# Patient Record
Sex: Female | Born: 1955 | Race: White | Hispanic: No | Smoking: Former smoker
Health system: Southern US, Community
[De-identification: ages and names within clinical notes are randomized; demographics above are authoritative.]

## PROBLEM LIST (undated history)

## (undated) DIAGNOSIS — K635 Polyp of colon: Secondary | ICD-10-CM

## (undated) DIAGNOSIS — G4733 Obstructive sleep apnea (adult) (pediatric): Secondary | ICD-10-CM

## (undated) DIAGNOSIS — G4730 Sleep apnea, unspecified: Secondary | ICD-10-CM

## (undated) DIAGNOSIS — K219 Gastro-esophageal reflux disease without esophagitis: Secondary | ICD-10-CM

## (undated) DIAGNOSIS — G8929 Other chronic pain: Secondary | ICD-10-CM

## (undated) DIAGNOSIS — M1990 Unspecified osteoarthritis, unspecified site: Secondary | ICD-10-CM

## (undated) DIAGNOSIS — B711 Dipylidiasis: Secondary | ICD-10-CM

## (undated) DIAGNOSIS — F419 Anxiety disorder, unspecified: Secondary | ICD-10-CM

## (undated) DIAGNOSIS — E785 Hyperlipidemia, unspecified: Secondary | ICD-10-CM

## (undated) DIAGNOSIS — A071 Giardiasis [lambliasis]: Secondary | ICD-10-CM

## (undated) HISTORY — DX: Gastro-esophageal reflux disease without esophagitis: K21.9

## (undated) HISTORY — DX: Giardiasis (lambliasis): A07.1

## (undated) HISTORY — DX: Anxiety disorder, unspecified: F41.9

## (undated) HISTORY — DX: Dipylidiasis: B71.1

## (undated) HISTORY — PX: REDUCTION MAMMAPLASTY: SUR839

## (undated) HISTORY — PX: LITHOTRIPSY: SUR834

## (undated) HISTORY — DX: Hyperlipidemia, unspecified: E78.5

---

## 1997-11-18 HISTORY — PX: ABDOMINAL HYSTERECTOMY: SHX81

## 2002-06-18 ENCOUNTER — Encounter: Admission: RE | Admit: 2002-06-18 | Discharge: 2002-08-17 | Payer: Self-pay

## 2002-11-18 HISTORY — PX: CHOLECYSTECTOMY: SHX55

## 2004-08-23 ENCOUNTER — Ambulatory Visit: Payer: Self-pay | Admitting: Physician Assistant

## 2004-08-24 ENCOUNTER — Ambulatory Visit: Payer: Self-pay | Admitting: Pain Medicine

## 2004-09-04 ENCOUNTER — Ambulatory Visit: Payer: Self-pay | Admitting: Physician Assistant

## 2004-09-14 ENCOUNTER — Encounter: Payer: Self-pay | Admitting: Pain Medicine

## 2004-09-18 ENCOUNTER — Encounter: Payer: Self-pay | Admitting: Pain Medicine

## 2004-10-02 ENCOUNTER — Ambulatory Visit: Payer: Self-pay | Admitting: Pain Medicine

## 2004-10-09 ENCOUNTER — Ambulatory Visit: Payer: Self-pay | Admitting: Unknown Physician Specialty

## 2004-10-23 ENCOUNTER — Ambulatory Visit: Payer: Self-pay | Admitting: Pain Medicine

## 2004-11-07 ENCOUNTER — Ambulatory Visit: Payer: Self-pay | Admitting: Physician Assistant

## 2004-12-07 ENCOUNTER — Ambulatory Visit: Payer: Self-pay | Admitting: Unknown Physician Specialty

## 2004-12-10 ENCOUNTER — Ambulatory Visit: Payer: Self-pay | Admitting: Physician Assistant

## 2004-12-14 ENCOUNTER — Ambulatory Visit: Payer: Self-pay | Admitting: Unknown Physician Specialty

## 2005-01-10 ENCOUNTER — Ambulatory Visit: Payer: Self-pay | Admitting: Physician Assistant

## 2005-01-15 ENCOUNTER — Ambulatory Visit: Payer: Self-pay | Admitting: Urology

## 2005-02-08 ENCOUNTER — Ambulatory Visit: Payer: Self-pay | Admitting: Physician Assistant

## 2005-02-28 ENCOUNTER — Ambulatory Visit: Payer: Self-pay | Admitting: Physician Assistant

## 2005-03-14 ENCOUNTER — Ambulatory Visit: Payer: Self-pay | Admitting: Pain Medicine

## 2005-03-29 ENCOUNTER — Ambulatory Visit: Payer: Self-pay | Admitting: Physician Assistant

## 2005-04-09 ENCOUNTER — Ambulatory Visit: Payer: Self-pay | Admitting: Pain Medicine

## 2005-04-18 ENCOUNTER — Ambulatory Visit: Payer: Self-pay | Admitting: Pain Medicine

## 2005-04-23 ENCOUNTER — Ambulatory Visit: Payer: Self-pay | Admitting: Pain Medicine

## 2005-05-03 ENCOUNTER — Ambulatory Visit: Payer: Self-pay | Admitting: Physician Assistant

## 2005-05-14 ENCOUNTER — Ambulatory Visit: Payer: Self-pay | Admitting: Pain Medicine

## 2005-06-06 ENCOUNTER — Ambulatory Visit: Payer: Self-pay | Admitting: Physician Assistant

## 2005-07-09 ENCOUNTER — Ambulatory Visit: Payer: Self-pay | Admitting: Physician Assistant

## 2005-08-08 ENCOUNTER — Ambulatory Visit: Payer: Self-pay | Admitting: Physician Assistant

## 2005-09-10 ENCOUNTER — Ambulatory Visit: Payer: Self-pay | Admitting: Physician Assistant

## 2005-10-08 ENCOUNTER — Ambulatory Visit: Payer: Self-pay | Admitting: Physician Assistant

## 2005-10-24 ENCOUNTER — Ambulatory Visit: Payer: Self-pay | Admitting: Pain Medicine

## 2005-11-06 ENCOUNTER — Ambulatory Visit: Payer: Self-pay | Admitting: Physician Assistant

## 2005-11-21 ENCOUNTER — Ambulatory Visit: Payer: Self-pay | Admitting: Pain Medicine

## 2005-12-10 ENCOUNTER — Ambulatory Visit: Payer: Self-pay | Admitting: Pain Medicine

## 2006-01-07 ENCOUNTER — Ambulatory Visit: Payer: Self-pay | Admitting: Physician Assistant

## 2006-02-07 ENCOUNTER — Ambulatory Visit: Payer: Self-pay | Admitting: Physician Assistant

## 2006-03-10 ENCOUNTER — Ambulatory Visit: Payer: Self-pay | Admitting: Physician Assistant

## 2006-03-15 ENCOUNTER — Ambulatory Visit: Payer: Self-pay | Admitting: Physician Assistant

## 2006-04-04 LAB — HM DEXA SCAN

## 2006-04-07 ENCOUNTER — Ambulatory Visit: Payer: Self-pay | Admitting: Physician Assistant

## 2006-05-29 ENCOUNTER — Ambulatory Visit: Payer: Self-pay | Admitting: Physician Assistant

## 2006-06-10 ENCOUNTER — Ambulatory Visit: Payer: Self-pay | Admitting: Physician Assistant

## 2006-07-10 ENCOUNTER — Ambulatory Visit: Payer: Self-pay | Admitting: Physician Assistant

## 2006-08-11 ENCOUNTER — Ambulatory Visit: Payer: Self-pay | Admitting: Physician Assistant

## 2006-08-18 ENCOUNTER — Encounter: Admission: RE | Admit: 2006-08-18 | Discharge: 2006-08-18 | Payer: Self-pay | Admitting: Neurological Surgery

## 2006-09-08 ENCOUNTER — Ambulatory Visit: Payer: Self-pay | Admitting: Physician Assistant

## 2006-10-08 ENCOUNTER — Ambulatory Visit: Payer: Self-pay | Admitting: Physician Assistant

## 2006-11-04 ENCOUNTER — Ambulatory Visit: Payer: Self-pay | Admitting: Physician Assistant

## 2006-12-08 ENCOUNTER — Ambulatory Visit: Payer: Self-pay | Admitting: Physician Assistant

## 2006-12-23 ENCOUNTER — Ambulatory Visit: Payer: Self-pay | Admitting: Pain Medicine

## 2007-01-06 ENCOUNTER — Ambulatory Visit: Payer: Self-pay | Admitting: Physician Assistant

## 2007-01-13 ENCOUNTER — Ambulatory Visit: Payer: Self-pay | Admitting: Pain Medicine

## 2007-02-04 ENCOUNTER — Ambulatory Visit: Payer: Self-pay | Admitting: Physician Assistant

## 2007-02-17 ENCOUNTER — Ambulatory Visit: Payer: Self-pay | Admitting: Pain Medicine

## 2007-03-03 ENCOUNTER — Ambulatory Visit: Payer: Self-pay | Admitting: Pain Medicine

## 2007-04-01 ENCOUNTER — Ambulatory Visit: Payer: Self-pay | Admitting: Physician Assistant

## 2007-05-11 ENCOUNTER — Ambulatory Visit: Payer: Self-pay | Admitting: Physician Assistant

## 2007-06-09 ENCOUNTER — Ambulatory Visit: Payer: Self-pay | Admitting: Physician Assistant

## 2007-07-08 ENCOUNTER — Ambulatory Visit: Payer: Self-pay | Admitting: Physician Assistant

## 2007-08-10 ENCOUNTER — Ambulatory Visit: Payer: Self-pay | Admitting: Physician Assistant

## 2007-09-07 ENCOUNTER — Ambulatory Visit: Payer: Self-pay | Admitting: Unknown Physician Specialty

## 2007-09-10 ENCOUNTER — Ambulatory Visit: Payer: Self-pay | Admitting: Physician Assistant

## 2007-10-08 ENCOUNTER — Ambulatory Visit: Payer: Self-pay | Admitting: Physician Assistant

## 2007-11-05 ENCOUNTER — Ambulatory Visit: Payer: Self-pay | Admitting: Physician Assistant

## 2007-12-10 ENCOUNTER — Ambulatory Visit: Payer: Self-pay | Admitting: Physician Assistant

## 2008-02-04 ENCOUNTER — Ambulatory Visit: Payer: Self-pay | Admitting: Physician Assistant

## 2008-05-05 ENCOUNTER — Ambulatory Visit: Payer: Self-pay | Admitting: Physician Assistant

## 2008-07-27 ENCOUNTER — Ambulatory Visit: Payer: Self-pay | Admitting: Physician Assistant

## 2008-09-15 ENCOUNTER — Ambulatory Visit: Payer: Self-pay | Admitting: Physician Assistant

## 2008-10-12 ENCOUNTER — Ambulatory Visit: Payer: Self-pay | Admitting: Physician Assistant

## 2008-10-16 ENCOUNTER — Emergency Department: Payer: Self-pay | Admitting: Emergency Medicine

## 2008-10-25 ENCOUNTER — Ambulatory Visit: Payer: Self-pay | Admitting: Physician Assistant

## 2008-11-08 ENCOUNTER — Ambulatory Visit: Payer: Self-pay | Admitting: Pain Medicine

## 2008-12-01 ENCOUNTER — Ambulatory Visit: Payer: Self-pay | Admitting: Pain Medicine

## 2008-12-07 ENCOUNTER — Ambulatory Visit: Payer: Self-pay | Admitting: Physician Assistant

## 2009-03-01 ENCOUNTER — Ambulatory Visit: Payer: Self-pay | Admitting: Physician Assistant

## 2009-05-12 ENCOUNTER — Ambulatory Visit: Payer: Self-pay | Admitting: Pain Medicine

## 2009-09-05 ENCOUNTER — Ambulatory Visit: Payer: Self-pay | Admitting: Physician Assistant

## 2009-09-27 ENCOUNTER — Ambulatory Visit: Payer: Self-pay

## 2009-11-01 ENCOUNTER — Ambulatory Visit: Payer: Self-pay | Admitting: Physician Assistant

## 2009-12-07 ENCOUNTER — Ambulatory Visit: Payer: Self-pay | Admitting: Physician Assistant

## 2010-01-22 ENCOUNTER — Ambulatory Visit: Payer: Self-pay | Admitting: Pain Medicine

## 2010-02-01 ENCOUNTER — Ambulatory Visit: Payer: Self-pay | Admitting: Pain Medicine

## 2010-02-19 ENCOUNTER — Ambulatory Visit: Payer: Self-pay | Admitting: Pain Medicine

## 2010-06-25 ENCOUNTER — Ambulatory Visit: Payer: Self-pay | Admitting: Otolaryngology

## 2010-07-02 ENCOUNTER — Ambulatory Visit: Payer: Self-pay | Admitting: Otolaryngology

## 2010-11-29 ENCOUNTER — Ambulatory Visit: Payer: Self-pay | Admitting: Family Medicine

## 2011-12-10 ENCOUNTER — Ambulatory Visit: Payer: Self-pay | Admitting: Otolaryngology

## 2011-12-10 DIAGNOSIS — G4737 Central sleep apnea in conditions classified elsewhere: Secondary | ICD-10-CM | POA: Diagnosis not present

## 2011-12-10 DIAGNOSIS — G4700 Insomnia, unspecified: Secondary | ICD-10-CM | POA: Diagnosis not present

## 2011-12-10 DIAGNOSIS — G4733 Obstructive sleep apnea (adult) (pediatric): Secondary | ICD-10-CM | POA: Diagnosis not present

## 2011-12-24 ENCOUNTER — Ambulatory Visit: Payer: Self-pay | Admitting: Family Medicine

## 2011-12-24 DIAGNOSIS — Z1231 Encounter for screening mammogram for malignant neoplasm of breast: Secondary | ICD-10-CM | POA: Diagnosis not present

## 2012-01-23 DIAGNOSIS — J111 Influenza due to unidentified influenza virus with other respiratory manifestations: Secondary | ICD-10-CM | POA: Diagnosis not present

## 2012-02-05 ENCOUNTER — Ambulatory Visit: Payer: Self-pay | Admitting: Family Medicine

## 2012-02-05 DIAGNOSIS — R05 Cough: Secondary | ICD-10-CM | POA: Diagnosis not present

## 2012-02-05 DIAGNOSIS — Z0000 Encounter for general adult medical examination without abnormal findings: Secondary | ICD-10-CM | POA: Diagnosis not present

## 2012-02-05 DIAGNOSIS — R5381 Other malaise: Secondary | ICD-10-CM | POA: Diagnosis not present

## 2012-02-05 DIAGNOSIS — E785 Hyperlipidemia, unspecified: Secondary | ICD-10-CM | POA: Diagnosis not present

## 2012-02-05 DIAGNOSIS — J209 Acute bronchitis, unspecified: Secondary | ICD-10-CM | POA: Diagnosis not present

## 2012-02-05 DIAGNOSIS — R5383 Other fatigue: Secondary | ICD-10-CM | POA: Diagnosis not present

## 2012-02-05 DIAGNOSIS — R319 Hematuria, unspecified: Secondary | ICD-10-CM | POA: Diagnosis not present

## 2012-02-05 DIAGNOSIS — R059 Cough, unspecified: Secondary | ICD-10-CM | POA: Diagnosis not present

## 2012-02-05 DIAGNOSIS — M549 Dorsalgia, unspecified: Secondary | ICD-10-CM | POA: Diagnosis not present

## 2012-04-17 DIAGNOSIS — R7401 Elevation of levels of liver transaminase levels: Secondary | ICD-10-CM | POA: Diagnosis not present

## 2012-05-04 DIAGNOSIS — M545 Low back pain, unspecified: Secondary | ICD-10-CM | POA: Diagnosis not present

## 2012-05-04 DIAGNOSIS — Z0000 Encounter for general adult medical examination without abnormal findings: Secondary | ICD-10-CM | POA: Diagnosis not present

## 2012-05-04 DIAGNOSIS — G4730 Sleep apnea, unspecified: Secondary | ICD-10-CM | POA: Diagnosis not present

## 2012-05-04 DIAGNOSIS — M549 Dorsalgia, unspecified: Secondary | ICD-10-CM | POA: Diagnosis not present

## 2012-05-08 DIAGNOSIS — G4737 Central sleep apnea in conditions classified elsewhere: Secondary | ICD-10-CM | POA: Diagnosis not present

## 2012-05-08 DIAGNOSIS — G4733 Obstructive sleep apnea (adult) (pediatric): Secondary | ICD-10-CM | POA: Diagnosis not present

## 2012-05-08 DIAGNOSIS — D1039 Benign neoplasm of other parts of mouth: Secondary | ICD-10-CM | POA: Diagnosis not present

## 2012-05-08 DIAGNOSIS — K1370 Unspecified lesions of oral mucosa: Secondary | ICD-10-CM | POA: Diagnosis not present

## 2012-05-08 DIAGNOSIS — G4700 Insomnia, unspecified: Secondary | ICD-10-CM | POA: Diagnosis not present

## 2012-07-23 DIAGNOSIS — J342 Deviated nasal septum: Secondary | ICD-10-CM | POA: Diagnosis not present

## 2012-07-23 DIAGNOSIS — J343 Hypertrophy of nasal turbinates: Secondary | ICD-10-CM | POA: Diagnosis not present

## 2012-07-23 DIAGNOSIS — G4733 Obstructive sleep apnea (adult) (pediatric): Secondary | ICD-10-CM | POA: Diagnosis not present

## 2012-08-03 DIAGNOSIS — F329 Major depressive disorder, single episode, unspecified: Secondary | ICD-10-CM | POA: Diagnosis not present

## 2012-08-03 DIAGNOSIS — M545 Low back pain, unspecified: Secondary | ICD-10-CM | POA: Diagnosis not present

## 2012-08-03 DIAGNOSIS — F3289 Other specified depressive episodes: Secondary | ICD-10-CM | POA: Diagnosis not present

## 2012-08-03 DIAGNOSIS — F411 Generalized anxiety disorder: Secondary | ICD-10-CM | POA: Diagnosis not present

## 2012-08-03 DIAGNOSIS — Z23 Encounter for immunization: Secondary | ICD-10-CM | POA: Diagnosis not present

## 2012-08-03 DIAGNOSIS — Z0000 Encounter for general adult medical examination without abnormal findings: Secondary | ICD-10-CM | POA: Diagnosis not present

## 2012-11-27 DIAGNOSIS — Z23 Encounter for immunization: Secondary | ICD-10-CM | POA: Diagnosis not present

## 2012-11-27 DIAGNOSIS — R259 Unspecified abnormal involuntary movements: Secondary | ICD-10-CM | POA: Diagnosis not present

## 2012-11-27 DIAGNOSIS — F411 Generalized anxiety disorder: Secondary | ICD-10-CM | POA: Diagnosis not present

## 2012-11-27 DIAGNOSIS — M549 Dorsalgia, unspecified: Secondary | ICD-10-CM | POA: Diagnosis not present

## 2012-11-30 ENCOUNTER — Other Ambulatory Visit: Payer: Self-pay | Admitting: Family Medicine

## 2012-11-30 DIAGNOSIS — K219 Gastro-esophageal reflux disease without esophagitis: Secondary | ICD-10-CM | POA: Diagnosis not present

## 2012-11-30 DIAGNOSIS — F411 Generalized anxiety disorder: Secondary | ICD-10-CM | POA: Diagnosis not present

## 2012-11-30 DIAGNOSIS — E162 Hypoglycemia, unspecified: Secondary | ICD-10-CM | POA: Diagnosis not present

## 2012-11-30 LAB — COMPREHENSIVE METABOLIC PANEL
Albumin: 3.6 g/dL (ref 3.4–5.0)
Alkaline Phosphatase: 69 U/L (ref 50–136)
Anion Gap: 5 — ABNORMAL LOW (ref 7–16)
BUN: 11 mg/dL (ref 7–18)
Bilirubin,Total: 0.3 mg/dL (ref 0.2–1.0)
Calcium, Total: 8.8 mg/dL (ref 8.5–10.1)
Chloride: 110 mmol/L — ABNORMAL HIGH (ref 98–107)
Co2: 29 mmol/L (ref 21–32)
Creatinine: 0.75 mg/dL (ref 0.60–1.30)
EGFR (African American): >60
EGFR (Non-African Amer.): >60
Glucose: 87 mg/dL (ref 65–99)
Osmolality: 286 (ref 275–301)
Potassium: 4.5 mmol/L (ref 3.5–5.1)
SGOT(AST): 22 U/L (ref 15–37)
SGPT (ALT): 24 U/L (ref 12–78)
Sodium: 144 mmol/L (ref 136–145)
Total Protein: 7 g/dL (ref 6.4–8.2)

## 2012-11-30 LAB — CBC WITH DIFFERENTIAL/PLATELET
Basophil #: 0.1 10*3/uL (ref 0.0–0.1)
Basophil %: 0.9 %
Eosinophil #: 0.1 10*3/uL (ref 0.0–0.7)
Eosinophil %: 1.9 %
HCT: 40.6 % (ref 35.0–47.0)
HGB: 13.5 g/dL (ref 12.0–16.0)
Lymphocyte #: 2 10*3/uL (ref 1.0–3.6)
Lymphocyte %: 30.6 %
MCH: 29.8 pg (ref 26.0–34.0)
MCHC: 33.3 g/dL (ref 32.0–36.0)
MCV: 89 fL (ref 80–100)
Monocyte #: 0.4 x10 3/mm (ref 0.2–0.9)
Monocyte %: 6.4 %
Neutrophil #: 3.9 10*3/uL (ref 1.4–6.5)
Neutrophil %: 60.2 %
Platelet: 148 10*3/uL — ABNORMAL LOW (ref 150–440)
RBC: 4.54 10*6/uL (ref 3.80–5.20)
RDW: 12.8 % (ref 11.5–14.5)
WBC: 6.6 10*3/uL (ref 3.6–11.0)

## 2012-11-30 LAB — HEMOGLOBIN A1C: Hemoglobin A1C: 5.8 % (ref 4.2–6.3)

## 2012-11-30 LAB — TSH: Thyroid Stimulating Horm: 1.55 u[IU]/mL

## 2012-12-14 ENCOUNTER — Ambulatory Visit: Payer: Self-pay | Admitting: Unknown Physician Specialty

## 2012-12-14 DIAGNOSIS — F329 Major depressive disorder, single episode, unspecified: Secondary | ICD-10-CM | POA: Diagnosis not present

## 2012-12-14 DIAGNOSIS — Z860100 Personal history of colon polyps, unspecified: Secondary | ICD-10-CM | POA: Diagnosis not present

## 2012-12-14 DIAGNOSIS — G4730 Sleep apnea, unspecified: Secondary | ICD-10-CM | POA: Diagnosis not present

## 2012-12-14 DIAGNOSIS — G8929 Other chronic pain: Secondary | ICD-10-CM | POA: Diagnosis not present

## 2012-12-14 DIAGNOSIS — M549 Dorsalgia, unspecified: Secondary | ICD-10-CM | POA: Diagnosis not present

## 2012-12-14 DIAGNOSIS — K648 Other hemorrhoids: Secondary | ICD-10-CM | POA: Diagnosis not present

## 2012-12-14 DIAGNOSIS — F3289 Other specified depressive episodes: Secondary | ICD-10-CM | POA: Diagnosis not present

## 2012-12-14 DIAGNOSIS — Z885 Allergy status to narcotic agent status: Secondary | ICD-10-CM | POA: Diagnosis not present

## 2012-12-14 DIAGNOSIS — Z79899 Other long term (current) drug therapy: Secondary | ICD-10-CM | POA: Diagnosis not present

## 2012-12-14 DIAGNOSIS — Z888 Allergy status to other drugs, medicaments and biological substances status: Secondary | ICD-10-CM | POA: Diagnosis not present

## 2012-12-14 DIAGNOSIS — Z09 Encounter for follow-up examination after completed treatment for conditions other than malignant neoplasm: Secondary | ICD-10-CM | POA: Diagnosis not present

## 2012-12-14 LAB — HM COLONOSCOPY

## 2013-02-15 ENCOUNTER — Ambulatory Visit: Payer: Self-pay | Admitting: Urology

## 2013-02-15 DIAGNOSIS — Q638 Other specified congenital malformations of kidney: Secondary | ICD-10-CM | POA: Diagnosis not present

## 2013-02-15 DIAGNOSIS — N200 Calculus of kidney: Secondary | ICD-10-CM | POA: Diagnosis not present

## 2013-02-15 DIAGNOSIS — M545 Low back pain, unspecified: Secondary | ICD-10-CM | POA: Diagnosis not present

## 2013-02-15 DIAGNOSIS — N201 Calculus of ureter: Secondary | ICD-10-CM | POA: Diagnosis not present

## 2013-02-16 ENCOUNTER — Ambulatory Visit: Payer: Self-pay | Admitting: Urology

## 2013-02-16 DIAGNOSIS — Q638 Other specified congenital malformations of kidney: Secondary | ICD-10-CM | POA: Diagnosis not present

## 2013-02-16 DIAGNOSIS — R319 Hematuria, unspecified: Secondary | ICD-10-CM | POA: Diagnosis not present

## 2013-02-26 DIAGNOSIS — M545 Low back pain, unspecified: Secondary | ICD-10-CM | POA: Diagnosis not present

## 2013-02-26 DIAGNOSIS — E119 Type 2 diabetes mellitus without complications: Secondary | ICD-10-CM | POA: Diagnosis not present

## 2013-02-26 DIAGNOSIS — E7800 Pure hypercholesterolemia, unspecified: Secondary | ICD-10-CM | POA: Diagnosis not present

## 2013-02-26 DIAGNOSIS — Z23 Encounter for immunization: Secondary | ICD-10-CM | POA: Diagnosis not present

## 2013-04-09 DIAGNOSIS — E785 Hyperlipidemia, unspecified: Secondary | ICD-10-CM | POA: Diagnosis not present

## 2013-04-09 DIAGNOSIS — M545 Low back pain, unspecified: Secondary | ICD-10-CM | POA: Diagnosis not present

## 2013-04-09 DIAGNOSIS — E119 Type 2 diabetes mellitus without complications: Secondary | ICD-10-CM | POA: Diagnosis not present

## 2013-04-09 DIAGNOSIS — R21 Rash and other nonspecific skin eruption: Secondary | ICD-10-CM | POA: Diagnosis not present

## 2013-04-09 DIAGNOSIS — Z23 Encounter for immunization: Secondary | ICD-10-CM | POA: Diagnosis not present

## 2013-05-24 DIAGNOSIS — F3289 Other specified depressive episodes: Secondary | ICD-10-CM | POA: Diagnosis not present

## 2013-05-24 DIAGNOSIS — L989 Disorder of the skin and subcutaneous tissue, unspecified: Secondary | ICD-10-CM | POA: Diagnosis not present

## 2013-05-24 DIAGNOSIS — F411 Generalized anxiety disorder: Secondary | ICD-10-CM | POA: Diagnosis not present

## 2013-05-24 DIAGNOSIS — F329 Major depressive disorder, single episode, unspecified: Secondary | ICD-10-CM | POA: Diagnosis not present

## 2013-05-24 DIAGNOSIS — M549 Dorsalgia, unspecified: Secondary | ICD-10-CM | POA: Diagnosis not present

## 2013-08-02 DIAGNOSIS — L538 Other specified erythematous conditions: Secondary | ICD-10-CM | POA: Diagnosis not present

## 2013-08-02 DIAGNOSIS — L989 Disorder of the skin and subcutaneous tissue, unspecified: Secondary | ICD-10-CM | POA: Diagnosis not present

## 2013-08-02 DIAGNOSIS — D485 Neoplasm of uncertain behavior of skin: Secondary | ICD-10-CM | POA: Diagnosis not present

## 2013-08-23 DIAGNOSIS — L989 Disorder of the skin and subcutaneous tissue, unspecified: Secondary | ICD-10-CM | POA: Diagnosis not present

## 2013-08-23 DIAGNOSIS — F411 Generalized anxiety disorder: Secondary | ICD-10-CM | POA: Diagnosis not present

## 2013-08-23 DIAGNOSIS — F329 Major depressive disorder, single episode, unspecified: Secondary | ICD-10-CM | POA: Diagnosis not present

## 2013-08-23 DIAGNOSIS — Z23 Encounter for immunization: Secondary | ICD-10-CM | POA: Diagnosis not present

## 2013-08-23 DIAGNOSIS — F3289 Other specified depressive episodes: Secondary | ICD-10-CM | POA: Diagnosis not present

## 2013-08-23 DIAGNOSIS — M549 Dorsalgia, unspecified: Secondary | ICD-10-CM | POA: Diagnosis not present

## 2013-11-02 ENCOUNTER — Ambulatory Visit: Payer: Self-pay | Admitting: Family Medicine

## 2013-11-02 DIAGNOSIS — Z1231 Encounter for screening mammogram for malignant neoplasm of breast: Secondary | ICD-10-CM | POA: Diagnosis not present

## 2013-12-22 DIAGNOSIS — L538 Other specified erythematous conditions: Secondary | ICD-10-CM | POA: Diagnosis not present

## 2014-02-28 DIAGNOSIS — F329 Major depressive disorder, single episode, unspecified: Secondary | ICD-10-CM | POA: Diagnosis not present

## 2014-02-28 DIAGNOSIS — E785 Hyperlipidemia, unspecified: Secondary | ICD-10-CM | POA: Diagnosis not present

## 2014-02-28 DIAGNOSIS — M79609 Pain in unspecified limb: Secondary | ICD-10-CM | POA: Diagnosis not present

## 2014-02-28 DIAGNOSIS — Z23 Encounter for immunization: Secondary | ICD-10-CM | POA: Diagnosis not present

## 2014-02-28 DIAGNOSIS — F3289 Other specified depressive episodes: Secondary | ICD-10-CM | POA: Diagnosis not present

## 2014-02-28 DIAGNOSIS — M545 Low back pain, unspecified: Secondary | ICD-10-CM | POA: Diagnosis not present

## 2014-06-06 DIAGNOSIS — M545 Low back pain, unspecified: Secondary | ICD-10-CM | POA: Diagnosis not present

## 2014-06-06 DIAGNOSIS — R748 Abnormal levels of other serum enzymes: Secondary | ICD-10-CM | POA: Diagnosis not present

## 2014-06-06 DIAGNOSIS — F329 Major depressive disorder, single episode, unspecified: Secondary | ICD-10-CM | POA: Diagnosis not present

## 2014-06-06 DIAGNOSIS — Z23 Encounter for immunization: Secondary | ICD-10-CM | POA: Diagnosis not present

## 2014-06-06 DIAGNOSIS — E785 Hyperlipidemia, unspecified: Secondary | ICD-10-CM | POA: Diagnosis not present

## 2014-06-06 DIAGNOSIS — F3289 Other specified depressive episodes: Secondary | ICD-10-CM | POA: Diagnosis not present

## 2014-06-23 DIAGNOSIS — E785 Hyperlipidemia, unspecified: Secondary | ICD-10-CM | POA: Diagnosis not present

## 2014-06-23 DIAGNOSIS — F329 Major depressive disorder, single episode, unspecified: Secondary | ICD-10-CM | POA: Diagnosis not present

## 2014-06-23 DIAGNOSIS — F3289 Other specified depressive episodes: Secondary | ICD-10-CM | POA: Diagnosis not present

## 2014-06-23 LAB — LIPID PANEL
Cholesterol: 184 mg/dL (ref 0–200)
HDL: 36 mg/dL (ref 35–70)
LDL Cholesterol: 100 mg/dL
Triglycerides: 238 mg/dL — AB (ref 40–160)

## 2014-06-23 LAB — CBC AND DIFFERENTIAL
HCT: 43 % (ref 36–46)
Hemoglobin: 14.7 g/dL (ref 12.0–16.0)
Platelets: 166 10*3/uL (ref 150–399)
WBC: 6.2 10^3/mL

## 2014-06-23 LAB — BASIC METABOLIC PANEL
BUN: 12 mg/dL (ref 4–21)
Creatinine: 0.8 mg/dL (ref <=1.1)
Glucose: 91 mg/dL
Potassium: 4.4 mmol/L (ref 3.4–5.3)
Sodium: 143 mmol/L (ref 137–147)

## 2014-06-23 LAB — HEPATIC FUNCTION PANEL
ALT: 19 U/L (ref 7–35)
AST: 21 U/L (ref 13–35)

## 2014-06-23 LAB — TSH: TSH: 2.35 u[IU]/mL (ref <=5.90)

## 2014-08-03 DIAGNOSIS — L919 Hypertrophic disorder of the skin, unspecified: Secondary | ICD-10-CM | POA: Diagnosis not present

## 2014-08-03 DIAGNOSIS — L538 Other specified erythematous conditions: Secondary | ICD-10-CM | POA: Diagnosis not present

## 2014-08-03 DIAGNOSIS — L821 Other seborrheic keratosis: Secondary | ICD-10-CM | POA: Diagnosis not present

## 2014-08-03 DIAGNOSIS — L909 Atrophic disorder of skin, unspecified: Secondary | ICD-10-CM | POA: Diagnosis not present

## 2014-09-05 DIAGNOSIS — Z6837 Body mass index (BMI) 37.0-37.9, adult: Secondary | ICD-10-CM | POA: Diagnosis not present

## 2014-09-05 DIAGNOSIS — M519 Unspecified thoracic, thoracolumbar and lumbosacral intervertebral disc disorder: Secondary | ICD-10-CM | POA: Diagnosis not present

## 2014-09-05 DIAGNOSIS — Z23 Encounter for immunization: Secondary | ICD-10-CM | POA: Diagnosis not present

## 2014-09-05 DIAGNOSIS — Z1389 Encounter for screening for other disorder: Secondary | ICD-10-CM | POA: Diagnosis not present

## 2014-09-05 DIAGNOSIS — E785 Hyperlipidemia, unspecified: Secondary | ICD-10-CM | POA: Diagnosis not present

## 2014-11-14 ENCOUNTER — Ambulatory Visit: Payer: Self-pay | Admitting: Family Medicine

## 2014-11-14 DIAGNOSIS — Z1231 Encounter for screening mammogram for malignant neoplasm of breast: Secondary | ICD-10-CM | POA: Diagnosis not present

## 2014-11-14 LAB — HM MAMMOGRAPHY

## 2014-12-05 DIAGNOSIS — Z6837 Body mass index (BMI) 37.0-37.9, adult: Secondary | ICD-10-CM | POA: Diagnosis not present

## 2014-12-05 DIAGNOSIS — M25551 Pain in right hip: Secondary | ICD-10-CM | POA: Diagnosis not present

## 2014-12-05 DIAGNOSIS — M549 Dorsalgia, unspecified: Secondary | ICD-10-CM | POA: Diagnosis not present

## 2015-03-06 DIAGNOSIS — E669 Obesity, unspecified: Secondary | ICD-10-CM | POA: Diagnosis not present

## 2015-03-06 DIAGNOSIS — E785 Hyperlipidemia, unspecified: Secondary | ICD-10-CM | POA: Diagnosis not present

## 2015-03-06 DIAGNOSIS — M545 Low back pain: Secondary | ICD-10-CM | POA: Diagnosis not present

## 2015-03-06 DIAGNOSIS — Z720 Tobacco use: Secondary | ICD-10-CM | POA: Diagnosis not present

## 2015-03-28 DIAGNOSIS — Z720 Tobacco use: Secondary | ICD-10-CM | POA: Diagnosis not present

## 2015-03-28 DIAGNOSIS — B029 Zoster without complications: Secondary | ICD-10-CM | POA: Diagnosis not present

## 2015-03-28 DIAGNOSIS — E669 Obesity, unspecified: Secondary | ICD-10-CM | POA: Diagnosis not present

## 2015-03-28 DIAGNOSIS — E785 Hyperlipidemia, unspecified: Secondary | ICD-10-CM | POA: Diagnosis not present

## 2015-03-30 DIAGNOSIS — B029 Zoster without complications: Secondary | ICD-10-CM | POA: Diagnosis not present

## 2015-04-05 DIAGNOSIS — K219 Gastro-esophageal reflux disease without esophagitis: Secondary | ICD-10-CM | POA: Insufficient documentation

## 2015-04-05 DIAGNOSIS — E785 Hyperlipidemia, unspecified: Secondary | ICD-10-CM | POA: Insufficient documentation

## 2015-04-05 DIAGNOSIS — G4730 Sleep apnea, unspecified: Secondary | ICD-10-CM | POA: Insufficient documentation

## 2015-04-05 DIAGNOSIS — F17210 Nicotine dependence, cigarettes, uncomplicated: Secondary | ICD-10-CM | POA: Insufficient documentation

## 2015-04-05 DIAGNOSIS — L920 Granuloma annulare: Secondary | ICD-10-CM | POA: Insufficient documentation

## 2015-04-05 DIAGNOSIS — F0631 Mood disorder due to known physiological condition with depressive features: Secondary | ICD-10-CM | POA: Insufficient documentation

## 2015-04-05 DIAGNOSIS — R748 Abnormal levels of other serum enzymes: Secondary | ICD-10-CM | POA: Insufficient documentation

## 2015-04-05 DIAGNOSIS — M549 Dorsalgia, unspecified: Secondary | ICD-10-CM

## 2015-04-05 DIAGNOSIS — K635 Polyp of colon: Secondary | ICD-10-CM | POA: Insufficient documentation

## 2015-04-05 DIAGNOSIS — R251 Tremor, unspecified: Secondary | ICD-10-CM | POA: Insufficient documentation

## 2015-04-05 DIAGNOSIS — F419 Anxiety disorder, unspecified: Secondary | ICD-10-CM | POA: Insufficient documentation

## 2015-04-05 DIAGNOSIS — E162 Hypoglycemia, unspecified: Secondary | ICD-10-CM | POA: Insufficient documentation

## 2015-04-05 DIAGNOSIS — M25559 Pain in unspecified hip: Secondary | ICD-10-CM | POA: Insufficient documentation

## 2015-04-05 DIAGNOSIS — Z87442 Personal history of urinary calculi: Secondary | ICD-10-CM | POA: Insufficient documentation

## 2015-04-05 DIAGNOSIS — G4733 Obstructive sleep apnea (adult) (pediatric): Secondary | ICD-10-CM | POA: Insufficient documentation

## 2015-04-05 DIAGNOSIS — Z6834 Body mass index (BMI) 34.0-34.9, adult: Secondary | ICD-10-CM | POA: Insufficient documentation

## 2015-04-05 DIAGNOSIS — G8929 Other chronic pain: Secondary | ICD-10-CM | POA: Insufficient documentation

## 2015-04-05 DIAGNOSIS — Z87891 Personal history of nicotine dependence: Secondary | ICD-10-CM | POA: Insufficient documentation

## 2015-04-05 DIAGNOSIS — B029 Zoster without complications: Secondary | ICD-10-CM | POA: Insufficient documentation

## 2015-04-05 DIAGNOSIS — R609 Edema, unspecified: Secondary | ICD-10-CM | POA: Insufficient documentation

## 2015-04-05 DIAGNOSIS — K5909 Other constipation: Secondary | ICD-10-CM | POA: Insufficient documentation

## 2015-04-05 DIAGNOSIS — K648 Other hemorrhoids: Secondary | ICD-10-CM | POA: Insufficient documentation

## 2015-04-05 HISTORY — DX: Polyp of colon: K63.5

## 2015-05-02 ENCOUNTER — Other Ambulatory Visit: Payer: Self-pay | Admitting: Family Medicine

## 2015-05-02 NOTE — Telephone Encounter (Signed)
Printed, please fax. Thanks to pharmacy.  [facility]

## 2015-06-02 ENCOUNTER — Other Ambulatory Visit: Payer: Self-pay | Admitting: Family Medicine

## 2015-06-02 DIAGNOSIS — F419 Anxiety disorder, unspecified: Secondary | ICD-10-CM

## 2015-06-07 ENCOUNTER — Other Ambulatory Visit: Payer: Self-pay | Admitting: Family Medicine

## 2015-06-07 ENCOUNTER — Ambulatory Visit (INDEPENDENT_AMBULATORY_CARE_PROVIDER_SITE_OTHER): Payer: Medicare Other | Admitting: Family Medicine

## 2015-06-07 ENCOUNTER — Encounter: Payer: Self-pay | Admitting: Family Medicine

## 2015-06-07 VITALS — BP 118/80 | HR 84 | Temp 98.2°F | Resp 16 | Ht 64.0 in | Wt 217.0 lb

## 2015-06-07 DIAGNOSIS — G8929 Other chronic pain: Secondary | ICD-10-CM | POA: Diagnosis not present

## 2015-06-07 DIAGNOSIS — E162 Hypoglycemia, unspecified: Secondary | ICD-10-CM | POA: Diagnosis not present

## 2015-06-07 DIAGNOSIS — F419 Anxiety disorder, unspecified: Secondary | ICD-10-CM | POA: Diagnosis not present

## 2015-06-07 DIAGNOSIS — M549 Dorsalgia, unspecified: Secondary | ICD-10-CM

## 2015-06-07 DIAGNOSIS — R748 Abnormal levels of other serum enzymes: Secondary | ICD-10-CM

## 2015-06-07 DIAGNOSIS — E785 Hyperlipidemia, unspecified: Secondary | ICD-10-CM | POA: Diagnosis not present

## 2015-06-07 MED ORDER — HYDROCODONE-ACETAMINOPHEN 5-325 MG PO TABS: 1.0000 | ORAL_TABLET | Freq: Four times a day (QID) | ORAL | Status: DC | PRN

## 2015-06-07 MED ORDER — MORPHINE SULFATE ER 20 MG PO CP24: 20.0000 mg | ORAL_CAPSULE | Freq: Two times a day (BID) | ORAL | Status: DC

## 2015-06-07 MED ORDER — MORPHINE SULFATE ER 20 MG PO CP24
20.0000 mg | ORAL_CAPSULE | Freq: Two times a day (BID) | ORAL | Status: DC
Start: 2015-06-07 — End: 2015-09-07

## 2015-06-07 NOTE — Progress Notes (Signed)
Patient ID: Alejandra Little, female   DOB: [birth year], 59 y.o.   MRN: [id number]       Patient: Alejandra Little, Female    DOB: 02-06-1956, 59 y.o.   MRN: [id number] Visit Date: 06/08/2015  Today's Provider: Margarita Rana, MD   Chief Complaint  Patient presents with  . Annual Exam   Subjective:    Annual wellness visit Alejandra Little is a 59 y.o. female who presents today for her Subsequent Annual Wellness Visit. She feels well. She reports exercising ocassionally. She reports she is sleeping well.  -----------------------------------------------------------   Review of Systems  Constitutional: Negative.   HENT: Negative.   Eyes: Negative.   Respiratory: Negative.   Cardiovascular: Negative.   Gastrointestinal: Negative.   Endocrine: Negative.   Genitourinary: Negative.   Musculoskeletal: Positive for back pain.  Skin: Negative.   Allergic/Immunologic: Negative.   Neurological: Negative.   Hematological: Negative.   Psychiatric/Behavioral: Negative.     History   Social History  . Marital Status: Single    Spouse Name: N/A  . Number of Children: N/A  . Years of Education: N/A   Occupational History  . Not on file.   Social History Main Topics  . Smoking status: Current Every Day Smoker -- 1.00 packs/day  . Smokeless tobacco: Not on file  . Alcohol Use: No  . Drug Use: No  . Sexual Activity: Not on file   Other Topics Concern  . Not on file   Social History Narrative    Patient Active Problem List   Diagnosis Date Noted  . Anxiety 04/05/2015  . Adult BMI 30+ 04/05/2015  . Back pain, chronic 04/05/2015  . Chronic constipation 04/05/2015  . Colon polyp 04/05/2015  . Current tobacco use 04/05/2015  . Abnormal liver enzymes 04/05/2015  . Acid reflux 04/05/2015  . 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 Arthralgia of hip 04/05/2015  . H/O renal calculi 04/05/2015  . HLD (hyperlipidemia) 04/05/2015  . Hypoglycemia 04/05/2015  . Hemorrhoids, internal  04/05/2015  . 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 Edema, peripheral 04/05/2015  . Apnea, sleep 04/05/2015  . Has a tremor 04/05/2015  . Herpes zona 04/05/2015    Past Surgical History  Procedure Laterality Date  . Abdominal hysterectomy  1999    Fibroids  . Cholecystectomy  2004    Her family history includes COPD in her sister; Hypertension in her sister; Lung cancer in her father and mother.    Previous Medications   ALPRAZOLAM (XANAX) 0.5 MG TABLET    TAKE 1 TABLET BY MOUTH AT BEDTIME   ESOMEPRAZOLE (NEXIUM) 40 MG CAPSULE    Take by mouth.   FLUOXETINE (PROZAC) 20 MG CAPSULE    TAKE 3 CAPSULES BY MOUTH EVERY DAY   GABAPENTIN (NEURONTIN) 300 MG CAPSULE    Take by mouth.   PRAVASTATIN (PRAVACHOL) 40 MG TABLET    Take by mouth.    Patient Care Team: Margarita Rana, MD as PCP - General (Family Medicine)     Objective:   Vitals: BP 118/80 mmHg  Pulse 84  Temp(Src) 98.2 F (36.8 C)  Resp 16  Ht 5\' 4"  (1.626 m)  Wt 217 lb (98.431 kg)  BMI 37.23 kg/m2  Physical Exam  Constitutional: She is oriented to person, place, and time. She appears well-developed and well-nourished.  Neck: Normal range of motion. Neck supple.  Cardiovascular: Normal rate, regular rhythm, normal heart sounds and intact distal pulses.  Pulmonary/Chest: Effort normal and breath sounds normal.  Abdominal: Soft. Bowel sounds are normal.  Musculoskeletal: Normal range of motion.  Neurological: She is alert and oriented to person, place, and time. She has normal reflexes.  Nursing note and vitals reviewed.   Activities of Daily Living In your present state of health, do you have any difficulty performing the following activities: 06/08/2015 06/07/2015  Hearing? N N  Vision? N N  Difficulty concentrating or making decisions? N N  Walking or climbing stairs? N N  Dressing or bathing? N N  Doing errands, shopping? N N    Fall Risk Assessment Fall  Risk  06/08/2015 06/07/2015  Falls in the past year? No No     Depression Screen PHQ 2/9 Scores 06/08/2015 06/07/2015  PHQ - 2 Score 0 4         Assessment & Plan:     Annual Wellness Visit  Reviewed patient's Family Medical History Reviewed and updated list of patient's medical providers Assessed patient's functional ability Established a written schedule for health screening [place] Completed and Reviewed  Exercise Activities and Dietary recommendations Goals    None       There is no immunization history on file for this patient.  Health Maintenance  Topic Date Due  . HIV Screening  10/21/1969  . PAP SMEAR  10/21/1972  . TETANUS/TDAP  10/21/1973  . ZOSTAVAX  10/21/2014  . INFLUENZA VACCINE  06/19/2015  . MAMMOGRAM  11/14/2016  . COLONOSCOPY  12/14/2022      Discussed health benefits of physical activity, and encouraged her to engage in regular exercise appropriate for her age and condition.    ------------------------------------------------------------------------------------------------------------  1. Hypoglycemia Will check labs.   - Hemoglobin A1c  2. HLD (hyperlipidemia) Will check labs.  - Lipid panel  3. Abnormal liver enzymes Will check labs.  - Comprehensive metabolic panel  4. Anxiety Fairly stable. Continue medication.  - TSH  5. Back pain, chronic Will continue current medication. Not make any changes at this time.  - CBC with Differential/Platelet - morphine (KADIAN) 20 MG 24 hr capsule; Take 1 capsule (20 mg total) by mouth 2 (two) times daily. To be filled after 08/08/15  Dispense: 60 capsule; Refill: 0 - HYDROcodone-acetaminophen (NORCO/VICODIN) 5-325 MG per tablet; Take 1 tablet by mouth every 6 (six) hours as needed for moderate pain. To be filled after 08/08/15  Dispense: 60 tablet; Refill: 0 - Pain Management Screening Profile (10S)  Margarita Rana, MD

## 2015-06-07 NOTE — Progress Notes (Deleted)
   Subjective:    Patient ID: Alejandra Little, female    DOB: [birth year], 59 y.o.   MRN: [id number]  HPI    Review of Systems     Objective:   Physical Exam        Assessment & Plan:

## 2015-06-08 ENCOUNTER — Telehealth: Payer: Self-pay

## 2015-06-08 LAB — CBC WITH DIFFERENTIAL/PLATELET
Basophils Absolute: 0 10*3/uL (ref 0.0–0.2)
Basos: 0 %
EOS (ABSOLUTE): 0.1 10*3/uL (ref 0.0–0.4)
Eos: 2 %
Hematocrit: 43.7 % (ref 34.0–46.6)
Hemoglobin: 14.2 g/dL (ref 11.1–15.9)
Immature Grans (Abs): 0 10*3/uL (ref 0.0–0.1)
Immature Granulocytes: 0 %
Lymphocytes Absolute: 2.6 10*3/uL (ref 0.7–3.1)
Lymphs: 37 %
MCH: 29.6 pg (ref 26.6–33.0)
MCHC: 32.5 g/dL (ref 31.5–35.7)
MCV: 91 fL (ref 79–97)
Monocytes Absolute: 0.5 10*3/uL (ref 0.1–0.9)
Monocytes: 7 %
Neutrophils Absolute: 3.8 10*3/uL (ref 1.4–7.0)
Neutrophils: 54 %
Platelets: 173 10*3/uL (ref 150–379)
RBC: 4.8 x10E6/uL (ref 3.77–5.28)
RDW: 13.2 % (ref 12.3–15.4)
WBC: 7 10*3/uL (ref 3.4–10.8)

## 2015-06-08 LAB — COMPREHENSIVE METABOLIC PANEL
ALT: 16 IU/L (ref 0–32)
AST: 16 IU/L (ref 0–40)
Albumin/Globulin Ratio: 1.7 (ref 1.1–2.5)
Albumin: 4.1 g/dL (ref 3.6–4.8)
Alkaline Phosphatase: 70 IU/L (ref 39–117)
BUN/Creatinine Ratio: 16 (ref 11–26)
BUN: 12 mg/dL (ref 8–27)
Bilirubin Total: 0.3 mg/dL (ref 0.0–1.2)
CO2: 27 mmol/L (ref 18–29)
Calcium: 9.3 mg/dL (ref 8.7–10.3)
Chloride: 101 mmol/L (ref 97–108)
Creatinine, Ser: 0.77 mg/dL (ref 0.57–1.00)
GFR calc Af Amer: 97 mL/min/{1.73_m2} (ref >59)
GFR calc non Af Amer: 84 mL/min/{1.73_m2} (ref >59)
Globulin, Total: 2.4 g/dL (ref 1.5–4.5)
Glucose: 84 mg/dL (ref 65–99)
Potassium: 5 mmol/L (ref 3.5–5.2)
Sodium: 145 mmol/L — ABNORMAL HIGH (ref 134–144)
Total Protein: 6.5 g/dL (ref 6.0–8.5)

## 2015-06-08 LAB — LIPID PANEL
Chol/HDL Ratio: 6 ratio units — ABNORMAL HIGH (ref 0.0–4.4)
Cholesterol, Total: 251 mg/dL — ABNORMAL HIGH (ref 100–199)
HDL: 42 mg/dL (ref >39)
LDL Calculated: 175 mg/dL — ABNORMAL HIGH (ref 0–99)
Triglycerides: 170 mg/dL — ABNORMAL HIGH (ref 0–149)
VLDL Cholesterol Cal: 34 mg/dL (ref 5–40)

## 2015-06-08 LAB — TSH: TSH: 1.56 u[IU]/mL (ref 0.450–4.500)

## 2015-06-08 LAB — HEMOGLOBIN A1C
Est. average glucose Bld gHb Est-mCnc: 117 mg/dL
Hgb A1c MFr Bld: 5.7 % — ABNORMAL HIGH (ref 4.8–5.6)

## 2015-06-08 NOTE — Telephone Encounter (Signed)
-----   Message from Margarita Rana, MD sent at 06/08/2015  1:18 PM EDT ----- Labs stable. Blood sugar looks good. Cholesterol is still high on medication. Please see if patient would like to continue current medication.   May have more side effects to other medication.   Thanks.

## 2015-06-08 NOTE — Telephone Encounter (Signed)
Pt advised.  She reports that she has not been taking her pravastatin for awhile.  She tolerated it fine, she just took a "vacation" from it.  She says she will restart it today.   Thanks,   -Mickel Baas

## 2015-06-09 LAB — PMP SCREEN PROFILE (10S), URINE
Amphetamine Screen, Ur: NEGATIVE ng/mL
Barbiturate Screen, Ur: NEGATIVE ng/mL
Benzodiazepine Screen, Urine: NEGATIVE ng/mL
Cannabinoids Ur Ql Scn: NEGATIVE ng/mL
Cocaine(Metab.)Screen, Urine: NEGATIVE ng/mL
Creatinine(Crt), U: 198.8 mg/dL (ref 20.0–300.0)
Methadone Scn, Ur: NEGATIVE ng/mL
Opiate Scrn, Ur: POSITIVE ng/mL
Oxycodone+Oxymorphone Ur Ql Scn: NEGATIVE ng/mL
PCP Scrn, Ur: NEGATIVE ng/mL
Ph of Urine: 5.8 (ref 4.5–8.9)
Propoxyphene, Screen: NEGATIVE ng/mL

## 2015-06-09 NOTE — Progress Notes (Signed)
Pt informed of lab results. bb

## 2015-06-14 DIAGNOSIS — H04123 Dry eye syndrome of bilateral lacrimal glands: Secondary | ICD-10-CM | POA: Diagnosis not present

## 2015-06-29 ENCOUNTER — Other Ambulatory Visit: Payer: Self-pay | Admitting: Family Medicine

## 2015-06-29 DIAGNOSIS — G8929 Other chronic pain: Secondary | ICD-10-CM

## 2015-06-29 DIAGNOSIS — M549 Dorsalgia, unspecified: Principal | ICD-10-CM

## 2015-08-24 ENCOUNTER — Other Ambulatory Visit: Payer: Self-pay | Admitting: Family Medicine

## 2015-08-24 DIAGNOSIS — E785 Hyperlipidemia, unspecified: Secondary | ICD-10-CM

## 2015-08-24 DIAGNOSIS — K219 Gastro-esophageal reflux disease without esophagitis: Secondary | ICD-10-CM

## 2015-09-07 ENCOUNTER — Telehealth: Payer: Self-pay | Admitting: Family Medicine

## 2015-09-07 ENCOUNTER — Encounter: Payer: Self-pay | Admitting: Family Medicine

## 2015-09-07 ENCOUNTER — Ambulatory Visit (INDEPENDENT_AMBULATORY_CARE_PROVIDER_SITE_OTHER): Payer: Medicare Other | Admitting: Family Medicine

## 2015-09-07 VITALS — BP 110/72 | HR 84 | Temp 97.9°F | Resp 16

## 2015-09-07 DIAGNOSIS — G8929 Other chronic pain: Secondary | ICD-10-CM

## 2015-09-07 DIAGNOSIS — E162 Hypoglycemia, unspecified: Secondary | ICD-10-CM | POA: Diagnosis not present

## 2015-09-07 DIAGNOSIS — M549 Dorsalgia, unspecified: Secondary | ICD-10-CM | POA: Diagnosis not present

## 2015-09-07 DIAGNOSIS — L989 Disorder of the skin and subcutaneous tissue, unspecified: Secondary | ICD-10-CM | POA: Diagnosis not present

## 2015-09-07 DIAGNOSIS — E785 Hyperlipidemia, unspecified: Secondary | ICD-10-CM

## 2015-09-07 DIAGNOSIS — Z23 Encounter for immunization: Secondary | ICD-10-CM

## 2015-09-07 MED ORDER — HYDROCODONE-ACETAMINOPHEN 5-325 MG PO TABS: 1.0000 | ORAL_TABLET | Freq: Four times a day (QID) | ORAL | Status: DC | PRN

## 2015-09-07 MED ORDER — MORPHINE SULFATE ER 20 MG PO CP24: 20.0000 mg | ORAL_CAPSULE | Freq: Two times a day (BID) | ORAL | Status: DC

## 2015-09-07 NOTE — Telephone Encounter (Signed)
Pt states that she was in office today and was suppose to be referred to a dermatologist.Please add order to [facility]

## 2015-09-07 NOTE — Telephone Encounter (Signed)
Put in order. Chart was not complete.   Wants to see Dr. [facility] on [address]  Thanks.

## 2015-09-07 NOTE — Progress Notes (Signed)
Patient ID: Alejandra Little, female   DOB: 1956-02-24, 59 y.o.   MRN: [id number]        Patient: Alejandra Little Female    DOB: 1955/12/30   59 y.o.   MRN: [id number] Visit Date: 09/07/2015  Today's Provider: Margarita Rana, MD   Chief Complaint  Patient presents with  . Hyperglycemia  . Hyperlipidemia  . Back Pain   Subjective:    HPI   Follow up for hypoglycemia  The patient was last seen for this 3 months ago. Changes made at last visit include none.  She reports good compliance with treatment. She feels that condition is Unchanged. She is not having side effects.   ------------------------------------------------------------------------------------ Follow up for hyperlipidemia  The patient was last seen for this 3 months ago. Changes made at last visit include none.  She reports fair compliance with treatment. She feels that condition is Unchanged. She is not having side effects.     ------------------------------------------------------------------------------------  Follow up for chronic back pain  The patient was last seen for this 3 months ago. Changes made at last visit include none, pt to continue current medications.  She reports excellent compliance with treatment. She feels that condition is Unchanged. She is not having side effects.    Skin lesion on right abdomen.  Some erythema around it.     No Known Allergies Previous Medications   ALPRAZOLAM (XANAX) 0.5 MG TABLET    TAKE 1 TABLET BY MOUTH AT BEDTIME   FLUOXETINE (PROZAC) 20 MG CAPSULE    TAKE 3 CAPSULES BY MOUTH EVERY DAY   GABAPENTIN (NEURONTIN) 300 MG CAPSULE    TAKE ONE CAPSULE BY MOUTH 3 TIMES A DAY AND TAKE 2 CAPSULES BY MOUTH AT BEDTIME **INCREASE SLOWLY**   NEXIUM 40 MG CAPSULE    TAKE ONE CAPSULE BY MOUTH ONCE A DAY   PRAVASTATIN (PRAVACHOL) 40 MG TABLET    TAKE 1 TABLET BY MOUTH AT BEDTIME    Review of Systems  Constitutional: Negative for activity change and appetite change.    Respiratory: Negative for apnea and chest tightness.   Musculoskeletal: Positive for back pain and arthralgias.  Psychiatric/Behavioral: Negative for behavioral problems and agitation.    Social History  Substance Use Topics  . Smoking status: Current Every Day Smoker -- 1.00 packs/day  . Smokeless tobacco: Never Used  . Alcohol Use: No   Objective:   BP 110/72 mmHg  Pulse 84  Temp(Src) 97.9 F (36.6 C)  Resp 16  SpO2 97%  Physical Exam  Constitutional: She is oriented to person, place, and time. She appears well-developed and well-nourished.  Neurological: She is alert and oriented to person, place, and time.  Skin:  3 mm raised lesion on right lower abdomen.    Psychiatric: She has a normal mood and affect. Her behavior is normal. Judgment and thought content normal.      Assessment & Plan:    1. HLD (hyperlipidemia) Not addressed today.   2. Back pain, chronic Unchanged. Refilled medication for 3 months today.  - morphine (KADIAN) 20 MG 24 hr capsule; Take 1 capsule (20 mg total) by mouth 2 (two) times daily. To be filled after 10/08/15  Dispense: 60 capsule; Refill: 0 - morphine (KADIAN) 20 MG 24 hr capsule; Take 1 capsule (20 mg total) by mouth 2 (two) times daily. To be filled after 11/07/15  Dispense: 60 capsule; Refill: 0 - HYDROcodone-acetaminophen (NORCO/VICODIN) 5-325 MG tablet; Take 1 tablet by mouth every 6 (six) hours  as needed for moderate pain.  Dispense: 60 tablet; Refill: 0 - HYDROcodone-acetaminophen (NORCO/VICODIN) 5-325 MG tablet; Take 1 tablet by mouth every 6 (six) hours as needed for moderate pain. To be filled after 11/07/15  Dispense: 60 tablet; Refill: 0  3. Need for Tdap vaccination Given today.  - Tdap vaccine greater than or equal to 7yo IM  5. Need for influenza vaccination Given today.  - Flu Vaccine QUAD 36+ mos IM  6. Skin lesion With some erythema, will refer.  - Ambulatory referral to Dermatology     Margarita Rana, MD   [facility]

## 2015-09-13 DIAGNOSIS — L918 Other hypertrophic disorders of the skin: Secondary | ICD-10-CM | POA: Diagnosis not present

## 2015-09-13 DIAGNOSIS — L920 Granuloma annulare: Secondary | ICD-10-CM | POA: Diagnosis not present

## 2015-09-13 DIAGNOSIS — D229 Melanocytic nevi, unspecified: Secondary | ICD-10-CM | POA: Diagnosis not present

## 2015-09-13 DIAGNOSIS — D485 Neoplasm of uncertain behavior of skin: Secondary | ICD-10-CM | POA: Diagnosis not present

## 2015-09-13 DIAGNOSIS — L820 Inflamed seborrheic keratosis: Secondary | ICD-10-CM | POA: Diagnosis not present

## 2015-11-02 ENCOUNTER — Other Ambulatory Visit: Payer: Self-pay | Admitting: Family Medicine

## 2015-11-02 DIAGNOSIS — F419 Anxiety disorder, unspecified: Secondary | ICD-10-CM

## 2015-11-03 NOTE — Telephone Encounter (Signed)
Printed, please fax or call in to pharmacy. Thank you.   

## 2015-12-01 ENCOUNTER — Ambulatory Visit (INDEPENDENT_AMBULATORY_CARE_PROVIDER_SITE_OTHER): Payer: Medicare Other | Admitting: Family Medicine

## 2015-12-01 ENCOUNTER — Encounter: Payer: Self-pay | Admitting: Family Medicine

## 2015-12-01 VITALS — BP 100/66 | HR 76 | Temp 98.0°F | Resp 16 | Wt 221.0 lb

## 2015-12-01 DIAGNOSIS — G8929 Other chronic pain: Secondary | ICD-10-CM | POA: Diagnosis not present

## 2015-12-01 DIAGNOSIS — M549 Dorsalgia, unspecified: Secondary | ICD-10-CM | POA: Diagnosis not present

## 2015-12-01 DIAGNOSIS — M5441 Lumbago with sciatica, right side: Secondary | ICD-10-CM | POA: Diagnosis not present

## 2015-12-01 MED ORDER — MORPHINE SULFATE ER 20 MG PO CP24: 20.0000 mg | ORAL_CAPSULE | Freq: Two times a day (BID) | ORAL | Status: DC

## 2015-12-01 MED ORDER — HYDROCODONE-ACETAMINOPHEN 5-325 MG PO TABS: 1.0000 | ORAL_TABLET | Freq: Four times a day (QID) | ORAL | Status: DC | PRN

## 2015-12-01 NOTE — Progress Notes (Signed)
Subjective:    Patient ID: Alejandra Little, female    DOB: 03-23-1956, 60 y.o.   MRN: [id number]  HPI Comments: Pt needs refills on Norco and Morphine.  Back Pain This is a chronic problem. The current episode started more than 1 year ago (since 2001). The problem occurs constantly. The problem has been waxing and waning since onset. The pain is present in the lumbar spine. The quality of the pain is described as burning. The pain radiates to the right thigh. The pain is at a severity of 7/10. The pain is severe. The pain is the same all the time. The symptoms are aggravated by standing and sitting. Associated symptoms include leg pain and weakness. Pertinent negatives include no abdominal pain, bladder incontinence, bowel incontinence, chest pain, dysuria, fever, headaches, numbness, pelvic pain or tingling. She has tried analgesics for the symptoms. The treatment provided significant relief.      Review of Systems  Constitutional: Negative for fever.  Cardiovascular: Negative for chest pain.  Gastrointestinal: Negative for abdominal pain and bowel incontinence.  Genitourinary: Negative for bladder incontinence, dysuria and pelvic pain.  Musculoskeletal: Positive for back pain.  Neurological: Positive for weakness. Negative for tingling, numbness and headaches.   BP 100/66 mmHg  Pulse 76  Temp(Src) 98 F (36.7 C) (Oral)  Resp 16  Wt 221 lb (100.245 kg)   Patient Active Problem List   Diagnosis Date Noted  . Anxiety 04/05/2015  . Adult BMI 30+ 04/05/2015  . Back pain, chronic 04/05/2015  . Chronic constipation 04/05/2015  . Colon polyp 04/05/2015  . Current tobacco use 04/05/2015  . Abnormal liver enzymes 04/05/2015  . Acid reflux 04/05/2015  . 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 Arthralgia of hip 04/05/2015  . H/O renal calculi 04/05/2015  . HLD (hyperlipidemia) 04/05/2015  . Hypoglycemia 04/05/2015  . Hemorrhoids, internal 04/05/2015  . Mood disorder with depressive  features due to general medical condition 04/05/2015  . Edema, peripheral 04/05/2015  . Apnea, sleep 04/05/2015  . Has a tremor 04/05/2015  . Herpes zona 04/05/2015   Past Medical History  Diagnosis Date  . Anxiety   . GERD (gastroesophageal reflux disease)   . Hyperlipidemia    Current Outpatient Prescriptions on File Prior to Visit  Medication Sig  . ALPRAZolam (XANAX) 0.5 MG tablet TAKE 1 TABLET BY MOUTH AT BEDTIME  . FLUoxetine (PROZAC) 20 MG capsule TAKE 3 CAPSULES BY MOUTH EVERY DAY  . gabapentin (NEURONTIN) 300 MG capsule TAKE ONE CAPSULE BY MOUTH 3 TIMES A DAY AND TAKE 2 CAPSULES BY MOUTH AT BEDTIME **INCREASE SLOWLY**  . HYDROcodone-acetaminophen (NORCO/VICODIN) 5-325 MG tablet Take 1 tablet by mouth every 6 (six) hours as needed for moderate pain.  Marland Kitchen morphine (KADIAN) 20 MG 24 hr capsule Take 1 capsule (20 mg total) by mouth 2 (two) times daily. To be filled after 10/08/15  . NEXIUM 40 MG capsule TAKE ONE CAPSULE BY MOUTH ONCE A DAY  . pravastatin (PRAVACHOL) 40 MG tablet TAKE 1 TABLET BY MOUTH AT BEDTIME   No current facility-administered medications on file prior to visit.   No Known Allergies Past Surgical History  Procedure Laterality Date  . Abdominal hysterectomy  1999    Fibroids  . Cholecystectomy  2004   Social History   Social History  . Marital Status: Single    Spouse Name: N/A  . Number of Children: N/A  . Years of Education: N/A   Occupational History  . Not on file.  Social History Main Topics  . Smoking status: Former Smoker -- 1.00 packs/day    Quit date: 11/08/2015  . Smokeless tobacco: Never Used     Comment: Recently Quit Smoking  . Alcohol Use: No  . Drug Use: No  . Sexual Activity: Not on file   Other Topics Concern  . Not on file   Social History Narrative   Family History  Problem Relation Age of Onset  . Lung cancer Mother   . Lung cancer Father   . Hypertension Sister   . COPD Sister       Objective:   Physical Exam   Constitutional: She appears well-developed and well-nourished.  Cardiovascular: Normal rate and regular rhythm.   Pulmonary/Chest: Effort normal and breath sounds normal.  Musculoskeletal:       Lumbar back: She exhibits tenderness.  Psychiatric: Her mood appears anxious. She expresses inappropriate judgment (pt reports she "does not think she could make it" without her sister; goes on to state she would not want to be here without her sister).  BP 100/66 mmHg  Pulse 76  Temp(Src) 98 F (36.7 C) (Oral)  Resp 16  Wt 221 lb (100.245 kg)     Assessment & Plan:  1. Chronic low back pain with right-sided sciatica, unspecified back pain laterality Worsening. Pt reports her Morphine seems to be less effective. Pt currently very distressed about twin sister in hospital. Will consider increasing dose at [facility] in 3 months, after sister's health improves.  2. Back pain, chronic 3 month refill given today as below.  - morphine (KADIAN) 20 MG 24 hr capsule; Take 1 capsule (20 mg total) by mouth 2 (two) times daily. To be filled after 01/29/2016.  Dispense: 60 capsule; Refill: 0 - HYDROcodone-acetaminophen (NORCO/VICODIN) 5-325 MG tablet; Take 1 tablet by mouth every 6 (six) hours as needed for moderate pain. To be filled after 01/29/2016.  Dispense: 60 tablet; Refill: 0   Patient seen and examined by Jerrell Belfast, MD, and note scribed by Renaldo Fiddler, CMA.  I have reviewed the document for accuracy and completeness and I agree with above. Jerrell Belfast, MD

## 2015-12-19 ENCOUNTER — Other Ambulatory Visit: Payer: Self-pay | Admitting: Family Medicine

## 2015-12-19 DIAGNOSIS — M549 Dorsalgia, unspecified: Principal | ICD-10-CM

## 2015-12-19 DIAGNOSIS — G8929 Other chronic pain: Secondary | ICD-10-CM

## 2016-01-10 ENCOUNTER — Telehealth: Payer: Self-pay

## 2016-01-10 NOTE — Telephone Encounter (Signed)
Pt called due to "rash all over body". Pt believes this is due to tape worms. Pt states dog had tape worms and vet checked pt's stool, which was positive for tape worms. Advised pt to bring lab results in for OV. Renaldo Fiddler, CMA

## 2016-01-11 ENCOUNTER — Ambulatory Visit (INDEPENDENT_AMBULATORY_CARE_PROVIDER_SITE_OTHER): Payer: Medicare Other | Admitting: Family Medicine

## 2016-01-11 ENCOUNTER — Encounter: Payer: Self-pay | Admitting: Family Medicine

## 2016-01-11 ENCOUNTER — Ambulatory Visit: Payer: Self-pay | Admitting: Family Medicine

## 2016-01-11 VITALS — BP 124/72 | HR 84 | Temp 98.6°F | Resp 16 | Wt 223.0 lb

## 2016-01-11 DIAGNOSIS — A071 Giardiasis [lambliasis]: Secondary | ICD-10-CM

## 2016-01-11 DIAGNOSIS — B711 Dipylidiasis: Secondary | ICD-10-CM | POA: Diagnosis not present

## 2016-01-11 DIAGNOSIS — R21 Rash and other nonspecific skin eruption: Secondary | ICD-10-CM

## 2016-01-11 DIAGNOSIS — B674 Echinococcus granulosus infection, unspecified: Secondary | ICD-10-CM

## 2016-01-11 HISTORY — DX: Echinococcus granulosus infection, unspecified: B67.4

## 2016-01-11 HISTORY — DX: Dipylidiasis: B71.1

## 2016-01-11 HISTORY — DX: Giardiasis (lambliasis): A07.1

## 2016-01-11 MED ORDER — PRAZIQUANTEL 600 MG PO TABS: 1200.0000 mg | ORAL_TABLET | Freq: Once | ORAL | Status: DC

## 2016-01-11 MED ORDER — METRONIDAZOLE 250 MG PO TABS: 500.0000 mg | ORAL_TABLET | Freq: Three times a day (TID) | ORAL | Status: DC

## 2016-01-11 NOTE — Progress Notes (Signed)
Patient ID: Alejandra Little, female   DOB: 1956-05-04, 60 y.o.   MRN: [id number]        Patient: Alejandra Little Female    DOB: 12-13-1955   60 y.o.   MRN: [id number] Visit Date: 01/11/2016  Today's Provider: Margarita Rana, MD   Chief Complaint  Patient presents with  . Rash   Subjective:    Rash This is a chronic problem. The current episode started more than 1 year ago. The problem is unchanged. Location: Bilateral upper and lower extremeties. The rash is characterized by redness. Associated with: Her dog has a tape worm. Pertinent negatives include no diarrhea or vomiting. Past treatments include nothing.   Her dog's vet check her stool and she had Giardia and Dipylidium tape worm also.  Thinks this may be the cause of her rash.     No Known Allergies Previous Medications   ALPRAZOLAM (XANAX) 0.5 MG TABLET    TAKE 1 TABLET BY MOUTH AT BEDTIME   FLUOXETINE (PROZAC) 20 MG CAPSULE    TAKE 3 CAPSULES BY MOUTH EVERY DAY   GABAPENTIN (NEURONTIN) 300 MG CAPSULE    TAKE ONE CAPSULE BY MOUTH 3 TIMES A DAY AND TAKE 2 CAPSULES BY MOUTH AT BEDTIME **INCREASE SLOWLY**   HYDROCODONE-ACETAMINOPHEN (NORCO/VICODIN) 5-325 MG TABLET    Take 1 tablet by mouth every 6 (six) hours as needed for moderate pain. To be filled after 01/29/2016.   MORPHINE (KADIAN) 20 MG 24 HR CAPSULE    Take 1 capsule (20 mg total) by mouth 2 (two) times daily. To be filled after 10/08/15   NEXIUM 40 MG CAPSULE    TAKE ONE CAPSULE BY MOUTH ONCE A DAY   PRAVASTATIN (PRAVACHOL) 40 MG TABLET    TAKE 1 TABLET BY MOUTH AT BEDTIME    Review of Systems  Constitutional: Negative.   Gastrointestinal: Positive for constipation (Chronic issue). Negative for nausea, vomiting, abdominal pain, diarrhea, blood in stool, abdominal distention, anal bleeding and rectal pain.  Skin: Positive for rash. Negative for color change, pallor and wound.    Social History  Substance Use Topics  . Smoking status: Former Smoker -- 1.00 packs/day   Quit date: 11/08/2015  . Smokeless tobacco: Never Used     Comment: Recently Quit Smoking  . Alcohol Use: No   Objective:   BP 124/72 mmHg  Pulse 84  Temp(Src) 98.6 F (37 C) (Oral)  Resp 16  Wt 223 lb (101.152 kg)  Physical Exam  Constitutional: She is oriented to person, place, and time. She appears well-developed and well-nourished.  Neurological: She is alert and oriented to person, place, and time.  Skin: Rash noted.  Variable size, diffuse, well circumscribed, irregularly shaped lesions with central clearing.   Psychiatric: She has a normal mood and affect. Her behavior is normal. Judgment and thought content normal.      Assessment & Plan:     1. Rash Not responding to treatment by dermatology. May be related to infections noted below.  2. Giardia New problem. Will treat and see if rash resolves. No abdominal symptoms.   - metroNIDAZOLE (FLAGYL) 250 MG tablet; Take 2 tablets (500 mg total) by mouth 3 (three) times daily.  Dispense: 21 tablet; Refill: 0  3. Dog tapeworm infection New problem. No abdominal symptoms, anemia, or weight loss. Will treat and see if rash improves.  - praziquantel (BILTRICIDE) 600 MG tablet; Take 2 tablets (1,200 mg total) by mouth once.  Dispense: 2 tablet; Refill: 0  Margarita Rana, MD  [facility]

## 2016-01-12 ENCOUNTER — Ambulatory Visit: Payer: Self-pay | Admitting: Family Medicine

## 2016-02-15 ENCOUNTER — Other Ambulatory Visit: Payer: Self-pay | Admitting: Family Medicine

## 2016-02-15 DIAGNOSIS — K219 Gastro-esophageal reflux disease without esophagitis: Secondary | ICD-10-CM

## 2016-02-15 DIAGNOSIS — E785 Hyperlipidemia, unspecified: Secondary | ICD-10-CM

## 2016-02-15 NOTE — Telephone Encounter (Signed)
LOV for chronic problems 09/07/2015. Alejandra Little, CMA

## 2016-02-28 ENCOUNTER — Ambulatory Visit (INDEPENDENT_AMBULATORY_CARE_PROVIDER_SITE_OTHER): Payer: Medicare Other | Admitting: Family Medicine

## 2016-02-28 ENCOUNTER — Encounter: Payer: Self-pay | Admitting: Family Medicine

## 2016-02-28 VITALS — BP 106/70 | HR 68 | Temp 97.5°F | Resp 16 | Ht 64.5 in | Wt 222.0 lb

## 2016-02-28 DIAGNOSIS — M549 Dorsalgia, unspecified: Secondary | ICD-10-CM | POA: Diagnosis not present

## 2016-02-28 DIAGNOSIS — G8929 Other chronic pain: Secondary | ICD-10-CM

## 2016-02-28 DIAGNOSIS — E785 Hyperlipidemia, unspecified: Secondary | ICD-10-CM | POA: Diagnosis not present

## 2016-02-28 DIAGNOSIS — E162 Hypoglycemia, unspecified: Secondary | ICD-10-CM | POA: Diagnosis not present

## 2016-02-28 DIAGNOSIS — Z126 Encounter for screening for malignant neoplasm of bladder: Secondary | ICD-10-CM

## 2016-02-28 DIAGNOSIS — Z0000 Encounter for general adult medical examination without abnormal findings: Secondary | ICD-10-CM | POA: Diagnosis not present

## 2016-02-28 LAB — POCT URINALYSIS DIPSTICK
Bilirubin, UA: NEGATIVE
Blood, UA: NEGATIVE
Glucose, UA: NEGATIVE
Ketones, UA: NEGATIVE
Leukocytes, UA: NEGATIVE
Nitrite, UA: NEGATIVE
Spec Grav, UA: 1.02
Urobilinogen, UA: 0.2
pH, UA: 6.5

## 2016-02-28 MED ORDER — MORPHINE SULFATE ER 20 MG PO CP24: 20.0000 mg | ORAL_CAPSULE | Freq: Two times a day (BID) | ORAL | Status: DC

## 2016-02-28 MED ORDER — HYDROCODONE-ACETAMINOPHEN 5-325 MG PO TABS: 1.0000 | ORAL_TABLET | Freq: Four times a day (QID) | ORAL | Status: DC | PRN

## 2016-02-28 NOTE — Progress Notes (Signed)
Patient: Alejandra Little, Female    DOB: Aug 06, 1956, 60 y.o.   MRN: [id number] Visit Date: 02/28/2016  Today's Provider: Margarita Rana, MD   Chief Complaint  Patient presents with  . Medicare Wellness   Subjective:    Annual wellness visit Alejandra Little is a 60 y.o. female. She feels fairly well. She reports exercising every other day; walks for about 30 minutes. She reports she is sleeping well.  Last Mammo- 11/14/2014- BI-RADS 1 Last Colon- 12/14/2012- Dr. Vira Agar. Internal hemorrhoids. Recheck 5 years. S/P hysterectomy (1999) due to fibroids.  -----------------------------------------------------------   Review of Systems  Respiratory: Positive for apnea.   Gastrointestinal: Positive for constipation.  Endocrine: Positive for heat intolerance.  Musculoskeletal: Positive for back pain and neck pain.  Skin: Positive for rash.  Neurological: Positive for tremors.  Psychiatric/Behavioral: The patient is nervous/anxious.   All other systems reviewed and are negative.   Social History   Social History  . Marital Status: Single    Spouse Name: N/A  . Number of Children: 3  . Years of Education: 8   Occupational History  . Disability    Social History Main Topics  . Smoking status: Former Smoker -- 1.00 packs/day    Quit date: 11/08/2015  . Smokeless tobacco: Never Used     Comment: Recently Quit Smoking  . Alcohol Use: No  . Drug Use: No  . Sexual Activity: Yes   Other Topics Concern  . Not on file   Social History Narrative    Past Medical History  Diagnosis Date  . Anxiety   . GERD (gastroesophageal reflux disease)   . Hyperlipidemia      Patient Active Problem List   Diagnosis Date Noted  . Giardia 01/11/2016  . Dog tapeworm infection 01/11/2016  . Chronic low back pain with right-sided sciatica 12/01/2015  . Anxiety 04/05/2015  . Adult BMI 30+ 04/05/2015  . Back pain, chronic 04/05/2015  . Chronic constipation 04/05/2015  . Colon  polyp 04/05/2015  . Current tobacco use 04/05/2015  . Abnormal liver enzymes 04/05/2015  . Acid reflux 04/05/2015  . 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 Arthralgia of hip 04/05/2015  . H/O renal calculi 04/05/2015  . HLD (hyperlipidemia) 04/05/2015  . Hypoglycemia 04/05/2015  . Hemorrhoids, internal 04/05/2015  . 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 Edema, peripheral 04/05/2015  . Apnea, sleep 04/05/2015  . Has a tremor 04/05/2015  . Herpes zona 04/05/2015    Past Surgical History  Procedure Laterality Date  . Abdominal hysterectomy  1999    Fibroids  . Cholecystectomy  2004  . Lithotripsy      Her family history includes COPD in her sister and sister; Hypertension in her sister; Lung cancer in her father and mother.    Previous Medications   ALPRAZOLAM (XANAX) 0.5 MG TABLET    TAKE 1 TABLET BY MOUTH AT BEDTIME   FLUOXETINE (PROZAC) 20 MG CAPSULE    TAKE 3 CAPSULES BY MOUTH EVERY DAY   GABAPENTIN (NEURONTIN) 300 MG CAPSULE    TAKE ONE CAPSULE BY MOUTH 3 TIMES A DAY AND TAKE 2 CAPSULES BY MOUTH AT BEDTIME **INCREASE SLOWLY**   HYDROCODONE-ACETAMINOPHEN (NORCO/VICODIN) 5-325 MG TABLET    Take 1 tablet by mouth every 6 (six) hours as needed for moderate pain. To be filled after 01/29/2016.   MORPHINE (KADIAN) 20 MG 24 HR CAPSULE    Take 1 capsule (20 mg total)  by mouth 2 (two) times daily. To be filled after 10/08/15   NEXIUM 40 MG CAPSULE    TAKE ONE CAPSULE BY MOUTH ONCE A DAY   PRAVASTATIN (PRAVACHOL) 40 MG TABLET    TAKE 1 TABLET BY MOUTH AT BEDTIME    Patient Care Team: Margarita Rana, MD as PCP - General (Family Medicine)     Objective:   Vitals: BP 106/70 mmHg  Pulse 68  Temp(Src) 97.5 F (36.4 C) (Oral)  Resp 16  Ht 5' 4.5" (1.638 m)  Wt 222 lb (100.699 kg)  BMI 37.53 kg/m2  Physical Exam  Constitutional: She is oriented to person, place, and time. She appears well-developed and well-nourished.  HENT:  Head:  Normocephalic and atraumatic.  Right Ear: Tympanic membrane, external ear and ear canal normal.  Left Ear: Tympanic membrane, external ear and ear canal normal.  Nose: Nose normal.  Mouth/Throat: Uvula is midline, oropharynx is clear and moist and mucous membranes are normal.  Eyes: Conjunctivae, EOM and lids are normal. Pupils are equal, round, and reactive to light.  Neck: Trachea normal and normal range of motion. Neck supple. Carotid bruit is not present. No thyroid mass and no thyromegaly present.  Cardiovascular: Normal rate, regular rhythm and normal heart sounds.   Pulmonary/Chest: Effort normal and breath sounds normal.  Abdominal: Soft. Normal appearance and bowel sounds are normal. There is no hepatosplenomegaly. There is no tenderness.  Genitourinary: No breast swelling, tenderness or discharge.  Well hearing breast scars  Musculoskeletal: Normal range of motion.  Lymphadenopathy:    She has no cervical adenopathy.    She has no axillary adenopathy.  Neurological: She is alert and oriented to person, place, and time. She has normal strength. No cranial nerve deficit.  Skin: Skin is warm, dry and intact. Rash (granuloma annulare) noted.  Psychiatric: She has a normal mood and affect. Her speech is normal and behavior is normal. Judgment and thought content normal. Cognition and memory are normal.    Activities of Daily Living In your present state of health, do you have any difficulty performing the following activities: 02/28/2016 06/08/2015  Hearing? N N  Vision? N N  Difficulty concentrating or making decisions? N N  Walking or climbing stairs? Y N  Dressing or bathing? N N  Doing errands, shopping? N N    Fall Risk Assessment Fall Risk  02/28/2016 06/08/2015 06/07/2015  Falls in the past year? Yes No No  Number falls in past yr: 1 - -  Injury with Fall? No - -  Risk for fall due to : Medication side effect - -  Follow up Falls evaluation completed - -     Depression  Screen PHQ 2/9 Scores 02/28/2016 06/08/2015 06/07/2015  PHQ - 2 Score 4 0 4  PHQ- 9 Score 8 - -    Cognitive Testing - 6-CIT  Correct? Score   What year is it? yes 0 0 or 4  What month is it? yes 0 0 or 3  Memorize:    Alejandra Little,  [birth year],  Alejandra Little,      What time is it? (within 1 hour) yes 0 0 or 3  Count backwards from 20 yes 0 0, 2, or 4  Name the months of the year yes 0 0, 2, or 4  Repeat name & address above no 4 0, 2, 4, 6, 8, or 10       TOTAL SCORE  4/28   Interpretation:  Normal  Normal (0-7) Abnormal (8-28)       Assessment & Plan:     Annual Wellness Visit  Reviewed patient's Family Medical History Reviewed and updated list of patient's medical providers Assessment of cognitive impairment was done Assessed patient's functional ability Established a written schedule for health screening [place] Completed and Reviewed  Exercise Activities and Dietary recommendations Goals    None      Immunization History  Administered Date(s) Administered  . Influenza,inj,Quad PF,36+ Mos 09/07/2015  . Tdap 09/07/2015    Health Maintenance  Topic Date Due  . Hepatitis C Screening  [birth year]  . HIV Screening  10/21/1969  . PAP SMEAR  10/22/1975  . ZOSTAVAX  10/21/2014  . INFLUENZA VACCINE  06/18/2016  . MAMMOGRAM  11/14/2016  . COLONOSCOPY  12/14/2022  . TETANUS/TDAP  09/06/2025      Discussed health benefits of physical activity, and encouraged her to engage in regular exercise appropriate for her age and condition.    ------------------------------------------------------------------------------------------------------------ 1. Medicare annual wellness visit, subsequent Stable. As above.  2. Bladder cancer screening UA negative. - POCT urinalysis dipstick  Results for orders placed or performed in visit on 02/28/16  POCT urinalysis dipstick  Result Value Ref Range   Color, UA yellow    Clarity, UA clear    Glucose, UA  negative    Bilirubin, UA negative    Ketones, UA negative    Spec Grav, UA 1.020    Blood, UA negative    pH, UA 6.5    Protein, UA trace    Urobilinogen, UA 0.2    Nitrite, UA negative    Leukocytes, UA Negative Negative     3. HLD (hyperlipidemia) Pt not currently taking statin due to side effects. Is drinking vinegar. Check labs as below. FU pending results. - Comprehensive metabolic panel - CBC with Differential/Platelet - Lipid panel - TSH  4. Hypoglycemia Pt has H/O this. FU pending results. - Hemoglobin A1c  5. Back pain, chronic Stable. Refill medications x 3 months. - HYDROcodone-acetaminophen (NORCO/VICODIN) 5-325 MG tablet; Take 1 tablet by mouth every 6 (six) hours as needed for moderate pain. To be filled after 04/30/2016.  Dispense: 60 tablet; Refill: 0 - morphine (KADIAN) 20 MG 24 hr capsule; Take 1 capsule (20 mg total) by mouth 2 (two) times daily. To be filled after 05/01/2015  Dispense: 60 capsule; Refill: 0    Patient seen and examined by Jerrell Belfast, MD, and note scribed by Renaldo Fiddler, CMA.  I have reviewed the document for accuracy and completeness and I agree with above. Jerrell Belfast, MD   Margarita Rana, MD

## 2016-02-29 LAB — CBC WITH DIFFERENTIAL/PLATELET
Basophils Absolute: 0 10*3/uL (ref 0.0–0.2)
Basos: 1 %
EOS (ABSOLUTE): 0.3 10*3/uL (ref 0.0–0.4)
Eos: 6 %
Hematocrit: 43.3 % (ref 34.0–46.6)
Hemoglobin: 14.5 g/dL (ref 11.1–15.9)
Immature Grans (Abs): 0 10*3/uL (ref 0.0–0.1)
Immature Granulocytes: 0 %
Lymphocytes Absolute: 2.2 10*3/uL (ref 0.7–3.1)
Lymphs: 36 %
MCH: 30.2 pg (ref 26.6–33.0)
MCHC: 33.5 g/dL (ref 31.5–35.7)
MCV: 90 fL (ref 79–97)
Monocytes Absolute: 0.4 10*3/uL (ref 0.1–0.9)
Monocytes: 6 %
Neutrophils Absolute: 3.2 10*3/uL (ref 1.4–7.0)
Neutrophils: 51 %
Platelets: 172 10*3/uL (ref 150–379)
RBC: 4.8 x10E6/uL (ref 3.77–5.28)
RDW: 13.8 % (ref 12.3–15.4)
WBC: 6.1 10*3/uL (ref 3.4–10.8)

## 2016-02-29 LAB — LIPID PANEL
Chol/HDL Ratio: 6 ratio units — ABNORMAL HIGH (ref 0.0–4.4)
Cholesterol, Total: 259 mg/dL — ABNORMAL HIGH (ref 100–199)
HDL: 43 mg/dL (ref >39)
LDL Calculated: 176 mg/dL — ABNORMAL HIGH (ref 0–99)
Triglycerides: 198 mg/dL — ABNORMAL HIGH (ref 0–149)
VLDL Cholesterol Cal: 40 mg/dL (ref 5–40)

## 2016-02-29 LAB — COMPREHENSIVE METABOLIC PANEL
ALT: 15 IU/L (ref 0–32)
AST: 19 IU/L (ref 0–40)
Albumin/Globulin Ratio: 1.7 (ref 1.2–2.2)
Albumin: 4.2 g/dL (ref 3.6–4.8)
Alkaline Phosphatase: 69 IU/L (ref 39–117)
BUN/Creatinine Ratio: 18 (ref 12–28)
BUN: 14 mg/dL (ref 8–27)
Bilirubin Total: 0.3 mg/dL (ref 0.0–1.2)
CO2: 28 mmol/L (ref 18–29)
Calcium: 9.8 mg/dL (ref 8.7–10.3)
Chloride: 103 mmol/L (ref 96–106)
Creatinine, Ser: 0.79 mg/dL (ref 0.57–1.00)
GFR calc Af Amer: 93 mL/min/{1.73_m2} (ref >59)
GFR calc non Af Amer: 81 mL/min/{1.73_m2} (ref >59)
Globulin, Total: 2.5 g/dL (ref 1.5–4.5)
Glucose: 95 mg/dL (ref 65–99)
Potassium: 6 mmol/L — ABNORMAL HIGH (ref 3.5–5.2)
Sodium: 144 mmol/L (ref 134–144)
Total Protein: 6.7 g/dL (ref 6.0–8.5)

## 2016-02-29 LAB — HEMOGLOBIN A1C
Est. average glucose Bld gHb Est-mCnc: 128 mg/dL
Hgb A1c MFr Bld: 6.1 % — ABNORMAL HIGH (ref 4.8–5.6)

## 2016-02-29 LAB — TSH: TSH: 2 u[IU]/mL (ref 0.450–4.500)

## 2016-03-01 ENCOUNTER — Telehealth: Payer: Self-pay

## 2016-03-01 DIAGNOSIS — E875 Hyperkalemia: Secondary | ICD-10-CM

## 2016-03-01 NOTE — Telephone Encounter (Signed)
Advised pt of lab results. Pt verbally acknowledges understanding. Pt will start statin taking medication again. Renaldo Fiddler, CMA

## 2016-03-01 NOTE — Telephone Encounter (Signed)
LMTCB Alejandra Little, CMA  

## 2016-03-01 NOTE — Telephone Encounter (Signed)
-----   Message from Margarita Rana, MD sent at 02/29/2016  7:26 AM EDT ----- Labs fairly stable. Blood sugar pre-diabetic. Make sure to eat healthy and try to increase activity as tolerated.   Cholesterol elevated and 10 year risk of heart disease is 7.3%,  Right at cut off for starting a statin.  Please see if she would like to try a medication or continue lifestyle changes, especially not smoking, which I know she will keep up.  Also, potassium is elevated, and I think this is a lab error. Please stay well hydrated and repeat this lab this week or early next. Thanks.

## 2016-03-20 ENCOUNTER — Telehealth: Payer: Self-pay | Admitting: Family Medicine

## 2016-03-20 MED ORDER — DOXYCYCLINE HYCLATE 100 MG PO TABS: 100.0000 mg | ORAL_TABLET | Freq: Two times a day (BID) | ORAL | Status: DC

## 2016-03-20 NOTE — Telephone Encounter (Signed)
Pt thinks its time for an antibiotic.  She says she is coughing, wheezing and coughing up a lot of thick mucus.  She has been using mucinex without much relief.  She does not have any sinus symptoms and is not running fevers.  Please sent the antibiotic to [facility].   Thanks,   -Mickel Baas

## 2016-03-20 NOTE — Telephone Encounter (Signed)
Please see if would like an antibiotic.  Thanks.

## 2016-03-20 NOTE — Telephone Encounter (Signed)
Pt was just in 4/12 for a physical.  She has a cough she cant get rid of and wants to know if you can prescribe some for her.  She does not want to come in unless she absolutely has to,  [facility] [place]  Her call back is   [phone]  [employer]

## 2016-03-26 ENCOUNTER — Telehealth: Payer: Self-pay | Admitting: Family Medicine

## 2016-03-26 MED ORDER — BENZONATATE 100 MG PO CAPS: 100.0000 mg | ORAL_CAPSULE | Freq: Three times a day (TID) | ORAL | Status: DC | PRN

## 2016-03-26 NOTE — Telephone Encounter (Signed)
Can take OTC medication or I can call in tessalon perls.  Can not write for other cough rxs as all contain narcotics and she is already on these medication. Thanks.

## 2016-03-26 NOTE — Telephone Encounter (Signed)
Pt still has a cough.  She is still taking the antibiotic.  She would lik eto know if you would prescribe a cough RX.  [facility] [place] [place]

## 2016-03-26 NOTE — Telephone Encounter (Signed)
Pt advised.  She would like to try tessalon perls.    Thanks,   -Mickel Baas

## 2016-04-09 ENCOUNTER — Telehealth: Payer: Self-pay | Admitting: Family Medicine

## 2016-04-09 NOTE — Telephone Encounter (Signed)
Pt was seen on 02/28/16 for a physical.  The patient has regular medicare (red, white and blue) and they stated she was not sure for her physical until July of this year.  Pt is wanting to know if we are able to code the visit another way, maybe as an office visit.

## 2016-04-09 NOTE — Telephone Encounter (Signed)
Says Medicare Wellness as reason for visit.  Don't see how we can change that. Thanks.

## 2016-05-02 NOTE — Telephone Encounter (Signed)
Contacted Monica and was approved to adjust the balance since it was office error for have CPE too soon.  05/02/16 Wynn Maudlin with [facility] [employer] adjusted the charge.

## 2016-05-08 ENCOUNTER — Other Ambulatory Visit: Payer: Self-pay | Admitting: Family Medicine

## 2016-05-08 DIAGNOSIS — F419 Anxiety disorder, unspecified: Secondary | ICD-10-CM

## 2016-05-09 NOTE — Telephone Encounter (Signed)
Printed, please fax or call in to pharmacy. Thank you.   

## 2016-05-13 ENCOUNTER — Encounter: Payer: Self-pay | Admitting: Family Medicine

## 2016-05-13 ENCOUNTER — Ambulatory Visit (INDEPENDENT_AMBULATORY_CARE_PROVIDER_SITE_OTHER): Payer: Medicare Other | Admitting: Family Medicine

## 2016-05-13 VITALS — BP 100/56 | HR 80 | Temp 97.9°F | Resp 16 | Ht 64.5 in | Wt 220.0 lb

## 2016-05-13 DIAGNOSIS — M542 Cervicalgia: Secondary | ICD-10-CM | POA: Diagnosis not present

## 2016-05-13 DIAGNOSIS — Z1231 Encounter for screening mammogram for malignant neoplasm of breast: Secondary | ICD-10-CM | POA: Diagnosis not present

## 2016-05-13 DIAGNOSIS — Z8249 Family history of ischemic heart disease and other diseases of the circulatory system: Secondary | ICD-10-CM | POA: Diagnosis not present

## 2016-05-13 MED ORDER — MELOXICAM 7.5 MG PO TABS: 7.5000 mg | ORAL_TABLET | Freq: Two times a day (BID) | ORAL | Status: DC

## 2016-05-13 NOTE — Patient Instructions (Signed)
Please call the [facility] at [facility] to schedule this at [phone]   

## 2016-05-13 NOTE — Progress Notes (Signed)
Patient: Alejandra Little Female    DOB: 1956/11/08   60 y.o.   MRN: [id number] Visit Date: 05/13/2016  Today's Provider: Margarita Rana, MD   Chief Complaint  Patient presents with  . Neck Pain   Subjective:    Neck Pain  This is a new problem. The current episode started in the past 7 days. The problem occurs constantly. The problem has been unchanged. The pain is associated with nothing. The pain is present in the right side. The quality of the pain is described as aching ("a pull"). The pain is at a severity of 4/10. The symptoms are aggravated by twisting. The pain is same all the time. Associated symptoms include headaches. Pertinent negatives include no numbness, pain with swallowing, tingling, trouble swallowing or weakness. Associated symptoms comments: Right ear pain. She has tried nothing for the symptoms.  Patient concerned about carotid blockage, reports that her sister has blockage.      No Known Allergies Current Meds  Medication Sig  . ALPRAZolam (XANAX) 0.5 MG tablet TAKE 1 TABLET BY MOUTH AT BEDTIME  . FLUoxetine (PROZAC) 20 MG capsule TAKE 3 CAPSULES BY MOUTH EVERY DAY  . gabapentin (NEURONTIN) 300 MG capsule TAKE ONE CAPSULE BY MOUTH 3 TIMES A DAY AND TAKE 2 CAPSULES BY MOUTH AT BEDTIME **INCREASE SLOWLY**  . HYDROcodone-acetaminophen (NORCO/VICODIN) 5-325 MG tablet Take 1 tablet by mouth every 6 (six) hours as needed for moderate pain. To be filled after 04/30/2016.  Marland Kitchen morphine (KADIAN) 20 MG 24 hr capsule Take 1 capsule (20 mg total) by mouth 2 (two) times daily. To be filled after 05/01/2015  . NEXIUM 40 MG capsule TAKE ONE CAPSULE BY MOUTH ONCE A DAY  . pravastatin (PRAVACHOL) 40 MG tablet TAKE 1 TABLET BY MOUTH AT BEDTIME    Review of Systems  HENT: Negative for trouble swallowing.   Musculoskeletal: Positive for neck pain.  Neurological: Positive for headaches. Negative for tingling, weakness and numbness.    Social History  Substance Use Topics  .  Smoking status: Former Smoker -- 1.00 packs/day    Quit date: 11/08/2015  . Smokeless tobacco: Never Used     Comment: Recently Quit Smoking  . Alcohol Use: No   Objective:   BP 100/56 mmHg  Pulse 80  Temp(Src) 97.9 F (36.6 C) (Oral)  Resp 16  Ht 5' 4.5" (1.638 m)  Wt 220 lb (99.791 kg)  BMI 37.19 kg/m2  Physical Exam  Constitutional: She is oriented to person, place, and time. She appears well-developed and well-nourished.  Neck: Normal range of motion. Neck supple.  Mildly tender right anterior neck.    Cardiovascular: Normal rate, regular rhythm and normal heart sounds.   Pulmonary/Chest: Effort normal and breath sounds normal.  Neurological: She is alert and oriented to person, place, and time. She has normal strength and normal reflexes.  Psychiatric: She has a normal mood and affect. Her behavior is normal. Judgment and thought content normal.        Assessment & Plan:     1. Neck pain New problem. Patient started on meloxicam 7.5 mg as below. F/U pending [place] report.   - meloxicam (MOBIC) 7.5 MG tablet; Take 1 tablet (7.5 mg total) by mouth 2 (two) times daily.  Dispense: 60 tablet; Refill: 1 - US Carotid Duplex Right; Future  2. Family history of carotid artery stenosis - US Carotid Duplex Right; Future  3. Encounter for screening mammogram for breast cancer - MM  DIGITAL SCREENING BILATERAL; Future     Patient seen and examined by Dr. Jerrell Belfast, and note scribed by Philbert Riser. Dimas, CMA.  I have reviewed the document for accuracy and completeness and I agree with above. - Jerrell Belfast, MD   Margarita Rana, MD  [facility]

## 2016-05-15 ENCOUNTER — Telehealth: Payer: Self-pay | Admitting: Family Medicine

## 2016-05-15 ENCOUNTER — Other Ambulatory Visit: Payer: Self-pay | Admitting: Family Medicine

## 2016-05-15 DIAGNOSIS — Z8249 Family history of ischemic heart disease and other diseases of the circulatory system: Secondary | ICD-10-CM

## 2016-05-15 DIAGNOSIS — M542 Cervicalgia: Secondary | ICD-10-CM

## 2016-05-15 NOTE — Telephone Encounter (Signed)
Scheduling department at [facility] states the correct order for carotid doppler should be [id number]

## 2016-05-17 ENCOUNTER — Ambulatory Visit: Payer: Medicare Other

## 2016-05-29 ENCOUNTER — Encounter: Payer: Self-pay | Admitting: Family Medicine

## 2016-05-29 ENCOUNTER — Ambulatory Visit (INDEPENDENT_AMBULATORY_CARE_PROVIDER_SITE_OTHER): Payer: Medicare Other | Admitting: Family Medicine

## 2016-05-29 VITALS — BP 100/60 | HR 72 | Temp 97.9°F | Resp 16 | Ht 65.0 in | Wt 218.0 lb

## 2016-05-29 DIAGNOSIS — E785 Hyperlipidemia, unspecified: Secondary | ICD-10-CM

## 2016-05-29 DIAGNOSIS — M549 Dorsalgia, unspecified: Secondary | ICD-10-CM | POA: Diagnosis not present

## 2016-05-29 DIAGNOSIS — G8929 Other chronic pain: Secondary | ICD-10-CM

## 2016-05-29 DIAGNOSIS — M542 Cervicalgia: Secondary | ICD-10-CM | POA: Diagnosis not present

## 2016-05-29 DIAGNOSIS — Z87891 Personal history of nicotine dependence: Secondary | ICD-10-CM

## 2016-05-29 MED ORDER — MORPHINE SULFATE ER 20 MG PO CP24: 20.0000 mg | ORAL_CAPSULE | Freq: Two times a day (BID) | ORAL | Status: DC

## 2016-05-29 MED ORDER — HYDROCODONE-ACETAMINOPHEN 5-325 MG PO TABS: 1.0000 | ORAL_TABLET | Freq: Four times a day (QID) | ORAL | Status: DC | PRN

## 2016-05-29 NOTE — Progress Notes (Signed)
Patient: Alejandra Little Female    DOB: 21-Jul-1956   60 y.o.   MRN: [id number] Visit Date: 05/29/2016  Today's Provider: Lelon Huh, MD   Chief Complaint  Patient presents with  . Hyperlipidemia    follow-up  . Back Pain    chronic   Subjective:    HPI  Lipid/Cholesterol, Follow-up:   Last seen for this 3 months ago.  Management changes since that visit include restarting stain. . Last Lipid Panel:    Component Value Date/Time   CHOL 259* 02/28/2016 1115   CHOL 184 06/23/2014   TRIG 198* 02/28/2016 1115   HDL 43 02/28/2016 1115   HDL 36 06/23/2014   CHOLHDL 6.0* 02/28/2016 1115   LDLCALC 176* 02/28/2016 1115   LDLCALC 100 06/23/2014      She reports excellent compliance with treatment. She is not having side effects.   Weight trend: stable Prior visit with dietician: no Current diet: in general, an "unhealthy" diet Current exercise: walking  Wt Readings from Last 3 Encounters:  05/13/16 220 lb (99.791 kg)  02/28/16 222 lb (100.699 kg)  01/11/16 223 lb (101.152 kg)   -------------------------------------------------------------------   Follow up for chronic low back pain with right-sided sciatica  The patient was last seen for this 3 months ago. Changes made at last visit include continue Hydrocodone.  She reports excellent compliance with treatment. She feels that condition is Unchanged. She is not having side effects.   ------------------------------------------------------------------------------------      No Known Allergies Current Meds  Medication Sig  . ALPRAZolam (XANAX) 0.5 MG tablet TAKE 1 TABLET BY MOUTH AT BEDTIME  . FLUoxetine (PROZAC) 20 MG capsule TAKE 3 CAPSULES BY MOUTH EVERY DAY  . gabapentin (NEURONTIN) 300 MG capsule TAKE ONE CAPSULE BY MOUTH 3 TIMES A DAY AND TAKE 2 CAPSULES BY MOUTH AT BEDTIME **INCREASE SLOWLY**  . HYDROcodone-acetaminophen (NORCO/VICODIN) 5-325 MG tablet Take 1 tablet by mouth every 6 (six)  hours as needed for moderate pain. To be filled after 04/30/2016.  Marland Kitchen morphine (KADIAN) 20 MG 24 hr capsule Take 1 capsule (20 mg total) by mouth 2 (two) times daily. To be filled after 05/01/2015  . NEXIUM 40 MG capsule TAKE ONE CAPSULE BY MOUTH ONCE A DAY  . pravastatin (PRAVACHOL) 40 MG tablet TAKE 1 TABLET BY MOUTH AT BEDTIME    Review of Systems  Constitutional: Positive for appetite change.  Musculoskeletal: Positive for back pain.  Psychiatric/Behavioral: Positive for sleep disturbance. The patient is nervous/anxious.     Social History  Substance Use Topics  . Smoking status: Former Smoker -- 1.00 packs/day    Quit date: 11/08/2015  . Smokeless tobacco: Never Used     Comment: Recently Quit Smoking  . Alcohol Use: No   Objective:   BP 100/60 mmHg  Pulse 72  Temp(Src) 97.9 F (36.6 C) (Oral)  Resp 16  Ht 5\' 5"  (1.651 m)  Wt 218 lb (98.884 kg)  BMI 36.28 kg/m2  Physical Exam   General Appearance:    Alert, cooperative, no distress  Eyes:    PERRL, conjunctiva/corneas clear, EOM's intact       Lungs:     Clear to auscultation bilaterally, respirations unlabored  Heart:    Regular rate and rhythm  Neurologic:   Awake, alert, oriented x 3. No apparent focal neurological           defect.           Assessment & Plan:  1. History of tobacco use Congratulated on smoking cessation and encouraged not to start again  2. Neck pain Much better since last visit. She decided not to do carotid ultrasound since her pain has resolved and   3. HLD (hyperlipidemia) Is back on pravastatin which she states she is tolerating well.   4. Back pain, chronic Stable on current medications. Continue unchanged.   - HYDROcodone-acetaminophen (NORCO/VICODIN) 5-325 MG tablet; Take 1 tablet by mouth every 6 (six) hours as needed for moderate pain. To be filled after 07/28/2016.  Dispense: 60 tablet; Refill: 0 - morphine (KADIAN) 20 MG 24 hr capsule; Take 1 capsule (20 mg total) by  mouth 2 (two) times daily. To be filled after 07/28/2016  Dispense: 60 capsule; Refill: 0 - Pain Mgt Scrn (14 Drugs), Ur       Lelon Huh, MD  [facility]

## 2016-06-01 LAB — PAIN MGT SCRN (14 DRUGS), UR
Amphetamine Screen, Ur: NEGATIVE ng/mL
Barbiturate Screen, Ur: NEGATIVE ng/mL
Benzodiazepine Screen, Urine: POSITIVE ng/mL
Buprenorphine, Urine: NEGATIVE ng/mL
Cannabinoids Ur Ql Scn: NEGATIVE ng/mL
Cocaine(Metab.)Screen, Urine: NEGATIVE ng/mL
Creatinine(Crt), U: 233.7 mg/dL (ref 20.0–300.0)
Fentanyl, Urine: NEGATIVE pg/mL
Meperidine Screen, Urine: NEGATIVE ng/mL
Methadone Scn, Ur: NEGATIVE ng/mL
Opiate Scrn, Ur: POSITIVE ng/mL
Oxycodone+Oxymorphone Ur Ql Scn: NEGATIVE ng/mL
PCP Scrn, Ur: NEGATIVE ng/mL
Ph of Urine: 6.9 (ref 4.5–8.9)
Propoxyphene, Screen: NEGATIVE ng/mL
Tramadol Ur Ql Scn: NEGATIVE ng/mL

## 2016-06-11 ENCOUNTER — Other Ambulatory Visit: Payer: Self-pay | Admitting: *Deleted

## 2016-06-11 DIAGNOSIS — M549 Dorsalgia, unspecified: Principal | ICD-10-CM

## 2016-06-11 DIAGNOSIS — G8929 Other chronic pain: Secondary | ICD-10-CM

## 2016-06-11 MED ORDER — GABAPENTIN 300 MG PO CAPS: ORAL_CAPSULE | ORAL | 5 refills | Status: DC

## 2016-06-21 ENCOUNTER — Other Ambulatory Visit: Payer: Self-pay | Admitting: Family Medicine

## 2016-06-21 DIAGNOSIS — F419 Anxiety disorder, unspecified: Secondary | ICD-10-CM

## 2016-07-09 ENCOUNTER — Other Ambulatory Visit: Payer: Self-pay | Admitting: Family Medicine

## 2016-07-09 DIAGNOSIS — F419 Anxiety disorder, unspecified: Secondary | ICD-10-CM

## 2016-07-09 NOTE — Telephone Encounter (Signed)
Rx called in to pharmacy. 

## 2016-07-09 NOTE — Telephone Encounter (Signed)
Please call in alprazolam.  

## 2016-07-25 DIAGNOSIS — Z23 Encounter for immunization: Secondary | ICD-10-CM | POA: Diagnosis not present

## 2016-08-04 ENCOUNTER — Other Ambulatory Visit: Payer: Self-pay | Admitting: Family Medicine

## 2016-08-04 DIAGNOSIS — E785 Hyperlipidemia, unspecified: Secondary | ICD-10-CM

## 2016-08-04 DIAGNOSIS — K219 Gastro-esophageal reflux disease without esophagitis: Secondary | ICD-10-CM

## 2016-08-22 ENCOUNTER — Ambulatory Visit (INDEPENDENT_AMBULATORY_CARE_PROVIDER_SITE_OTHER): Payer: Medicare Other | Admitting: Family Medicine

## 2016-08-22 ENCOUNTER — Encounter: Payer: Self-pay | Admitting: Family Medicine

## 2016-08-22 VITALS — BP 122/68 | HR 68 | Temp 98.0°F | Resp 16 | Wt 211.0 lb

## 2016-08-22 DIAGNOSIS — E7800 Pure hypercholesterolemia, unspecified: Secondary | ICD-10-CM

## 2016-08-22 DIAGNOSIS — F419 Anxiety disorder, unspecified: Secondary | ICD-10-CM | POA: Diagnosis not present

## 2016-08-22 DIAGNOSIS — M549 Dorsalgia, unspecified: Secondary | ICD-10-CM | POA: Diagnosis not present

## 2016-08-22 DIAGNOSIS — G8929 Other chronic pain: Secondary | ICD-10-CM | POA: Diagnosis not present

## 2016-08-22 MED ORDER — HYDROCODONE-ACETAMINOPHEN 5-325 MG PO TABS: 1.0000 | ORAL_TABLET | Freq: Four times a day (QID) | ORAL | 0 refills | Status: DC | PRN

## 2016-08-22 MED ORDER — MORPHINE SULFATE ER 20 MG PO CP24: 20.0000 mg | ORAL_CAPSULE | Freq: Two times a day (BID) | ORAL | 0 refills | Status: DC

## 2016-08-22 NOTE — Progress Notes (Signed)
Patient: Alejandra Little Female    DOB: 09/08/1956   60 y.o.   MRN: [id number] Visit Date: 08/22/2016  Today's Provider: Lelon Huh, MD   Chief Complaint  Patient presents with  . chronic back pain  . Anxiety   Subjective:    HPI Patient presents today for a follow up on chronic back pain. Patient reports that she takes Norco 5/325mg  for her pain which helps. Patient also reports that she needs a refill on her pain medication. States she usually takes once a day, but sometimes takes a second if she has a neck spasm. Is taking Morphine twice a day every day.    Follow up hyperlipidemia Lab Results  Component Value Date   CHOL 259 (H) 02/28/2016   HDL 43 02/28/2016   LDLCALC 176 (H) 02/28/2016   TRIG 198 (H) 02/28/2016   CHOLHDL 6.0 (H) 02/28/2016   Since lipids last checked in April she has started back on pravastatin and states she is tolerating well with no adverse effects.   No Known Allergies   Current Outpatient Prescriptions:  .  ALPRAZolam (XANAX) 0.5 MG tablet, TAKE 1 TABLET BY MOUTH AT BEDTIME, Disp: 30 tablet, Rfl: 5 .  FLUoxetine (PROZAC) 20 MG capsule, TAKE 3 CAPSULES BY MOUTH EVERY DAY, Disp: 90 capsule, Rfl: 11 .  gabapentin (NEURONTIN) 300 MG capsule, TAKE ONE CAPSULE BY MOUTH 3 TIMES A DAY AND TAKE 2 CAPSULES BY MOUTH AT BEDTIME **INCREASE SLOWLY**, Disp: 150 capsule, Rfl: 5 .  HYDROcodone-acetaminophen (NORCO/VICODIN) 5-325 MG tablet, Take 1 tablet by mouth every 6 (six) hours as needed for moderate pain. To be filled after 07/28/2016., Disp: 60 tablet, Rfl: 0 .  morphine (KADIAN) 20 MG 24 hr capsule, Take 1 capsule (20 mg total) by mouth 2 (two) times daily. To be filled after 07/29/2015, Disp: 60 capsule, Rfl: 0 .  NEXIUM 40 MG capsule, TAKE ONE CAPSULE BY MOUTH ONCE A DAY, Disp: 30 capsule, Rfl: 5 .  pravastatin (PRAVACHOL) 40 MG tablet, TAKE 1 TABLET BY MOUTH AT BEDTIME, Disp: 30 tablet, Rfl: 5  Review of Systems  Musculoskeletal: Positive for  back pain and myalgias.  Neurological: Negative.   Psychiatric/Behavioral: Negative.     Social History  Substance Use Topics  . Smoking status: Former Smoker    Packs/day: 1.00    Quit date: 11/08/2015  . Smokeless tobacco: Never Used     Comment: Recently Quit Smoking  . Alcohol use No   Objective:   BP 122/68 (BP Location: Left Arm, Patient Position: Sitting, Cuff Size: Normal)   Pulse 68   Temp 98 F (36.7 C)   Resp 16   Wt 211 lb (95.7 kg)   BMI 35.11 kg/m   Physical Exam   General Appearance:    Alert, cooperative, no distress  Eyes:    PERRL, conjunctiva/corneas clear, EOM's intact       Lungs:     Clear to auscultation bilaterally, respirations unlabored  Heart:    Regular rate and rhythm  Neurologic:   Awake, alert, oriented x 3. No apparent focal neurological           defect.           Assessment & Plan:     1. Pure hypercholesterolemia Has restarted pravastatin and tolerating well.  - Lipid panel - Hepatic function panel  2. Chronic back pain, unspecified back location, unspecified back pain laterality Doing well with scheduled Kadian and prn hydrocodone.  -  HYDROcodone-acetaminophen (NORCO/VICODIN) 5-325 MG tablet; Take 1 tablet by mouth every 6 (six) hours as needed for moderate pain.  Dispense: 60 tablet; Refill: 0 - morphine (KADIAN) 20 MG 24 hr capsule; Take 1 capsule (20 mg total) by mouth 2 (two) times daily.  Dispense: 60 capsule; Refill: 0  3. Anxiety Well controlled on current dose of alprazolam..   Reports she has already received flu vaccine  Return in about 6 months (around 02/20/2017).        Lelon Huh, MD  [facility]

## 2016-08-23 LAB — LIPID PANEL
Chol/HDL Ratio: 4.2 ratio units (ref 0.0–4.4)
Cholesterol, Total: 178 mg/dL (ref 100–199)
HDL: 42 mg/dL (ref >39)
LDL Calculated: 107 mg/dL — ABNORMAL HIGH (ref 0–99)
Triglycerides: 145 mg/dL (ref 0–149)
VLDL Cholesterol Cal: 29 mg/dL (ref 5–40)

## 2016-08-23 LAB — HEPATIC FUNCTION PANEL
ALT: 13 IU/L (ref 0–32)
AST: 15 IU/L (ref 0–40)
Albumin: 4.2 g/dL (ref 3.6–4.8)
Alkaline Phosphatase: 61 IU/L (ref 39–117)
Bilirubin Total: 0.3 mg/dL (ref 0.0–1.2)
Bilirubin, Direct: 0.08 mg/dL (ref 0.00–0.40)
Total Protein: 6.5 g/dL (ref 6.0–8.5)

## 2016-09-04 DIAGNOSIS — N3000 Acute cystitis without hematuria: Secondary | ICD-10-CM | POA: Diagnosis not present

## 2016-10-22 ENCOUNTER — Other Ambulatory Visit: Payer: Self-pay | Admitting: Family Medicine

## 2016-10-22 DIAGNOSIS — Z1231 Encounter for screening mammogram for malignant neoplasm of breast: Secondary | ICD-10-CM

## 2016-11-27 ENCOUNTER — Telehealth: Payer: Self-pay | Admitting: Family Medicine

## 2016-11-27 ENCOUNTER — Other Ambulatory Visit: Payer: Self-pay | Admitting: Family Medicine

## 2016-11-27 DIAGNOSIS — M549 Dorsalgia, unspecified: Principal | ICD-10-CM

## 2016-11-27 DIAGNOSIS — G8929 Other chronic pain: Secondary | ICD-10-CM

## 2016-11-27 NOTE — Telephone Encounter (Signed)
Pt wants refills on her pain medications:  She asked that 3 months be written at a time as discussed at her last visit.  She needs  morphine (KADIAN) 20 MG 24 hr capsule HYDROcodone-acetaminophen (NORCO/VICODIN) 5-325 MG tablet  Pt's call back is [phone]  Thanks, Con Memos

## 2016-11-28 MED ORDER — HYDROCODONE-ACETAMINOPHEN 5-325 MG PO TABS: 1.0000 | ORAL_TABLET | Freq: Four times a day (QID) | ORAL | 0 refills | Status: DC | PRN

## 2016-11-28 MED ORDER — MORPHINE SULFATE ER 20 MG PO CP24: 20.0000 mg | ORAL_CAPSULE | Freq: Two times a day (BID) | ORAL | 0 refills | Status: DC

## 2016-12-23 ENCOUNTER — Ambulatory Visit: Payer: Medicare Other | Attending: Family Medicine

## 2016-12-23 ENCOUNTER — Ambulatory Visit: Payer: Medicare Other

## 2017-01-08 ENCOUNTER — Telehealth: Payer: Self-pay

## 2017-01-08 NOTE — Telephone Encounter (Signed)
Patient is wanting to know the status on the PA for Morphine. She said she had to pay out of pocket last night for RX. She is requesting a call back CB#[phone].

## 2017-01-08 NOTE — Telephone Encounter (Signed)
Please advise? Can not recall if we received a PA for this pt.

## 2017-01-09 NOTE — Telephone Encounter (Signed)
Pt would like a call back today to update her on her PA for morphine (KADIAN) 20 MG 24 hr capsule. Pt stated she dropped the forms off to our office about 3 weeks ago and had to pay out of pocket of her medication. Please advise. Thanks TNP

## 2017-01-09 NOTE — Telephone Encounter (Signed)
PA was denied. Forms are being faxed for an appeal.

## 2017-01-14 ENCOUNTER — Ambulatory Visit (INDEPENDENT_AMBULATORY_CARE_PROVIDER_SITE_OTHER): Payer: Medicare Other | Admitting: Family Medicine

## 2017-01-14 ENCOUNTER — Encounter: Payer: Self-pay | Admitting: Family Medicine

## 2017-01-14 VITALS — BP 122/78 | HR 77 | Temp 98.0°F | Resp 18 | Wt 210.0 lb

## 2017-01-14 DIAGNOSIS — F419 Anxiety disorder, unspecified: Secondary | ICD-10-CM | POA: Diagnosis not present

## 2017-01-14 DIAGNOSIS — G8929 Other chronic pain: Secondary | ICD-10-CM

## 2017-01-14 DIAGNOSIS — M545 Low back pain: Secondary | ICD-10-CM

## 2017-01-14 DIAGNOSIS — M7551 Bursitis of right shoulder: Secondary | ICD-10-CM

## 2017-01-14 MED ORDER — NAPROXEN 500 MG PO TABS: 500.0000 mg | ORAL_TABLET | Freq: Two times a day (BID) | ORAL | 0 refills | Status: DC

## 2017-01-14 MED ORDER — MORPHINE SULFATE ER 15 MG PO TBCR: 15.0000 mg | EXTENDED_RELEASE_TABLET | Freq: Three times a day (TID) | ORAL | 0 refills | Status: DC

## 2017-01-14 NOTE — Progress Notes (Signed)
Patient: Alejandra Little Female    DOB: 03-02-1956   61 y.o.   MRN: [id number] Visit Date: 01/14/2017  Today's Provider: Lelon Huh, MD   Chief Complaint  Patient presents with  . Pain   Subjective:    HPI Chronic pain:  Patient comes in today to discuss treatment options for her chronic back pain. Patient is currently taking schedule morphine 52m BID which she has taken for many years, but has learned that this medication is not covered on her insurance plan. She also takes prn hydrocodone/apap, but states it does not work as well for muscle spasms. Pain had been adequately controlled. She filled rx for morphine last week, but had to pay it all out of pocket. She also reports her right shoulder has been sore for the last couple of weeks, but does not recal any injuries.   She also requires follow up for anxiety and depression. States that fluoxetine is working well without adverse effects. She usually requires either alprazolam and gabapentin at bedtime to help sleep, but does not take both.     No Known Allergies   Current Outpatient Prescriptions:  .  ALPRAZolam (XANAX) 0.5 MG tablet, TAKE 1 TABLET BY MOUTH AT BEDTIME, Disp: 30 tablet, Rfl: 5 .  FLUoxetine (PROZAC) 20 MG capsule, TAKE 3 CAPSULES BY MOUTH EVERY DAY, Disp: 90 capsule, Rfl: 11 .  gabapentin (NEURONTIN) 300 MG capsule, TAKE ONE CAPSULE BY MOUTH 3 TIMES A DAY AND TAKE 2 CAPSULES BY MOUTH AT BEDTIME, Disp: 150 capsule, Rfl: 11 .  HYDROcodone-acetaminophen (NORCO/VICODIN) 5-325 MG tablet, Take 1 tablet by mouth every 6 (six) hours as needed for moderate pain., Disp: 60 tablet, Rfl: 0 .  morphine (KADIAN) 20 MG 24 hr capsule, Take 1 capsule (20 mg total) by mouth 2 (two) times daily., Disp: 60 capsule, Rfl: 0 .  NEXIUM 40 MG capsule, TAKE ONE CAPSULE BY MOUTH ONCE A DAY, Disp: 30 capsule, Rfl: 5 .  pravastatin (PRAVACHOL) 40 MG tablet, TAKE 1 TABLET BY MOUTH AT BEDTIME, Disp: 30 tablet, Rfl: 5  Review of Systems    Constitutional: Negative for appetite change, chills, fatigue and fever.  Respiratory: Negative for chest tightness and shortness of breath.   Cardiovascular: Negative for chest pain and palpitations.  Gastrointestinal: Negative for abdominal pain, nausea and vomiting.  Musculoskeletal: Positive for back pain.  Neurological: Negative for dizziness and weakness.    Social History  Substance Use Topics  . Smoking status: Former Smoker    Packs/day: 1.00    Years: 24.00    Start date: 56    Quit date: 11/08/2015  . Smokeless tobacco: Never Used     Comment: Recently Quit Smoking  . Alcohol use No   Objective:   BP 122/78 (BP Location: Left Arm, Patient Position: Sitting, Cuff Size: Large)   Pulse 77   Temp 98 F (36.7 C) (Oral)   Resp 18   Wt 210 lb (95.3 kg)   SpO2 98% Comment: room air  BMI 34.95 kg/m   Physical Exam   General Appearance:    Alert, cooperative, no distress  Eyes:    PERRL, conjunctiva/corneas clear, EOM's intact       Lungs:     Clear to auscultation bilaterally, respirations unlabored  Heart:    Regular rate and rhythm  Neurologic:   Awake, alert, oriented x 3. No apparent focal neurological           defect.  MS:  Tender superior aspect of right shoulder.        Assessment & Plan:     1. Chronic low back pain, unspecified back pain laterality, with sciatica presence unspecified Kadian no loner covered by insurance. Her insurance company did not give any alternatives, will getneric MSContin 15Q8.  - morphine (MS CONTIN) 15 MG 12 hr tablet; Take 1 tablet (15 mg total) by mouth every 8 (eight) hours.  Dispense: 90 tablet; Refill: 0  2. Anxiety Stable on current dose of fluoxetine.   3. Bursitis of right shoulder Counseled on oral NSAID versus steroid injection, will try naprosyn but she can call back for orthopedic referral if not effective.  - naproxen (NAPROSYN) 500 MG tablet; Take 1 tablet (500 mg total) by mouth 2 (two) times daily with a  meal.  Dispense: 30 tablet; Refill: 0       Lelon Huh, MD  [facility]

## 2017-01-14 NOTE — Telephone Encounter (Signed)
Dr. Caryn Section, we received notification through fax this morning from her insurance that they would not be covering this medication.  Patients insurance sent back the PA from we filled out and informed us that they will not process any PA we fill out for this medication because they will not cover it.  A copy of this letter is on your desk to review.

## 2017-01-24 ENCOUNTER — Other Ambulatory Visit: Payer: Self-pay | Admitting: Family Medicine

## 2017-01-24 DIAGNOSIS — E785 Hyperlipidemia, unspecified: Secondary | ICD-10-CM

## 2017-01-25 ENCOUNTER — Other Ambulatory Visit: Payer: Self-pay | Admitting: Family Medicine

## 2017-01-25 DIAGNOSIS — M7551 Bursitis of right shoulder: Secondary | ICD-10-CM

## 2017-02-10 ENCOUNTER — Telehealth: Payer: Self-pay | Admitting: Family Medicine

## 2017-02-10 ENCOUNTER — Other Ambulatory Visit: Payer: Self-pay | Admitting: Family Medicine

## 2017-02-10 DIAGNOSIS — K219 Gastro-esophageal reflux disease without esophagitis: Secondary | ICD-10-CM

## 2017-02-10 NOTE — Telephone Encounter (Signed)
Pt stated that she spoke with her insurance and they are requiring a PA for morphine (MS CONTIN) 15 MG 12 hr tablet. Pt request that we get started on this b/c she understands that it is a process and she doesn't want to go without her medication. Pt stated that she was advised we needed to contact the insurance via [phone]. Pt would like an update on the progress of the PA if possible. Please advise. Thanks TNP

## 2017-02-10 NOTE — Telephone Encounter (Signed)
Please advise 

## 2017-02-11 NOTE — Telephone Encounter (Signed)
Please call for prior auth

## 2017-02-12 NOTE — Telephone Encounter (Signed)
PA was approved. 

## 2017-02-25 ENCOUNTER — Ambulatory Visit: Payer: Self-pay | Admitting: Family Medicine

## 2017-03-03 ENCOUNTER — Other Ambulatory Visit: Payer: Self-pay | Admitting: Family Medicine

## 2017-03-03 DIAGNOSIS — M549 Dorsalgia, unspecified: Principal | ICD-10-CM

## 2017-03-03 DIAGNOSIS — G8929 Other chronic pain: Secondary | ICD-10-CM

## 2017-03-03 MED ORDER — HYDROCODONE-ACETAMINOPHEN 5-325 MG PO TABS: 1.0000 | ORAL_TABLET | Freq: Four times a day (QID) | ORAL | 0 refills | Status: DC | PRN

## 2017-03-03 NOTE — Telephone Encounter (Signed)
Pt contacted office for refill request on the following medications:  CB#[phone]/[id number]  HYDROcodone-acetaminophen (NORCO/VICODIN) 5-325 MG tablet  morphine (MS CONTIN) 15 MG 12 hr tablet

## 2017-03-06 ENCOUNTER — Telehealth: Payer: Self-pay | Admitting: Family Medicine

## 2017-03-06 DIAGNOSIS — G8929 Other chronic pain: Secondary | ICD-10-CM

## 2017-03-06 DIAGNOSIS — M545 Low back pain: Principal | ICD-10-CM

## 2017-03-06 MED ORDER — MORPHINE SULFATE ER 15 MG PO TBCR: 15.0000 mg | EXTENDED_RELEASE_TABLET | Freq: Three times a day (TID) | ORAL | 0 refills | Status: DC

## 2017-03-06 NOTE — Telephone Encounter (Signed)
Patient needs refill on Morphine RX

## 2017-03-17 ENCOUNTER — Other Ambulatory Visit: Payer: Self-pay | Admitting: Family Medicine

## 2017-03-17 DIAGNOSIS — F419 Anxiety disorder, unspecified: Secondary | ICD-10-CM

## 2017-03-18 ENCOUNTER — Ambulatory Visit (INDEPENDENT_AMBULATORY_CARE_PROVIDER_SITE_OTHER): Payer: Medicare Other

## 2017-03-18 VITALS — BP 128/78 | HR 72 | Temp 98.6°F | Ht 65.0 in | Wt 211.8 lb

## 2017-03-18 DIAGNOSIS — Z1159 Encounter for screening for other viral diseases: Secondary | ICD-10-CM | POA: Diagnosis not present

## 2017-03-18 DIAGNOSIS — Z0000 Encounter for general adult medical examination without abnormal findings: Secondary | ICD-10-CM | POA: Diagnosis not present

## 2017-03-18 DIAGNOSIS — Z114 Encounter for screening for human immunodeficiency virus [HIV]: Secondary | ICD-10-CM | POA: Diagnosis not present

## 2017-03-18 NOTE — Telephone Encounter (Signed)
Rx called in to pharmacy. 

## 2017-03-18 NOTE — Patient Instructions (Signed)
Alejandra Little , Thank you for taking time to come for your Medicare Wellness Visit. I appreciate your ongoing commitment to your health goals. Please review the following plan we discussed and let me know if I can assist you in the future.   Screening recommendations/referrals: Colonoscopy: completed 12/14/12, due 11/2022 Mammogram: completed 11/14/14, patient to set up this year (2018) Bone Density: N/A Recommended yearly ophthalmology/optometry visit for glaucoma screening and checkup Recommended yearly dental visit for hygiene and checkup  Vaccinations: Influenza vaccine: due 07/2017 Pneumococcal vaccine: Prevnar 13 due 10/2019 Tdap vaccine: completed 09/07/15, due 08/2025 Shingles vaccine: declined today    Advanced directives: Discussed at visit today. Pt declined set up. Advised patient to contact office for information when ready to set this up.  Conditions/risks identified: Recommend increasing water intake to at least 3 glasses a day.  Next appointment: None, need to schedule 1 year AWV.  Preventive Care 40-64 Years, Female Preventive care refers to lifestyle choices and visits with your health care provider that can promote health and wellness. What does preventive care include?  A yearly physical exam. This is also called an annual well check.  Dental exams once or twice a year.  Routine eye exams. Ask your health care provider how often you should have your eyes checked.  Personal lifestyle choices, including:  Daily care of your teeth and gums.  Regular physical activity.  Eating a healthy diet.  Avoiding tobacco and drug use.  Limiting alcohol use.  Practicing safe sex.  Taking low-dose aspirin daily starting at age 66.  Taking vitamin and mineral supplements as recommended by your health care provider. What happens during an annual well check? The services and screenings done by your health care provider during your annual well check will depend on your  age, overall health, lifestyle risk factors, and family history of disease. Counseling  Your health care provider may ask you questions about your:  Alcohol use.  Tobacco use.  Drug use.  Emotional well-being.  Home and relationship well-being.  Sexual activity.  Eating habits.  Work and work Statistician.  Method of birth control.  Menstrual cycle.  Pregnancy history. Screening  You may have the following tests or measurements:  Height, weight, and BMI.  Blood pressure.  Lipid and cholesterol levels. These may be checked every 5 years, or more frequently if you are over 17 years old.  Skin check.  Lung cancer screening. You may have this screening every year starting at age 18 if you have a 30-pack-year history of smoking and currently smoke or have quit within the past 15 years.  Fecal occult blood test (FOBT) of the stool. You may have this test every year starting at age 34.  Flexible sigmoidoscopy or colonoscopy. You may have a sigmoidoscopy every 5 years or a colonoscopy every 10 years starting at age 39.  Hepatitis C blood test.  Hepatitis B blood test.  Sexually transmitted disease (STD) testing.  Diabetes screening. This is done by checking your blood sugar (glucose) after you have not eaten for a while (fasting). You may have this done every 1-3 years.  Mammogram. This may be done every 1-2 years. Talk to your health care provider about when you should start having regular mammograms. This may depend on whether you have a family history of breast cancer.  BRCA-related cancer screening. This may be done if you have a family history of breast, ovarian, tubal, or peritoneal cancers.  Pelvic exam and Pap test. This may be  done every 3 years starting at age 53. Starting at age 67, this may be done every 5 years if you have a Pap test in combination with an HPV test.  Bone density scan. This is done to screen for osteoporosis. You may have this scan if you  are at high risk for osteoporosis. Discuss your test results, treatment options, and if necessary, the need for more tests with your health care provider. Vaccines  Your health care provider may recommend certain vaccines, such as:  Influenza vaccine. This is recommended every year.  Tetanus, diphtheria, and acellular pertussis (Tdap, Td) vaccine. You may need a Td booster every 10 years.  Zoster vaccine. You may need this after age 32.  Pneumococcal 13-valent conjugate (PCV13) vaccine. You may need this if you have certain conditions and were not previously vaccinated.  Pneumococcal polysaccharide (PPSV23) vaccine. You may need one or two doses if you smoke cigarettes or if you have certain conditions. Talk to your health care provider about which screenings and vaccines you need and how often you need them. This information is not intended to replace advice given to you by your health care provider. Make sure you discuss any questions you have with your health care provider. Document Released: 12/01/2015 Document Revised: 07/24/2016 Document Reviewed: 09/05/2015 Elsevier Interactive Patient Education  2017 [place] Prevention in the Home Falls can cause injuries. They can happen to people of all ages. There are many things you can do to make your home safe and to help prevent falls. What can I do on the outside of my home?  Regularly fix the edges of walkways and driveways and fix any cracks.  Remove anything that might make you trip as you walk through a door, such as a raised step or threshold.  Trim any bushes or trees on the path to your home.  Use bright outdoor lighting.  Clear any walking paths of anything that might make someone trip, such as rocks or tools.  Regularly check to see if handrails are loose or broken. Make sure that both sides of any steps have handrails.  Any raised decks and porches should have guardrails on the edges.  Have any leaves,  snow, or ice cleared regularly.  Use sand or salt on walking paths during winter.  Clean up any spills in your garage right away. This includes oil or grease spills. What can I do in the bathroom?  Use night lights.  Install grab bars by the toilet and in the tub and shower. Do not use towel bars as grab bars.  Use non-skid mats or decals in the tub or shower.  If you need to sit down in the shower, use a plastic, non-slip stool.  Keep the floor dry. Clean up any water that spills on the floor as soon as it happens.  Remove soap buildup in the tub or shower regularly.  Attach bath mats securely with double-sided non-slip rug tape.  Do not have throw rugs and other things on the floor that can make you trip. What can I do in the bedroom?  Use night lights.  Make sure that you have a light by your bed that is easy to reach.  Do not use any sheets or blankets that are too big for your bed. They should not hang down onto the floor.  Have a firm chair that has side arms. You can use this for support while you get dressed.  Do not  have throw rugs and other things on the floor that can make you trip. What can I do in the kitchen?  Clean up any spills right away.  Avoid walking on wet floors.  Keep items that you use a lot in easy-to-reach places.  If you need to reach something above you, use a strong step stool that has a grab bar.  Keep electrical cords out of the way.  Do not use floor polish or wax that makes floors slippery. If you must use wax, use non-skid floor wax.  Do not have throw rugs and other things on the floor that can make you trip. What can I do with my stairs?  Do not leave any items on the stairs.  Make sure that there are handrails on both sides of the stairs and use them. Fix handrails that are broken or loose. Make sure that handrails are as long as the stairways.  Check any carpeting to make sure that it is firmly attached to the stairs. Fix any  carpet that is loose or worn.  Avoid having throw rugs at the top or bottom of the stairs. If you do have throw rugs, attach them to the floor with carpet tape.  Make sure that you have a light switch at the top of the stairs and the bottom of the stairs. If you do not have them, ask someone to add them for you. What else can I do to help prevent falls?  Wear shoes that:  Do not have high heels.  Have rubber bottoms.  Are comfortable and fit you well.  Are closed at the toe. Do not wear sandals.  If you use a stepladder:  Make sure that it is fully opened. Do not climb a closed stepladder.  Make sure that both sides of the stepladder are locked into place.  Ask someone to hold it for you, if possible.  Clearly mark and make sure that you can see:  Any grab bars or handrails.  First and last steps.  Where the edge of each step is.  Use tools that help you move around (mobility aids) if they are needed. These include:  Canes.  Walkers.  Scooters.  Crutches.  Turn on the lights when you go into a dark area. Replace any light bulbs as soon as they burn out.  Set up your furniture so you have a clear path. Avoid moving your furniture around.  If any of your floors are uneven, fix them.  If there are any pets around you, be aware of where they are.  Review your medicines with your doctor. Some medicines can make you feel dizzy. This can increase your chance of falling. Ask your doctor what other things that you can do to help prevent falls. This information is not intended to replace advice given to you by your health care provider. Make sure you discuss any questions you have with your health care provider. Document Released: 08/31/2009 Document Revised: 04/11/2016 Document Reviewed: 12/09/2014 Elsevier Interactive Patient Education  2017 [employer].

## 2017-03-18 NOTE — Telephone Encounter (Signed)
Please call in alprazolam.  

## 2017-03-18 NOTE — Progress Notes (Signed)
Subjective:   Alejandra Little is a 61 y.o. female who presents for Medicare Annual (Subsequent) preventive examination.  Review of Systems:  N/A  Cardiac Risk Factors include: advanced age (>48men, >16 women);dyslipidemia;obesity (BMI >30kg/m2)     Objective:     Vitals: BP 128/78 (BP Location: Right Arm)   Pulse 72   Temp 98.6 F (37 C) (Oral)   Ht 5\' 5"  (1.651 m)   Wt 211 lb 12.8 oz (96.1 kg)   BMI 35.25 kg/m   Body mass index is 35.25 kg/m.   Tobacco History  Smoking Status  . Former Smoker  . Packs/day: 1.00  . Years: 24.00  . Types: Cigarettes  . Start date: 61  . Quit date: 11/08/2015  Smokeless Tobacco  . Never Used     Counseling given: Not Answered   Past Medical History:  Diagnosis Date  . Anxiety   . GERD (gastroesophageal reflux disease)   . Hyperlipidemia    Past Surgical History:  Procedure Laterality Date  . ABDOMINAL HYSTERECTOMY  1999   Fibroids  . CHOLECYSTECTOMY  2004  . LITHOTRIPSY     Family History  Problem Relation Age of Onset  . Lung cancer Mother   . Lung cancer Father   . Hypertension Sister   . COPD Sister   . COPD Sister    History  Sexual Activity  . Sexual activity: Yes    Outpatient Encounter Prescriptions as of 03/18/2017  Medication Sig  . ALPRAZolam (XANAX) 0.5 MG tablet TAKE 1 TABLET BY MOUTH AT BEDTIME  . FLUoxetine (PROZAC) 20 MG capsule TAKE 3 CAPSULES BY MOUTH EVERY DAY  . gabapentin (NEURONTIN) 300 MG capsule TAKE ONE CAPSULE BY MOUTH 3 TIMES A DAY AND TAKE 2 CAPSULES BY MOUTH AT BEDTIME  . HYDROcodone-acetaminophen (NORCO/VICODIN) 5-325 MG tablet Take 1 tablet by mouth every 6 (six) hours as needed for moderate pain.  Marland Kitchen morphine (MS CONTIN) 15 MG 12 hr tablet Take 1 tablet (15 mg total) by mouth every 8 (eight) hours.  . naproxen (NAPROSYN) 500 MG tablet TAKE 1 TABLET (500 MG TOTAL) BY MOUTH 2 (TWO) TIMES DAILY WITH A MEAL. (Patient taking differently: Take 500 mg by mouth 2 (two) times daily with a  meal. )  . NEXIUM 40 MG capsule TAKE ONE CAPSULE BY MOUTH ONCE A DAY  . pravastatin (PRAVACHOL) 40 MG tablet TAKE 1 TABLET BY MOUTH AT BEDTIME   No facility-administered encounter medications on file as of 03/18/2017.     Activities of Daily Living In your present state of health, do you have any difficulty performing the following activities: 03/18/2017  Hearing? N  Vision? N  Difficulty concentrating or making decisions? N  Walking or climbing stairs? N  Dressing or bathing? N  Doing errands, shopping? N  Preparing Food and eating ? N  Using the Toilet? N  In the past six months, have you accidently leaked urine? N  Do you have problems with loss of bowel control? N  Managing your Medications? N  Managing your Finances? N  Housekeeping or managing your Housekeeping? N  Some recent data might be hidden    Patient Care Team: [name], MD as PCP - General (Family Medicine)    Assessment:     Exercise Activities and Dietary recommendations Current Exercise Habits: Home exercise routine, Type of exercise: walking, Time (Minutes): 30, Frequency (Times/Week): 2, Weekly Exercise (Minutes/Week): 60, Intensity: Mild, Exercise limited by: orthopedic condition(s)  Goals    .  Increase water intake          Recommend increasing water intake to 3 glasses a day.      Fall Risk Fall Risk  03/18/2017 02/28/2016 06/08/2015 06/07/2015  Falls in the past year? No Yes No No  Number falls in past yr: - 1 - -  Injury with Fall? - No - -  Risk for fall due to : - Medication side effect - -  Follow up - Falls evaluation completed - -   Depression Screen PHQ 2/9 Scores 03/18/2017 05/29/2016 02/28/2016 06/08/2015  PHQ - 2 Score 4 5 4  0  PHQ- 9 Score 9 11 8  -     Cognitive Function     6CIT Screen 03/18/2017  What Year? 0 points  What month? 0 points  What time? 0 points  Count back from 20 0 points  Months in reverse 0 points  Repeat phrase 0 points  Total Score 0    Immunization  History  Administered Date(s) Administered  . Influenza,inj,Quad PF,36+ Mos 09/07/2015  . Tdap 09/07/2015   Screening Tests Health Maintenance  Topic Date Due  . MAMMOGRAM  03/18/2017 (Originally 11/14/2016)  . INFLUENZA VACCINE  06/18/2017  . COLONOSCOPY  12/14/2022  . TETANUS/TDAP  09/06/2025  . Hepatitis C Screening  Completed  . HIV Screening  Completed  . PAP SMEAR  Excluded      Plan:  I have personally reviewed and addressed the Medicare Annual Wellness questionnaire and have noted the following in the patient's chart:  A. Medical and social history B. Use of alcohol, tobacco or illicit drugs  C. Current medications and supplements D. Functional ability and status E.  Nutritional status F.  Physical activity G. Advance directives H. List of other physicians I.  Hospitalizations, surgeries, and ER visits in previous 12 months J.  [place] such as hearing and vision if needed, cognitive and depression L. Referrals and appointments - none  In addition, I have reviewed and discussed with patient certain preventive protocols, quality metrics, and best practice recommendations. A written personalized care plan for preventive services as well as general preventive health recommendations were provided to patient.  See attached scanned questionnaire for additional information.   Signed,  Fabio Neighbors, LPN Nurse Health Advisor   MD Recommendations: None. Pt to set up mammogram this year (2018). I have reviewed the health advisor's note, was available for consultation, and agree with documentation and plan  Lelon Huh, MD

## 2017-03-19 LAB — HEPATITIS C ANTIBODY: Hep C Virus Ab: <0.1 s/co ratio (ref 0.0–0.9)

## 2017-03-19 LAB — HIV ANTIBODY (ROUTINE TESTING W REFLEX): HIV Screen 4th Generation wRfx: NONREACTIVE

## 2017-05-28 ENCOUNTER — Ambulatory Visit (INDEPENDENT_AMBULATORY_CARE_PROVIDER_SITE_OTHER): Payer: Medicare Other | Admitting: Family Medicine

## 2017-05-28 ENCOUNTER — Encounter: Payer: Self-pay | Admitting: Family Medicine

## 2017-05-28 VITALS — BP 122/68 | HR 72 | Temp 97.8°F | Resp 16 | Ht 65.0 in | Wt 209.0 lb

## 2017-05-28 DIAGNOSIS — G8929 Other chronic pain: Secondary | ICD-10-CM

## 2017-05-28 DIAGNOSIS — M545 Low back pain: Secondary | ICD-10-CM | POA: Diagnosis not present

## 2017-05-28 DIAGNOSIS — F419 Anxiety disorder, unspecified: Secondary | ICD-10-CM

## 2017-05-28 DIAGNOSIS — Z6834 Body mass index (BMI) 34.0-34.9, adult: Secondary | ICD-10-CM

## 2017-05-28 MED ORDER — MORPHINE SULFATE ER 15 MG PO TBCR: 15.0000 mg | EXTENDED_RELEASE_TABLET | Freq: Three times a day (TID) | ORAL | 0 refills | Status: DC

## 2017-05-28 MED ORDER — MORPHINE SULFATE ER 15 MG PO TBCR
15.0000 mg | EXTENDED_RELEASE_TABLET | Freq: Three times a day (TID) | ORAL | 0 refills | Status: DC
Start: 2017-05-28 — End: 2017-09-01

## 2017-05-28 NOTE — Patient Instructions (Signed)

## 2017-05-28 NOTE — Progress Notes (Signed)
Patient: Alejandra Little Female    DOB: 1956-06-07   61 y.o.   MRN: [id number] Visit Date: 05/28/2017  Today's Provider: Lelon Huh, MD   Chief Complaint  Patient presents with  . Pain   Subjective:    HPI  Anxiety, follow up: Patient was last seen in the office 5 months ago. No changes were made in her medications. She is currently taking Fluoxetine 3 x 20mg  daily. She reports good symptom control.  Chronic pain, follow up: Patient was last seen in the office 5 months ago. Since then, her pain medication was changed to generic MS Contin due to insurance. She is now experiencing left hip pain. She states she wants to get off pain medications but pain is too severe. She has been follow up at pain clinic past and reports no prolonged relief from injections. Has also tried Cymbalta in the past and states it didn't help her at all, but fluoxetine is helping a lot. She states gapapentin helps, but it makes her sleepy and doesn't think she could tolerate a higher dose. She states she has had several rounds of PT with no improved. Last MRI lumbar spine was 12/01/2008 IMPRESSION:   1. 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       No Known Allergies   Current Outpatient Prescriptions:  .  ALPRAZolam (XANAX) 0.5 MG tablet, TAKE 1 TABLET BY MOUTH AT BEDTIME, Disp: 30 tablet, Rfl: 5 .  FLUoxetine (PROZAC) 20 MG capsule, TAKE 3 CAPSULES BY MOUTH EVERY DAY, Disp: 90 capsule, Rfl: 11 .  gabapentin (NEURONTIN) 300 MG capsule, TAKE ONE CAPSULE BY MOUTH 3 TIMES A DAY AND TAKE 2 CAPSULES BY MOUTH AT BEDTIME, Disp: 150 capsule, Rfl: 11 .  HYDROcodone-acetaminophen (NORCO/VICODIN) 5-325 MG tablet, Take 1 tablet by mouth every 6 (six) hours as needed for moderate pain., Disp: 60 tablet, Rfl: 0 .  morphine (MS  CONTIN) 15 MG 12 hr tablet, Take 1 tablet (15 mg total) by mouth every 8 (eight) hours., Disp: 90 tablet, Rfl: 0 .  NEXIUM 40 MG capsule, TAKE ONE CAPSULE BY MOUTH ONCE A DAY, Disp: 30 capsule, Rfl: 12 .  pravastatin (PRAVACHOL) 40 MG tablet, TAKE 1 TABLET BY MOUTH AT BEDTIME, Disp: 30 tablet, Rfl: 12 .  naproxen (NAPROSYN) 500 MG tablet, TAKE 1 TABLET (500 MG TOTAL) BY MOUTH 2 (TWO) TIMES DAILY WITH A MEAL. (Patient not taking: Reported on 05/28/2017), Disp: 30 tablet, Rfl: 5  Review of Systems  Constitutional: Negative.   Respiratory: Negative.   Cardiovascular: Negative for chest pain, palpitations and leg swelling.  Musculoskeletal: Positive for arthralgias, back pain and myalgias.  Neurological: Negative.     Social History  Substance Use Topics  . Smoking status: Former Smoker    Packs/day: 1.00    Years: 24.00    Types: Cigarettes    Start date: 20    Quit date: 11/08/2015  . Smokeless tobacco: Never Used  . Alcohol use No   Objective:   BP 122/68 (BP Location: Right Arm, Patient Position: Sitting, Cuff Size: Normal)   Pulse 72   Temp 97.8 F (36.6 C)   Resp 16   Ht 5\' 5"  (1.651 m)   Wt 209 lb (94.8 kg)   SpO2 96%   BMI 34.78 kg/m  Vitals:   05/28/17 1113  BP: 122/68  Pulse: 72  Resp: 16  Temp: 97.8 F (36.6 C)  SpO2: 96%  Weight: 209 lb (94.8 kg)  Height: 5\' 5"  (1.651 m)     Physical Exam  General appearance: alert, well developed, well nourished, cooperative and in no distress Head: Normocephalic, without obvious abnormality, atraumatic Respiratory: Respirations even and unlabored, normal respiratory rate Extremities: No gross deformities Skin: Skin color, texture, turgor normal. No rashes seen  MS: Diffuse tenderness lumbar and para lumbar spine. No LE weakness.      Assessment & Plan:     1. Chronic low back pain, unspecified back pain laterality, with sciatica presence unspecified Stable on current dose of MS Contin, she reports multiple other  modalities of treatment that were not beneficial. Continue current pain medication regiment.  - morphine (MS CONTIN) 15 MG 12 hr tablet; Take 1 tablet (15 mg total) by mouth every 8 (eight) hours.  Dispense: 90 tablet; Refill: 0  2. Anxiety Doing well with 60mg  fluoxetine a day which she wishes to continue.   3. BMI 34.0-34.9,adult Would benefit from increasing physical activity which she anticipates doing over the next few months.        Lelon Huh, MD  [facility]

## 2017-06-14 ENCOUNTER — Other Ambulatory Visit: Payer: Self-pay | Admitting: Family Medicine

## 2017-06-14 DIAGNOSIS — F419 Anxiety disorder, unspecified: Secondary | ICD-10-CM

## 2017-09-01 ENCOUNTER — Telehealth: Payer: Self-pay | Admitting: Family Medicine

## 2017-09-01 ENCOUNTER — Other Ambulatory Visit: Payer: Self-pay

## 2017-09-01 DIAGNOSIS — M545 Low back pain: Principal | ICD-10-CM

## 2017-09-01 DIAGNOSIS — G8929 Other chronic pain: Secondary | ICD-10-CM

## 2017-09-01 MED ORDER — MORPHINE SULFATE ER 15 MG PO TBCR: 15.0000 mg | EXTENDED_RELEASE_TABLET | Freq: Three times a day (TID) | ORAL | 0 refills | Status: DC

## 2017-09-01 NOTE — Telephone Encounter (Signed)
Patient called down to [facility] on this morning (09/01/17 @ 10:25am) due to not being able to reach the [facility].  Patient stated that she is needing a refill on some of her medications.  Patient stated if someone could call her back she can give a list of the medications she is needing.  Her call back # is [phone].

## 2017-09-01 NOTE — Telephone Encounter (Signed)
Message was sent to Dr. Caryn Section for medication refill.

## 2017-09-01 NOTE — Telephone Encounter (Signed)
I advised patient that I will call her back once it was ready.

## 2017-09-01 NOTE — Telephone Encounter (Signed)
Patient reports that she will run out tonight. She has called since Friday for a refill. Thanks!

## 2017-09-02 ENCOUNTER — Telehealth: Payer: Self-pay | Admitting: Family Medicine

## 2017-09-02 NOTE — Telephone Encounter (Signed)
Error.  Disregard this message. Meds already refilled

## 2017-09-02 NOTE — Telephone Encounter (Signed)
Patient needs refill  TODAY and is waiting in lobby for

## 2017-09-23 DIAGNOSIS — Z23 Encounter for immunization: Secondary | ICD-10-CM | POA: Diagnosis not present

## 2017-11-05 ENCOUNTER — Other Ambulatory Visit: Payer: Self-pay | Admitting: Family Medicine

## 2017-11-05 DIAGNOSIS — F419 Anxiety disorder, unspecified: Secondary | ICD-10-CM

## 2017-11-05 NOTE — Telephone Encounter (Signed)
Pharmacy requesting refills. Thanks!  

## 2017-11-28 ENCOUNTER — Encounter: Payer: Self-pay | Admitting: Family Medicine

## 2017-11-28 ENCOUNTER — Ambulatory Visit (INDEPENDENT_AMBULATORY_CARE_PROVIDER_SITE_OTHER): Payer: Medicare Other | Admitting: Family Medicine

## 2017-11-28 VITALS — BP 110/70 | HR 67 | Temp 98.1°F | Resp 16 | Ht 65.0 in | Wt 219.0 lb

## 2017-11-28 DIAGNOSIS — Z87891 Personal history of nicotine dependence: Secondary | ICD-10-CM | POA: Diagnosis not present

## 2017-11-28 DIAGNOSIS — M549 Dorsalgia, unspecified: Secondary | ICD-10-CM

## 2017-11-28 DIAGNOSIS — G8929 Other chronic pain: Secondary | ICD-10-CM | POA: Diagnosis not present

## 2017-11-28 DIAGNOSIS — M545 Low back pain: Secondary | ICD-10-CM

## 2017-11-28 DIAGNOSIS — M5441 Lumbago with sciatica, right side: Secondary | ICD-10-CM

## 2017-11-28 DIAGNOSIS — Z6834 Body mass index (BMI) 34.0-34.9, adult: Secondary | ICD-10-CM | POA: Diagnosis not present

## 2017-11-28 DIAGNOSIS — E7800 Pure hypercholesterolemia, unspecified: Secondary | ICD-10-CM

## 2017-11-28 MED ORDER — MORPHINE SULFATE ER 15 MG PO TBCR: 15.0000 mg | EXTENDED_RELEASE_TABLET | Freq: Three times a day (TID) | ORAL | 0 refills | Status: DC

## 2017-11-28 MED ORDER — HYDROCODONE-ACETAMINOPHEN 5-325 MG PO TABS: 1.0000 | ORAL_TABLET | Freq: Four times a day (QID) | ORAL | 0 refills | Status: DC | PRN

## 2017-11-28 MED ORDER — GABAPENTIN 300 MG PO CAPS: ORAL_CAPSULE | ORAL | 11 refills | Status: DC

## 2017-11-28 NOTE — Progress Notes (Signed)
Patient: Alejandra Little Female    DOB: 1956/07/20   62 y.o.   MRN: [id number] Visit Date: 11/28/2017  Today's Provider: Lelon Huh, MD   Chief Complaint  Patient presents with  . Hyperlipidemia  . Anxiety  . Back Pain   Subjective:    HPI  Chronic low back pain, unspecified back pain laterality, with sciatica presence unspecified From 05/28/2017-no changes. Refilled morphine (MS CONTIN) 15 MG 12 hr tablet. Still has daily pain, but has been table on current medication regiment. Doesn't feel she needs any changes.   Anxiety From 05/28/2017-no changes. Doing well with 60mg  fluoxetine daily.  Follow up hyperlipidemia. Is taking pravastatin consistently. Denies any adverse effect from medication. No chest pains, no dyspnea, no new muscle pains.  Lab Results  Component Value Date   CHOL 178 08/22/2016   CHOL 259 (H) 02/28/2016   CHOL 251 (H) 06/07/2015   Lab Results  Component Value Date   HDL 42 08/22/2016   HDL 43 02/28/2016   HDL 42 06/07/2015   Lab Results  Component Value Date   LDLCALC 107 (H) 08/22/2016   LDLCALC 176 (H) 02/28/2016   LDLCALC 175 (H) 06/07/2015   Lab Results  Component Value Date   TRIG 145 08/22/2016   TRIG 198 (H) 02/28/2016   TRIG 170 (H) 06/07/2015   Lab Results  Component Value Date   CHOLHDL 4.2 08/22/2016   CHOLHDL 6.0 (H) 02/28/2016   CHOLHDL 6.0 (H) 06/07/2015   No results found for: LDLDIRECT   No Known Allergies   Current Outpatient Medications:  .  ALPRAZolam (XANAX) 0.5 MG tablet, TAKE 1 TABLET BY MOUTH AT BEDTIME, Disp: 30 tablet, Rfl: 5 .  FLUoxetine (PROZAC) 20 MG capsule, TAKE 3 CAPSULES BY MOUTH EVERY DAY, Disp: 90 capsule, Rfl: 11 .  gabapentin (NEURONTIN) 300 MG capsule, TAKE ONE CAPSULE BY MOUTH 3 TIMES A DAY AND TAKE 2 CAPSULES BY MOUTH AT BEDTIME, Disp: 150 capsule, Rfl: 11 .  HYDROcodone-acetaminophen (NORCO/VICODIN) 5-325 MG tablet, Take 1 tablet by mouth every 6 (six) hours as needed for moderate  pain., Disp: 60 tablet, Rfl: 0 .  morphine (MS CONTIN) 15 MG 12 hr tablet, Take 1 tablet (15 mg total) by mouth every 8 (eight) hours., Disp: 90 tablet, Rfl: 0 .  naproxen (NAPROSYN) 500 MG tablet, TAKE 1 TABLET (500 MG TOTAL) BY MOUTH 2 (TWO) TIMES DAILY WITH A MEAL., Disp: 30 tablet, Rfl: 5 .  NEXIUM 40 MG capsule, TAKE ONE CAPSULE BY MOUTH ONCE A DAY, Disp: 30 capsule, Rfl: 12 .  pravastatin (PRAVACHOL) 40 MG tablet, TAKE 1 TABLET BY MOUTH AT BEDTIME, Disp: 30 tablet, Rfl: 12  Review of Systems  Constitutional: Negative for appetite change, chills, fatigue and fever.  Respiratory: Negative for chest tightness and shortness of breath.   Cardiovascular: Negative for chest pain and palpitations.  Gastrointestinal: Negative for abdominal pain, nausea and vomiting.  Neurological: Negative for dizziness and weakness.    Social History   Tobacco Use  . Smoking status: Former Smoker    Packs/day: 1.00    Years: 24.00    Pack years: 24.00    Types: Cigarettes    Start date: 1990    Last attempt to quit: 11/08/2015    Years since quitting: 2.0  . Smokeless tobacco: Never Used  Substance Use Topics  . Alcohol use: No   Objective:   BP 110/70 (BP Location: Left Arm, Patient Position: Sitting, Cuff Size: Normal)  Pulse 67   Temp 98.1 F (36.7 C) (Oral)   Resp 16   Ht 5\' 5"  (1.651 m)   Wt 219 lb (99.3 kg)   SpO2 95%   BMI 36.44 kg/m  Vitals:   11/28/17 1048  BP: 110/70  Pulse: 67  Resp: 16  Temp: 98.1 F (36.7 C)  TempSrc: Oral  SpO2: 95%  Weight: 219 lb (99.3 kg)  Height: 5\' 5"  (1.651 m)     Physical Exam   General Appearance:    Alert, cooperative, no distress  Eyes:    PERRL, conjunctiva/corneas clear, EOM's intact       Lungs:     Clear to auscultation bilaterally, respirations unlabored  Heart:    Regular rate and rhythm  Neurologic:   Awake, alert, oriented x 3. No apparent focal neurological           defect.           Assessment & Plan:     1. Chronic  low back pain with right-sided sciatica, unspecified back pain laterality Stable, pain relatively well controlled on current medications.   2. BMI 34.0-34.9,adult Counseled regarding prudent diet and regular exercise.   3. . Pure hypercholesterolemia She is tolerating pravastatin well with no adverse effects.   - Lipid panel - Comprehensive metabolic panel  5. Back pain, chronic Stable, Continue current medications.   - gabapentin (NEURONTIN) 300 MG capsule; TAKE ONE CAPSULE BY MOUTH 3 TIMES A DAY AND TAKE 2 CAPSULES BY MOUTH AT BEDTIME  Dispense: 150 capsule; Refill: 11  6. Chronic back pain, unspecified back location, unspecified back pain laterality  - HYDROcodone-acetaminophen (NORCO/VICODIN) 5-325 MG tablet; Take 1 tablet by mouth every 6 (six) hours as needed for moderate pain.  Dispense: 60 tablet; Refill: 0  7. Chronic low back pain, unspecified back pain laterality, with sciatica presence unspecified  - morphine (MS CONTIN) 15 MG 12 hr tablet; Take 1 tablet (15 mg total) by mouth every 8 (eight) hours.  Dispense: 90 tablet; Refill: 0       Lelon Huh, MD  [facility]

## 2017-11-28 NOTE — Patient Instructions (Addendum)
   Please call the [facility] [phone]) to schedule a routine screening mammogram.   The CDC recommends two doses of Shingrix (the shingles vaccine) separated by 2 to 6 months for adults age 64 years and older. I recommend checking with your pharmacy plan regarding coverage for this vaccine.

## 2017-11-29 LAB — COMPREHENSIVE METABOLIC PANEL
ALT: 15 IU/L (ref 0–32)
AST: 12 IU/L (ref 0–40)
Albumin/Globulin Ratio: 1.6 (ref 1.2–2.2)
Albumin: 3.9 g/dL (ref 3.6–4.8)
Alkaline Phosphatase: 60 IU/L (ref 39–117)
BUN/Creatinine Ratio: 17 (ref 12–28)
BUN: 12 mg/dL (ref 8–27)
Bilirubin Total: <0.2 mg/dL (ref 0.0–1.2)
CO2: 30 mmol/L — ABNORMAL HIGH (ref 20–29)
Calcium: 9.4 mg/dL (ref 8.7–10.3)
Chloride: 103 mmol/L (ref 96–106)
Creatinine, Ser: 0.71 mg/dL (ref 0.57–1.00)
GFR calc Af Amer: 105 mL/min/{1.73_m2} (ref >59)
GFR calc non Af Amer: 91 mL/min/{1.73_m2} (ref >59)
Globulin, Total: 2.5 g/dL (ref 1.5–4.5)
Glucose: 88 mg/dL (ref 65–99)
Potassium: 4.7 mmol/L (ref 3.5–5.2)
Sodium: 142 mmol/L (ref 134–144)
Total Protein: 6.4 g/dL (ref 6.0–8.5)

## 2017-11-29 LAB — LIPID PANEL
Chol/HDL Ratio: 4.5 ratio — ABNORMAL HIGH (ref 0.0–4.4)
Cholesterol, Total: 186 mg/dL (ref 100–199)
HDL: 41 mg/dL (ref >39)
LDL Calculated: 91 mg/dL (ref 0–99)
Triglycerides: 272 mg/dL — ABNORMAL HIGH (ref 0–149)
VLDL Cholesterol Cal: 54 mg/dL — ABNORMAL HIGH (ref 5–40)

## 2017-12-22 ENCOUNTER — Other Ambulatory Visit: Payer: Self-pay | Admitting: Family Medicine

## 2017-12-22 DIAGNOSIS — K219 Gastro-esophageal reflux disease without esophagitis: Secondary | ICD-10-CM

## 2017-12-22 MED ORDER — ESOMEPRAZOLE MAGNESIUM 40 MG PO CPDR: DELAYED_RELEASE_CAPSULE | ORAL | 4 refills | Status: DC

## 2017-12-22 NOTE — Telephone Encounter (Signed)
[facility] faxed a refill request for the following medication. Thanks CC  NEXIUM 40 MG capsule

## 2017-12-29 ENCOUNTER — Other Ambulatory Visit: Payer: Self-pay | Admitting: Family Medicine

## 2017-12-29 DIAGNOSIS — M545 Low back pain: Principal | ICD-10-CM

## 2017-12-29 DIAGNOSIS — M549 Dorsalgia, unspecified: Secondary | ICD-10-CM

## 2017-12-29 DIAGNOSIS — G8929 Other chronic pain: Secondary | ICD-10-CM

## 2017-12-29 MED ORDER — HYDROCODONE-ACETAMINOPHEN 5-325 MG PO TABS: 1.0000 | ORAL_TABLET | Freq: Four times a day (QID) | ORAL | 0 refills | Status: DC | PRN

## 2017-12-29 MED ORDER — MORPHINE SULFATE ER 15 MG PO TBCR: 15.0000 mg | EXTENDED_RELEASE_TABLET | Freq: Three times a day (TID) | ORAL | 0 refills | Status: DC

## 2017-12-29 NOTE — Telephone Encounter (Signed)
Patient is OUT of medications.    She needs Morphine 15 mg. And Hydrocodone 5-325 mg. [facility] [place] [facility].

## 2018-01-23 ENCOUNTER — Other Ambulatory Visit: Payer: Self-pay | Admitting: Family Medicine

## 2018-01-23 DIAGNOSIS — Z1231 Encounter for screening mammogram for malignant neoplasm of breast: Secondary | ICD-10-CM

## 2018-01-26 ENCOUNTER — Other Ambulatory Visit: Payer: Self-pay | Admitting: Family Medicine

## 2018-01-26 DIAGNOSIS — M549 Dorsalgia, unspecified: Secondary | ICD-10-CM

## 2018-01-26 DIAGNOSIS — M545 Low back pain: Principal | ICD-10-CM

## 2018-01-26 DIAGNOSIS — G8929 Other chronic pain: Secondary | ICD-10-CM

## 2018-01-26 MED ORDER — HYDROCODONE-ACETAMINOPHEN 5-325 MG PO TABS: 1.0000 | ORAL_TABLET | Freq: Four times a day (QID) | ORAL | 0 refills | Status: DC | PRN

## 2018-01-26 MED ORDER — MORPHINE SULFATE ER 15 MG PO TBCR: 15.0000 mg | EXTENDED_RELEASE_TABLET | Freq: Three times a day (TID) | ORAL | 0 refills | Status: DC

## 2018-01-26 NOTE — Telephone Encounter (Signed)
Pt contacted office for refill request on the following medications:  1. morphine (MS CONTIN) 15 MG 12 hr tablet   2. HYDROcodone-acetaminophen (NORCO/VICODIN) 5-325 MG tablet   [facility] [address]  Last Rx: 12/29/17 LOV: 11/28/17 Pt stated that she will run out of the medication today and needs the Rx sent in today. Pt was advised refill request can take 24 to 48 hours. Please advise. Thanks TNP

## 2018-02-09 ENCOUNTER — Other Ambulatory Visit: Payer: Self-pay | Admitting: Family Medicine

## 2018-02-09 DIAGNOSIS — E785 Hyperlipidemia, unspecified: Secondary | ICD-10-CM

## 2018-02-20 ENCOUNTER — Ambulatory Visit
Admission: RE | Admit: 2018-02-20 | Discharge: 2018-02-20 | Disposition: A | Discharge status: Home / Self Care | Payer: Medicare Other | Source: Ambulatory Visit | Attending: Family Medicine | Admitting: Family Medicine

## 2018-02-20 DIAGNOSIS — Z1231 Encounter for screening mammogram for malignant neoplasm of breast: Secondary | ICD-10-CM | POA: Insufficient documentation

## 2018-02-24 ENCOUNTER — Other Ambulatory Visit: Payer: Self-pay | Admitting: Family Medicine

## 2018-02-24 DIAGNOSIS — M549 Dorsalgia, unspecified: Principal | ICD-10-CM

## 2018-02-24 DIAGNOSIS — G8929 Other chronic pain: Secondary | ICD-10-CM

## 2018-02-24 DIAGNOSIS — M545 Low back pain: Secondary | ICD-10-CM

## 2018-02-24 MED ORDER — MORPHINE SULFATE ER 15 MG PO TBCR: 15.0000 mg | EXTENDED_RELEASE_TABLET | Freq: Three times a day (TID) | ORAL | 0 refills | Status: DC

## 2018-02-24 MED ORDER — HYDROCODONE-ACETAMINOPHEN 5-325 MG PO TABS: 1.0000 | ORAL_TABLET | Freq: Four times a day (QID) | ORAL | 0 refills | Status: DC | PRN

## 2018-02-24 NOTE — Telephone Encounter (Signed)
Patient needs refill on Morphine 15 mg.and Hydrocodone Acet 5-325 mg. [facility] [place]

## 2018-02-25 ENCOUNTER — Other Ambulatory Visit: Payer: Self-pay | Admitting: General Surgery

## 2018-02-26 ENCOUNTER — Telehealth: Payer: Self-pay

## 2018-02-26 ENCOUNTER — Other Ambulatory Visit: Payer: Self-pay | Admitting: Family Medicine

## 2018-02-26 NOTE — Telephone Encounter (Signed)
Called pt to schedule AWV and CPE. Pt is due for both 03/2018. Pt has a f/u apt scheduled for 05/28/18. LMTCB to see about changing that to a CPE and adding an AWV prior to that.  -MM

## 2018-02-26 NOTE — Telephone Encounter (Signed)
Patient advised that the prescription was sent on 02/24/2018. I also confirmed with pharmacy that they received prescription.

## 2018-02-26 NOTE — Telephone Encounter (Signed)
Pt called saying she recd notifcation that her hydrocodone was at the pharmacy but the morphine was not.  She needs a refill on both prescriptions.  [facility] [address] [place]  Her call back is [phone]  Thanks Con Memos

## 2018-03-11 ENCOUNTER — Other Ambulatory Visit: Payer: Self-pay | Admitting: Family Medicine

## 2018-03-11 DIAGNOSIS — K219 Gastro-esophageal reflux disease without esophagitis: Secondary | ICD-10-CM

## 2018-03-25 NOTE — Telephone Encounter (Signed)
R/S for 04/30/18. -MM

## 2018-03-28 ENCOUNTER — Telehealth: Payer: Self-pay | Admitting: Family Medicine

## 2018-03-28 ENCOUNTER — Other Ambulatory Visit: Payer: Self-pay | Admitting: Family Medicine

## 2018-03-28 DIAGNOSIS — G8929 Other chronic pain: Secondary | ICD-10-CM

## 2018-03-28 DIAGNOSIS — M549 Dorsalgia, unspecified: Principal | ICD-10-CM

## 2018-03-28 DIAGNOSIS — M545 Low back pain: Secondary | ICD-10-CM

## 2018-03-28 MED ORDER — MORPHINE SULFATE ER 15 MG PO TBCR: 15.0000 mg | EXTENDED_RELEASE_TABLET | Freq: Three times a day (TID) | ORAL | 0 refills | Status: DC

## 2018-03-28 MED ORDER — HYDROCODONE-ACETAMINOPHEN 5-325 MG PO TABS: 1.0000 | ORAL_TABLET | Freq: Four times a day (QID) | ORAL | 0 refills | Status: DC | PRN

## 2018-03-28 NOTE — Telephone Encounter (Signed)
Pt contacted office for refill request on the following medications:  1. morphine (MS CONTIN) 15 MG 12 hr tablet  2. HYDROcodone-acetaminophen (NORCO/VICODIN) 5-325 MG tablet  [facility] [address]  Pt stated that she is concerned she is going to run out of the medication before Dr. Caryn Section return to the office on Monday. Please advise. Thanks TNP

## 2018-03-28 NOTE — Progress Notes (Signed)
Nearly out of the [facility] and MS-Contin. Given #10 of each to carry her through the next 2 days.

## 2018-03-28 NOTE — Telephone Encounter (Signed)
Please advise, per patient she will be out of medication by end of weekend. KW

## 2018-03-28 NOTE — Telephone Encounter (Signed)
Sent a 2 day supply to the [facility].

## 2018-03-30 ENCOUNTER — Telehealth: Payer: Self-pay

## 2018-03-30 NOTE — Telephone Encounter (Signed)
Rx request has already been sent to provider. 

## 2018-03-30 NOTE — Telephone Encounter (Signed)
Patient called requesting a refill on Hydrocodone and Morphine. [facility] [address]. Thanks!

## 2018-03-31 ENCOUNTER — Telehealth: Payer: Self-pay

## 2018-03-31 DIAGNOSIS — M549 Dorsalgia, unspecified: Secondary | ICD-10-CM

## 2018-03-31 DIAGNOSIS — M545 Low back pain: Principal | ICD-10-CM

## 2018-03-31 DIAGNOSIS — G8929 Other chronic pain: Secondary | ICD-10-CM

## 2018-03-31 MED ORDER — MORPHINE SULFATE ER 15 MG PO TBCR: 15.0000 mg | EXTENDED_RELEASE_TABLET | Freq: Three times a day (TID) | ORAL | 0 refills | Status: DC

## 2018-03-31 MED ORDER — HYDROCODONE-ACETAMINOPHEN 5-325 MG PO TABS: 1.0000 | ORAL_TABLET | Freq: Four times a day (QID) | ORAL | 0 refills | Status: DC | PRN

## 2018-03-31 NOTE — Telephone Encounter (Signed)
Patient called back requesting Dr. Caryn Section to ref[facility] her hydrocodone and [facility] [place]. Patient reports that Simona Huh only gave her 10 tablets. Please advise. Thanks!

## 2018-04-09 ENCOUNTER — Ambulatory Visit: Payer: Self-pay | Admitting: Family Medicine

## 2018-04-14 ENCOUNTER — Telehealth: Payer: Self-pay | Admitting: Emergency Medicine

## 2018-04-14 MED ORDER — FLUCONAZOLE 150 MG PO TABS: 150.0000 mg | ORAL_TABLET | Freq: Once | ORAL | 0 refills | Status: AC

## 2018-04-14 NOTE — Telephone Encounter (Signed)
Pt called stating that her dentist put her on an antibiotic today and when she is on antibiotics she gets a yeast infection. She wants to know if Dr. Caryn Section can call her in a Diflucan for this. Please advise. Thanks.   [facility] [place] [address]

## 2018-04-14 NOTE — Telephone Encounter (Signed)
Please advise 

## 2018-04-17 ENCOUNTER — Encounter: Payer: Self-pay | Admitting: Family Medicine

## 2018-04-17 ENCOUNTER — Ambulatory Visit (INDEPENDENT_AMBULATORY_CARE_PROVIDER_SITE_OTHER): Payer: Medicare Other | Admitting: Family Medicine

## 2018-04-17 VITALS — BP 100/68 | HR 64 | Temp 97.9°F | Resp 16 | Wt 211.0 lb

## 2018-04-17 DIAGNOSIS — B029 Zoster without complications: Secondary | ICD-10-CM | POA: Diagnosis not present

## 2018-04-17 MED ORDER — VALACYCLOVIR HCL 1 G PO TABS: 1000.0000 mg | ORAL_TABLET | Freq: Three times a day (TID) | ORAL | 0 refills | Status: DC

## 2018-04-17 NOTE — Patient Instructions (Addendum)
   The CDC recommends two doses of Shingrix (the shingles vaccine) separated by 2 to 6 months for adults age 64 years and older. I recommend checking with your insurance plan regarding coverage for this vaccine.   

## 2018-04-17 NOTE — Progress Notes (Signed)
Patient: Alejandra Little Female    DOB: 14-Feb-1956   62 y.o.   MRN: [id number] Visit Date: 04/17/2018  Today's Provider: Lelon Huh, MD   Chief Complaint  Patient presents with  . Rash   Subjective:    Rash  This is a new problem. The current episode started yesterday. The problem is unchanged. The affected locations include the head. The rash is characterized by scaling. She was exposed to nothing. Associated symptoms include fatigue. Pertinent negatives include no anorexia, congestion, cough, diarrhea, eye pain, facial edema, fever, joint pain, nail changes, rhinorrhea, shortness of breath, sore throat or vomiting. Past treatments include nothing.    Patient states she has not felt good all week. She has been tried and had a headache. Patient states that yesterday her head and scalp felt sore. When she rubbed her hand through her hair she could feel bumps all over scalp. Bumps are sore and feel scaly. Patient has not taken any medications for rash. She reports history of shingles a few years ago which felt exactly the same and on same side of scalp.     No Known Allergies   Current Outpatient Medications:  .  ALPRAZolam (XANAX) 0.5 MG tablet, TAKE 1 TABLET BY MOUTH AT BEDTIME, Disp: 30 tablet, Rfl: 5 .  amoxicillin-clavulanate (AUGMENTIN) 500-125 MG tablet, Take 1 tablet by mouth 3 (three) times daily., Disp: , Rfl: 0 .  FLUoxetine (PROZAC) 20 MG capsule, TAKE 3 CAPSULES BY MOUTH EVERY DAY, Disp: 90 capsule, Rfl: 11 .  gabapentin (NEURONTIN) 300 MG capsule, TAKE ONE CAPSULE BY MOUTH 3 TIMES A DAY AND TAKE 2 CAPSULES BY MOUTH AT BEDTIME, Disp: 150 capsule, Rfl: 11 .  HYDROcodone-acetaminophen (NORCO/VICODIN) 5-325 MG tablet, Take 1 tablet by mouth every 6 (six) hours as needed for moderate pain., Disp: 60 tablet, Rfl: 0 .  morphine (MS CONTIN) 15 MG 12 hr tablet, Take 1 tablet (15 mg total) by mouth every 8 (eight) hours., Disp: 90 tablet, Rfl: 0 .  naproxen (NAPROSYN) 500 MG  tablet, TAKE 1 TABLET (500 MG TOTAL) BY MOUTH 2 (TWO) TIMES DAILY WITH A MEAL., Disp: 30 tablet, Rfl: 5 .  NEXIUM 40 MG capsule, TAKE ONE CAPSULE BY MOUTH ONCE A DAY, Disp: 30 capsule, Rfl: 12 .  pravastatin (PRAVACHOL) 40 MG tablet, TAKE 1 TABLET BY MOUTH AT BEDTIME, Disp: 30 tablet, Rfl: 12  Review of Systems  Constitutional: Positive for fatigue. Negative for fever.  HENT: Negative for congestion, rhinorrhea and sore throat.   Eyes: Negative for pain.  Respiratory: Negative for cough and shortness of breath.   Gastrointestinal: Negative for anorexia, diarrhea and vomiting.  Musculoskeletal: Negative for joint pain.  Skin: Positive for rash. Negative for nail changes.    Social History   Tobacco Use  . Smoking status: Former Smoker    Packs/day: 1.00    Years: 24.00    Pack years: 24.00    Types: Cigarettes    Start date: 1990    Last attempt to quit: 11/08/2015    Years since quitting: 2.4  . Smokeless tobacco: Never Used  Substance Use Topics  . Alcohol use: No   Objective:   BP 100/68 (BP Location: Right Arm, Patient Position: Sitting, Cuff Size: Large)   Pulse 64   Temp 97.9 F (36.6 C) (Oral)   Resp 16   Wt 211 lb (95.7 kg)   SpO2 96%   BMI 35.11 kg/m  Vitals:   04/17/18 1117  BP: 100/68  Pulse: 64  Resp: 16  Temp: 97.9 F (36.6 C)  TempSrc: Oral  SpO2: 96%  Weight: 211 lb (95.7 kg)     Physical Exam  General appearance: alert, well developed, well nourished, cooperative and in no distress Head: Normocephalic, without obvious abnormality, atraumatic Respiratory: Respirations even and unlabored, normal respiratory rate Skin: several scattered vesicular lesion across top of scalp all right of midline c/w zoster.       Assessment & Plan:     1. Herpes zoster without complication  - valACYclovir (VALTREX) 1000 MG tablet; Take 1 tablet (1,000 mg total) by mouth every 8 (eight) hours for 7 days.  Dispense: 21 tablet; Refill: 0  Recommend she discuss  availability of Shingrix with her pharmacist.       Lelon Huh, MD  [facility]

## 2018-04-24 ENCOUNTER — Ambulatory Visit (INDEPENDENT_AMBULATORY_CARE_PROVIDER_SITE_OTHER): Payer: Medicare Other | Admitting: Family Medicine

## 2018-04-24 ENCOUNTER — Encounter: Payer: Self-pay | Admitting: Family Medicine

## 2018-04-24 VITALS — BP 100/62 | HR 75 | Temp 98.1°F | Resp 16 | Wt 208.0 lb

## 2018-04-24 DIAGNOSIS — F419 Anxiety disorder, unspecified: Secondary | ICD-10-CM | POA: Diagnosis not present

## 2018-04-24 DIAGNOSIS — M545 Low back pain: Secondary | ICD-10-CM

## 2018-04-24 DIAGNOSIS — G8929 Other chronic pain: Secondary | ICD-10-CM | POA: Diagnosis not present

## 2018-04-24 DIAGNOSIS — B029 Zoster without complications: Secondary | ICD-10-CM | POA: Diagnosis not present

## 2018-04-24 DIAGNOSIS — R21 Rash and other nonspecific skin eruption: Secondary | ICD-10-CM | POA: Diagnosis not present

## 2018-04-24 MED ORDER — HYDROCODONE-ACETAMINOPHEN 7.5-325 MG PO TABS: 1.0000 | ORAL_TABLET | Freq: Four times a day (QID) | ORAL | 0 refills | Status: AC | PRN

## 2018-04-24 NOTE — Patient Instructions (Signed)
You can OTC Benadryl every four hours for itching

## 2018-04-24 NOTE — Progress Notes (Signed)
Patient: Alejandra Little Female    DOB: 1956-06-08   62 y.o.   MRN: [id number] Visit Date: 04/24/2018  Today's Provider: Lelon Huh, MD   Chief Complaint  Patient presents with  . Follow-up  . Back Pain  . Anxiety   Subjective:    HPI  Chronic low back pain with right-sided sciatica, unspecified back pain laterality From 11/28/2017-no changes. Stable, pain relatively well controlled on current medications. However, she states she really wants to get off of morphine tablets. Has been on same dose for several years and remains effective, and states she had terrible withdrawal symptoms when she was taken off medication in the past. Takes hydrocodone/apap every night and often once or twice during the day.   Anxiety From 05/28/2017-no changes were made. She states she is doing well with 60mg  fluoxetine a day which she wishes to continue.   Herpes zoster without complication From 4/65/0354 with lesions on scalp which were all to the left of midline. Prescribed  valACYclovir (VALTREX) 1000 MG tablet which she finishes today and reports scalp lesions have resolved, however over the last day she has developed redness and itching across her forehead and neck, and around her navel. Has been using OTC hydrocortisone which she states helps. She has also been on augmenting for the several weeks for dental infections.      No Known Allergies   Current Outpatient Medications:  .  ALPRAZolam (XANAX) 0.5 MG tablet, TAKE 1 TABLET BY MOUTH AT BEDTIME, Disp: 30 tablet, Rfl: 5 .  amoxicillin-clavulanate (AUGMENTIN) 500-125 MG tablet, Take 1 tablet by mouth 3 (three) times daily., Disp: , Rfl: 0 .  FLUoxetine (PROZAC) 20 MG capsule, TAKE 3 CAPSULES BY MOUTH EVERY DAY, Disp: 90 capsule, Rfl: 11 .  gabapentin (NEURONTIN) 300 MG capsule, TAKE ONE CAPSULE BY MOUTH 3 TIMES A DAY AND TAKE 2 CAPSULES BY MOUTH AT BEDTIME, Disp: 150 capsule, Rfl: 11 .  HYDROcodone-acetaminophen (NORCO/VICODIN) 5-325 MG  tablet, Take 1 tablet by mouth every 6 (six) hours as needed for moderate pain., Disp: 60 tablet, Rfl: 0 .  morphine (MS CONTIN) 15 MG 12 hr tablet, Take 1 tablet (15 mg total) by mouth every 8 (eight) hours., Disp: 90 tablet, Rfl: 0 .  naproxen (NAPROSYN) 500 MG tablet, TAKE 1 TABLET (500 MG TOTAL) BY MOUTH 2 (TWO) TIMES DAILY WITH A MEAL., Disp: 30 tablet, Rfl: 5 .  NEXIUM 40 MG capsule, TAKE ONE CAPSULE BY MOUTH ONCE A DAY, Disp: 30 capsule, Rfl: 12 .  pravastatin (PRAVACHOL) 40 MG tablet, TAKE 1 TABLET BY MOUTH AT BEDTIME, Disp: 30 tablet, Rfl: 12 .  valACYclovir (VALTREX) 1000 MG tablet, Take 1 tablet (1,000 mg total) by mouth every 8 (eight) hours for 7 days., Disp: 21 tablet, Rfl: 0  Review of Systems  Constitutional: Negative for appetite change, chills, fatigue and fever.  Respiratory: Negative for chest tightness and shortness of breath.   Cardiovascular: Negative for chest pain and palpitations.  Gastrointestinal: Negative for abdominal pain, nausea and vomiting.  Skin: Positive for rash.  Neurological: Negative for dizziness and weakness.    Social History   Tobacco Use  . Smoking status: Former Smoker    Packs/day: 1.00    Years: 24.00    Pack years: 24.00    Types: Cigarettes    Start date: 1990    Last attempt to quit: 11/08/2015    Years since quitting: 2.4  . Smokeless tobacco: Never Used  Substance  Use Topics  . Alcohol use: No   Objective:   BP 100/62 (BP Location: Right Arm, Patient Position: Sitting, Cuff Size: Large)   Pulse 75   Temp 98.1 F (36.7 C) (Oral)   Resp 16   Wt 208 lb (94.3 kg)   BMI 34.61 kg/m  Vitals:   04/24/18 1045  BP: 100/62  Pulse: 75  Resp: 16  Temp: 98.1 F (36.7 C)  TempSrc: Oral  Weight: 208 lb (94.3 kg)     Physical Exam  General appearance: alert, well developed, well nourished, cooperative and in no distress Head: Normocephalic, without obvious abnormality, atraumatic Respiratory: Respirations even and unlabored,  normal respiratory rate Extremities: No gross deformities Skin: Vague patches or erythema across forehead and neck with a few excoriations. No other lesions. Scalp lesions have resolved.     Office Visit from 04/24/2018 in [place]  PHQ-2 Total Score  4         Assessment & Plan:     1. Chronic low back pain, unspecified back pain laterality, with sciatica presence unspecified Pain fairly well controlled, but she is insistent that she wants to stop taking morphine but doesn't want withdrawal symptoms. Advised she may need to take hydrocodone/apap on a more regular schedule initially to reduce withdrawal sx, will change hydrocodone/apap from 5-325 to - HYDROcodone-acetaminophen (NORCO) 7.5-325 MG tablet; Take 1 tablet by mouth 4 (four) times daily as needed for up to 5 days for moderate pain.  Dispense: 100 tablet; Refill: 0  And discontinue Morphine ER  2. Herpes zoster without complication Resolved, finished with valacyclovir today.   3. Rash Not c/w Zoster, likely allergic reaction. May continue OTC topical hydrocortisone which she reports is effective. Can also take OTC benadryl prn itching. Call if not steadily improving after finishing valacyclovir and Augmentin today.   4. Anxiety Doing well on current dose of fluoxetine.        Lelon Huh, MD  [facility]

## 2018-04-30 ENCOUNTER — Ambulatory Visit: Payer: Self-pay | Admitting: Family Medicine

## 2018-04-30 ENCOUNTER — Encounter: Payer: Self-pay | Admitting: Family Medicine

## 2018-04-30 ENCOUNTER — Ambulatory Visit (INDEPENDENT_AMBULATORY_CARE_PROVIDER_SITE_OTHER): Payer: Medicare Other

## 2018-04-30 VITALS — BP 120/78 | HR 64 | Temp 99.1°F | Ht 65.0 in | Wt 212.0 lb

## 2018-04-30 DIAGNOSIS — Z0000 Encounter for general adult medical examination without abnormal findings: Secondary | ICD-10-CM

## 2018-04-30 NOTE — Patient Instructions (Addendum)
Alejandra Little , Thank you for taking time to come for your Medicare Wellness Visit. I appreciate your ongoing commitment to your health goals. Please review the following plan we discussed and let me know if I can assist you in the future.   Screening recommendations/referrals: Colonoscopy: Up to date Mammogram: Up to date Bone Density: Up to date Recommended yearly ophthalmology/optometry visit for glaucoma screening and checkup Recommended yearly dental visit for hygiene and checkup  Vaccinations: Influenza vaccine: Up to date Pneumococcal vaccine: N/A Tdap vaccine: Up to date Shingles vaccine: Pt declines today.     Advanced directives: Advance directive discussed with you today. Even though you declined this today please call our office should you change your mind and we can give you the proper paperwork for you to fill out.  Conditions/risks identified: Obesity- recommend to cut out all junk food when snacking and replace with fruit and vegetables.   Next appointment: 07/13/18 @ 10 AM with Dr Caryn Section.  Preventive Care 40-64 Years, Female Preventive care refers to lifestyle choices and visits with your health care provider that can promote health and wellness. What does preventive care include?  A yearly physical exam. This is also called an annual well check.  Dental exams once or twice a year.  Routine eye exams. Ask your health care provider how often you should have your eyes checked.  Personal lifestyle choices, including:  Daily care of your teeth and gums.  Regular physical activity.  Eating a healthy diet.  Avoiding tobacco and drug use.  Limiting alcohol use.  Practicing safe sex.  Taking low-dose aspirin daily starting at age 44.  Taking vitamin and mineral supplements as recommended by your health care provider. What happens during an annual well check? The services and screenings done by your health care provider during your annual well check will depend  on your age, overall health, lifestyle risk factors, and family history of disease. Counseling  Your health care provider may ask you questions about your:  Alcohol use.  Tobacco use.  Drug use.  Emotional well-being.  Home and relationship well-being.  Sexual activity.  Eating habits.  Work and work Statistician.  Method of birth control.  Menstrual cycle.  Pregnancy history. Screening  You may have the following tests or measurements:  Height, weight, and BMI.  Blood pressure.  Lipid and cholesterol levels. These may be checked every 5 years, or more frequently if you are over 66 years old.  Skin check.  Lung cancer screening. You may have this screening every year starting at age 34 if you have a 30-pack-year history of smoking and currently smoke or have quit within the past 15 years.  Fecal occult blood test (FOBT) of the stool. You may have this test every year starting at age 5.  Flexible sigmoidoscopy or colonoscopy. You may have a sigmoidoscopy every 5 years or a colonoscopy every 10 years starting at age 55.  Hepatitis C blood test.  Hepatitis B blood test.  Sexually transmitted disease (STD) testing.  Diabetes screening. This is done by checking your blood sugar (glucose) after you have not eaten for a while (fasting). You may have this done every 1-3 years.  Mammogram. This may be done every 1-2 years. Talk to your health care provider about when you should start having regular mammograms. This may depend on whether you have a family history of breast cancer.  BRCA-related cancer screening. This may be done if you have a family history of breast, ovarian,  tubal, or peritoneal cancers.  Pelvic exam and Pap test. This may be done every 3 years starting at age 18. Starting at age 47, this may be done every 5 years if you have a Pap test in combination with an HPV test.  Bone density scan. This is done to screen for osteoporosis. You may have this scan  if you are at high risk for osteoporosis. Discuss your test results, treatment options, and if necessary, the need for more tests with your health care provider. Vaccines  Your health care provider may recommend certain vaccines, such as:  Influenza vaccine. This is recommended every year.  Tetanus, diphtheria, and acellular pertussis (Tdap, Td) vaccine. You may need a Td booster every 10 years.  Zoster vaccine. You may need this after age 50.  Pneumococcal 13-valent conjugate (PCV13) vaccine. You may need this if you have certain conditions and were not previously vaccinated.  Pneumococcal polysaccharide (PPSV23) vaccine. You may need one or two doses if you smoke cigarettes or if you have certain conditions. Talk to your health care provider about which screenings and vaccines you need and how often you need them. This information is not intended to replace advice given to you by your health care provider. Make sure you discuss any questions you have with your health care provider. Document Released: 12/01/2015 Document Revised: 07/24/2016 Document Reviewed: 09/05/2015 Elsevier Interactive Patient Education  2017 [place] Prevention in the Home Falls can cause injuries. They can happen to people of all ages. There are many things you can do to make your home safe and to help prevent falls. What can I do on the outside of my home?  Regularly fix the edges of walkways and driveways and fix any cracks.  Remove anything that might make you trip as you walk through a door, such as a raised step or threshold.  Trim any bushes or trees on the path to your home.  Use bright outdoor lighting.  Clear any walking paths of anything that might make someone trip, such as rocks or tools.  Regularly check to see if handrails are loose or broken. Make sure that both sides of any steps have handrails.  Any raised decks and porches should have guardrails on the edges.  Have any  leaves, snow, or ice cleared regularly.  Use sand or salt on walking paths during winter.  Clean up any spills in your garage right away. This includes oil or grease spills. What can I do in the bathroom?  Use night lights.  Install grab bars by the toilet and in the tub and shower. Do not use towel bars as grab bars.  Use non-skid mats or decals in the tub or shower.  If you need to sit down in the shower, use a plastic, non-slip stool.  Keep the floor dry. Clean up any water that spills on the floor as soon as it happens.  Remove soap buildup in the tub or shower regularly.  Attach bath mats securely with double-sided non-slip rug tape.  Do not have throw rugs and other things on the floor that can make you trip. What can I do in the bedroom?  Use night lights.  Make sure that you have a light by your bed that is easy to reach.  Do not use any sheets or blankets that are too big for your bed. They should not hang down onto the floor.  Have a firm chair that has side arms.  You can use this for support while you get dressed.  Do not have throw rugs and other things on the floor that can make you trip. What can I do in the kitchen?  Clean up any spills right away.  Avoid walking on wet floors.  Keep items that you use a lot in easy-to-reach places.  If you need to reach something above you, use a strong step stool that has a grab bar.  Keep electrical cords out of the way.  Do not use floor polish or wax that makes floors slippery. If you must use wax, use non-skid floor wax.  Do not have throw rugs and other things on the floor that can make you trip. What can I do with my stairs?  Do not leave any items on the stairs.  Make sure that there are handrails on both sides of the stairs and use them. Fix handrails that are broken or loose. Make sure that handrails are as long as the stairways.  Check any carpeting to make sure that it is firmly attached to the stairs.  Fix any carpet that is loose or worn.  Avoid having throw rugs at the top or bottom of the stairs. If you do have throw rugs, attach them to the floor with carpet tape.  Make sure that you have a light switch at the top of the stairs and the bottom of the stairs. If you do not have them, ask someone to add them for you. What else can I do to help prevent falls?  Wear shoes that:  Do not have high heels.  Have rubber bottoms.  Are comfortable and fit you well.  Are closed at the toe. Do not wear sandals.  If you use a stepladder:  Make sure that it is fully opened. Do not climb a closed stepladder.  Make sure that both sides of the stepladder are locked into place.  Ask someone to hold it for you, if possible.  Clearly mark and make sure that you can see:  Any grab bars or handrails.  First and last steps.  Where the edge of each step is.  Use tools that help you move around (mobility aids) if they are needed. These include:  Canes.  Walkers.  Scooters.  Crutches.  Turn on the lights when you go into a dark area. Replace any light bulbs as soon as they burn out.  Set up your furniture so you have a clear path. Avoid moving your furniture around.  If any of your floors are uneven, fix them.  If there are any pets around you, be aware of where they are.  Review your medicines with your doctor. Some medicines can make you feel dizzy. This can increase your chance of falling. Ask your doctor what other things that you can do to help prevent falls. This information is not intended to replace advice given to you by your health care provider. Make sure you discuss any questions you have with your health care provider. Document Released: 08/31/2009 Document Revised: 04/11/2016 Document Reviewed: 12/09/2014 Elsevier Interactive Patient Education  2017 [employer].

## 2018-04-30 NOTE — Progress Notes (Signed)
Subjective:   Alejandra Little is a 62 y.o. female who presents for Medicare Annual (Subsequent) preventive examination.  Review of Systems:  N/A  Cardiac Risk Factors include: advanced age (>22men, >14 women);dyslipidemia;obesity (BMI >30kg/m2)     Objective:     Vitals: BP 120/78 (BP Location: Right Arm)   Pulse 64   Temp 99.1 F (37.3 C) (Oral)   Ht 5\' 5"  (1.651 m)   Wt 212 lb (96.2 kg)   BMI 35.28 kg/m   Body mass index is 35.28 kg/m.  Advanced Directives 04/30/2018 03/18/2017 02/28/2016 12/01/2015 06/07/2015  Does Patient Have a Medical Advance Directive? No No No No No  Would patient like information on creating a medical advance directive? - No - Patient declined - - No - patient declined information    Tobacco Social History   Tobacco Use  Smoking Status Former Smoker  . Packs/day: 1.00  . Years: 24.00  . Pack years: 24.00  . Types: Cigarettes  . Start date: 29  . Last attempt to quit: 11/08/2015  . Years since quitting: 2.4  Smokeless Tobacco Never Used     Counseling given: Not Answered   Clinical Intake:  Pre-visit preparation completed: Yes  Pain : 0-10 Pain Score: 6  Pain Type: Chronic pain Pain Location: Back Pain Orientation: Lower Pain Descriptors / Indicators: Sore Pain Frequency: Constant     Nutritional Status: BMI > 30  Obese Nutritional Risks: None Diabetes: No  How often do you need to have someone help you when you read instructions, pamphlets, or other written materials from your doctor or pharmacy?: 1 - Never  Interpreter Needed?: No  Information entered by :: [facility], LPN  Past Medical History:  Diagnosis Date  . Anxiety   . Dog tapeworm infection 01/11/2016  . GERD (gastroesophageal reflux disease)   . Giardia 01/11/2016  . Hyperlipidemia    Past Surgical History:  Procedure Laterality Date  . ABDOMINAL HYSTERECTOMY  1999   Fibroids  . CHOLECYSTECTOMY  2004  . LITHOTRIPSY     Family History  Problem Relation  Age of Onset  . Lung cancer Mother   . Lung cancer Father   . Hypertension Sister   . COPD Sister   . COPD Sister    Social History   Socioeconomic History  . Marital status: Single    Spouse name: Not on file  . Number of children: 3  . Years of education: 8  . Highest education level: 8th grade  Occupational History  . Occupation: Disability  Social Needs  . Financial resource strain: Not hard at all  . Food insecurity:    Worry: Never true    Inability: Never true  . Transportation needs:    Medical: No    Non-medical: No  Tobacco Use  . Smoking status: Former Smoker    Packs/day: 1.00    Years: 24.00    Pack years: 24.00    Types: Cigarettes    Start date: 1990    Last attempt to quit: 11/08/2015    Years since quitting: 2.4  . Smokeless tobacco: Never Used  Substance and Sexual Activity  . Alcohol use: No  . Drug use: No  . Sexual activity: Yes  Lifestyle  . Physical activity:    Days per week: Not on file    Minutes per session: Not on file  . Stress: To some extent  Relationships  . Social connections:    Talks on phone: Not on file  Gets together: Not on file    Attends religious service: Not on file    Active member of club or organization: Not on file    Attends meetings of clubs or organizations: Not on file    Relationship status: Not on file  Other Topics Concern  . Not on file  Social History Narrative  . Not on file    Outpatient Encounter Medications as of 04/30/2018  Medication Sig  . ALPRAZolam (XANAX) 0.5 MG tablet TAKE 1 TABLET BY MOUTH AT BEDTIME  . FLUoxetine (PROZAC) 20 MG capsule TAKE 3 CAPSULES BY MOUTH EVERY DAY  . gabapentin (NEURONTIN) 300 MG capsule TAKE ONE CAPSULE BY MOUTH 3 TIMES A DAY AND TAKE 2 CAPSULES BY MOUTH AT BEDTIME  . HYDROcodone-acetaminophen (NORCO) 7.5-325 MG tablet Take 1 tablet by mouth every 4 (four) hours as needed for moderate pain.  . naproxen (NAPROSYN) 500 MG tablet TAKE 1 TABLET (500 MG TOTAL) BY  MOUTH 2 (TWO) TIMES DAILY WITH A MEAL.  Marland Kitchen NEXIUM 40 MG capsule TAKE ONE CAPSULE BY MOUTH ONCE A DAY  . pravastatin (PRAVACHOL) 40 MG tablet TAKE 1 TABLET BY MOUTH AT BEDTIME   No facility-administered encounter medications on file as of 04/30/2018.     Activities of Daily Living In your present state of health, do you have any difficulty performing the following activities: 04/30/2018  Hearing? N  Vision? N  Difficulty concentrating or making decisions? Y  Walking or climbing stairs? N  Dressing or bathing? N  Doing errands, shopping? N  Preparing Food and eating ? N  Using the Toilet? N  In the past six months, have you accidently leaked urine? N  Do you have problems with loss of bowel control? N  Managing your Medications? N  Managing your Finances? N  Housekeeping or managing your Housekeeping? N  Some recent data might be hidden    Patient Care Team: [name], MD as PCP - General (Family Medicine)    Assessment:   This is a routine wellness examination for Alejandra Little.  Exercise Activities and Dietary recommendations Current Exercise Habits: Home exercise routine, Type of exercise: stretching;walking, Time (Minutes): 20, Frequency (Times/Week): 3, Weekly Exercise (Minutes/Week): 60, Intensity: Mild, Exercise limited by: orthopedic condition(s)  Goals    None      Fall Risk Fall Risk  04/30/2018 03/18/2017 02/28/2016 06/08/2015 06/07/2015  Falls in the past year? No No Yes No No  Number falls in past yr: - - 1 - -  Injury with Fall? - - No - -  Risk for fall due to : - - Medication side effect - -  Follow up - - Falls evaluation completed - -   Is the patient's home free of loose throw rugs in walkways, pet beds, electrical cords, etc?   yes      Grab bars in the bathroom? no      Handrails on the stairs?   yes      Adequate lighting?   yes  Timed Get Up and Go performed: N/A  Depression Screen PHQ 2/9 Scores 04/30/2018 04/24/2018 03/18/2017 05/29/2016  PHQ - 2 Score 4  4 4 5   PHQ- 9 Score 8 10 9 11      Cognitive Function     6CIT Screen 04/30/2018 03/18/2017  What Year? 0 points 0 points  What month? 3 points 0 points  What time? 0 points 0 points  Count back from 20 0 points 0 points  Months in reverse  0 points 0 points  Repeat phrase 0 points 0 points  Total Score 3 0    Immunization History  Administered Date(s) Administered  . Influenza Split 08/03/2012  . Influenza,inj,Quad PF,6+ Mos 08/23/2013, 09/05/2014, 09/07/2015  . Influenza-Unspecified 09/23/2017  . Tdap 09/07/2015    Qualifies for Shingles Vaccine? Due for Shingles vaccine. Declined my offer to administer today. Education has been provided regarding the importance of this vaccine. Pt has been advised to call her insurance company to determine her out of pocket expense. Advised she may also receive this vaccine at her local pharmacy or Health Dept. Verbalized acceptance and understanding.  Screening Tests Health Maintenance  Topic Date Due  . INFLUENZA VACCINE  06/18/2018  . MAMMOGRAM  02/21/2020  . COLONOSCOPY  12/14/2022  . TETANUS/TDAP  09/06/2025  . Hepatitis C Screening  Completed  . HIV Screening  Completed  . PAP SMEAR  Discontinued    Cancer Screenings: Lung: Low Dose CT Chest recommended if Age 84-80 years, 30 pack-year currently smoking OR have quit w/in 15years. Patient does qualify, however declines having this done.  Breast:  Up to date on Mammogram? Yes   Up to date of Bone Density/Dexa? Yes Colorectal: Up to date  Additional Screenings:  Hepatitis C Screening: Up to date     Plan:  I have personally reviewed and addressed the Medicare Annual Wellness questionnaire and have noted the following in the patient's chart:  A. Medical and social history B. Use of alcohol, tobacco or illicit drugs  C. Current medications and supplements D. Functional ability and status E.  Nutritional status F.  Physical activity G. Advance directives H. List of other  physicians I.  Hospitalizations, surgeries, and ER visits in previous 12 months [place] such as hearing and vision if needed, cognitive and depression L. Referrals and appointments - none  In addition, I have reviewed and discussed with patient certain preventive protocols, quality metrics, and best practice recommendations. A written personalized care plan for preventive services as well as general preventive health recommendations were provided to patient.  See attached scanned questionnaire for additional information.   Signed,  Fabio Neighbors, LPN Nurse Health Advisor   Nurse Recommendations:

## 2018-04-30 NOTE — Progress Notes (Deleted)
       Patient: Alejandra Little Female    DOB: 08-Aug-1956   62 y.o.   MRN: [id number] Visit Date: 04/30/2018  Today's Provider: Lelon Huh, MD   No chief complaint on file.  Subjective:    HPI     No Known Allergies   Current Outpatient Medications:  .  ALPRAZolam (XANAX) 0.5 MG tablet, TAKE 1 TABLET BY MOUTH AT BEDTIME, Disp: 30 tablet, Rfl: 5 .  FLUoxetine (PROZAC) 20 MG capsule, TAKE 3 CAPSULES BY MOUTH EVERY DAY, Disp: 90 capsule, Rfl: 11 .  gabapentin (NEURONTIN) 300 MG capsule, TAKE ONE CAPSULE BY MOUTH 3 TIMES A DAY AND TAKE 2 CAPSULES BY MOUTH AT BEDTIME, Disp: 150 capsule, Rfl: 11 .  naproxen (NAPROSYN) 500 MG tablet, TAKE 1 TABLET (500 MG TOTAL) BY MOUTH 2 (TWO) TIMES DAILY WITH A MEAL., Disp: 30 tablet, Rfl: 5 .  NEXIUM 40 MG capsule, TAKE ONE CAPSULE BY MOUTH ONCE A DAY, Disp: 30 capsule, Rfl: 12 .  pravastatin (PRAVACHOL) 40 MG tablet, TAKE 1 TABLET BY MOUTH AT BEDTIME, Disp: 30 tablet, Rfl: 12  Review of Systems  Constitutional: Negative for appetite change, chills, fatigue and fever.  Respiratory: Negative for chest tightness and shortness of breath.   Cardiovascular: Negative for chest pain and palpitations.  Gastrointestinal: Negative for abdominal pain, nausea and vomiting.  Skin: Positive for rash.  Neurological: Negative for dizziness and weakness.    Social History   Tobacco Use  . Smoking status: Former Smoker    Packs/day: 1.00    Years: 24.00    Pack years: 24.00    Types: Cigarettes    Start date: 1990    Last attempt to quit: 11/08/2015    Years since quitting: 2.4  . Smokeless tobacco: Never Used  Substance Use Topics  . Alcohol use: No   Objective:   There were no vitals taken for this visit. There were no vitals filed for this visit.   Physical Exam      Assessment & Plan:           Lelon Huh, MD  [facility]

## 2018-05-05 ENCOUNTER — Other Ambulatory Visit: Payer: Self-pay | Admitting: Family Medicine

## 2018-05-05 DIAGNOSIS — F419 Anxiety disorder, unspecified: Secondary | ICD-10-CM

## 2018-05-13 ENCOUNTER — Telehealth: Payer: Self-pay | Admitting: Family Medicine

## 2018-05-13 NOTE — Telephone Encounter (Signed)
Pt called saying since she went off her Morphine 2 weeks ago she has had diarrhea.  She said everything even toast makes her have diarrhea.  Please advise  [phone]  Thanks Con Memos

## 2018-05-13 NOTE — Telephone Encounter (Signed)
Please advise 

## 2018-05-14 NOTE — Telephone Encounter (Signed)
Patient was advised. Patient stated she is using Imodium about 3 times a day. She stated she will add a probiotic and let us know how she doing by Monday.

## 2018-05-14 NOTE — Telephone Encounter (Signed)
Acknowledged and agree. 

## 2018-05-14 NOTE — Telephone Encounter (Signed)
If having multiple diarrhea stools, may use Imodium-AD 3-4 times a day and add Probiotic Supplement daily. If needing to use the Imodium-AD more than 3 days, should schedule appointment for labs to check for dehydration or infection.

## 2018-05-20 ENCOUNTER — Other Ambulatory Visit: Payer: Self-pay | Admitting: Family Medicine

## 2018-05-20 MED ORDER — HYDROCODONE-ACETAMINOPHEN 7.5-325 MG PO TABS: 1.0000 | ORAL_TABLET | ORAL | 0 refills | Status: DC | PRN

## 2018-05-20 NOTE — Telephone Encounter (Signed)
Pt contacted office for refill request on the following medications:  HYDROcodone-acetaminophen ([place]) 7.5-325 MG tablet   [facility]  Pt stated that Dr. Caryn Section has changed her dose from 5-325 mg to 7.5-325 mg. Pt stated that she needs the medication by 05/22/18 and with our office being closed tomorrow she wanted to go ahead and request the refill. Please advise. Thanks TNP

## 2018-05-28 ENCOUNTER — Ambulatory Visit: Payer: Self-pay | Admitting: Family Medicine

## 2018-06-18 ENCOUNTER — Other Ambulatory Visit: Payer: Self-pay | Admitting: Family Medicine

## 2018-06-18 NOTE — Telephone Encounter (Signed)
Pt called saying she will need a refill on the hydrocodone 7.5-325 on 06/23/18  She uses [facility]  Thanks teri

## 2018-06-19 MED ORDER — HYDROCODONE-ACETAMINOPHEN 7.5-325 MG PO TABS: 1.0000 | ORAL_TABLET | ORAL | 0 refills | Status: DC | PRN

## 2018-06-20 ENCOUNTER — Other Ambulatory Visit: Payer: Self-pay | Admitting: Family Medicine

## 2018-06-20 DIAGNOSIS — F419 Anxiety disorder, unspecified: Secondary | ICD-10-CM

## 2018-06-26 ENCOUNTER — Telehealth: Payer: Self-pay

## 2018-06-26 MED ORDER — FLUCONAZOLE 150 MG PO TABS: 150.0000 mg | ORAL_TABLET | Freq: Once | ORAL | 0 refills | Status: AC

## 2018-06-26 NOTE — Telephone Encounter (Signed)
Please advise 

## 2018-06-26 NOTE — Telephone Encounter (Signed)
Patient is on antibiotic for infected tooth.  Would like to have something sent in for yeast infection.  [facility] [phone]

## 2018-07-08 ENCOUNTER — Telehealth: Payer: Self-pay

## 2018-07-08 NOTE — Telephone Encounter (Signed)
Please advise 

## 2018-07-08 NOTE — Telephone Encounter (Signed)
Patient called office stating that she was on Amoxicillin for a tooth infection a week ago and has since had tooth extracted, patient states that she is on another round of antibiotics and has concerns of getting a yeast infection. Patient states that she is not having any vaginal symptoms ( irritation, discharge or itching) but would like to be covered in case she dose develop infection. Patient request that prescription be sent to [facility]. KW

## 2018-07-09 MED ORDER — FLUCONAZOLE 150 MG PO TABS: 150.0000 mg | ORAL_TABLET | Freq: Once | ORAL | 0 refills | Status: AC

## 2018-07-13 ENCOUNTER — Encounter: Payer: Self-pay | Admitting: Family Medicine

## 2018-07-17 ENCOUNTER — Ambulatory Visit
Admission: RE | Admit: 2018-07-17 | Discharge: 2018-07-17 | Disposition: A | Discharge status: Home / Self Care | Payer: Medicare Other | Source: Ambulatory Visit | Attending: Family Medicine | Admitting: Family Medicine

## 2018-07-17 ENCOUNTER — Ambulatory Visit (INDEPENDENT_AMBULATORY_CARE_PROVIDER_SITE_OTHER): Payer: Medicare Other | Admitting: Family Medicine

## 2018-07-17 ENCOUNTER — Encounter: Payer: Self-pay | Admitting: Family Medicine

## 2018-07-17 VITALS — BP 109/72 | HR 79 | Temp 98.6°F | Resp 16 | Wt 211.0 lb

## 2018-07-17 DIAGNOSIS — M545 Low back pain: Secondary | ICD-10-CM | POA: Diagnosis not present

## 2018-07-17 DIAGNOSIS — M5136 Other intervertebral disc degeneration, lumbar region: Secondary | ICD-10-CM | POA: Insufficient documentation

## 2018-07-17 DIAGNOSIS — M25552 Pain in left hip: Secondary | ICD-10-CM

## 2018-07-17 DIAGNOSIS — M5441 Lumbago with sciatica, right side: Secondary | ICD-10-CM | POA: Diagnosis not present

## 2018-07-17 DIAGNOSIS — M549 Dorsalgia, unspecified: Secondary | ICD-10-CM

## 2018-07-17 DIAGNOSIS — G8929 Other chronic pain: Secondary | ICD-10-CM

## 2018-07-17 DIAGNOSIS — M160 Bilateral primary osteoarthritis of hip: Secondary | ICD-10-CM | POA: Diagnosis not present

## 2018-07-17 MED ORDER — GABAPENTIN 300 MG PO CAPS: ORAL_CAPSULE | ORAL | 1 refills | Status: DC

## 2018-07-17 MED ORDER — HYDROCODONE-ACETAMINOPHEN 7.5-325 MG PO TABS: 1.0000 | ORAL_TABLET | Freq: Four times a day (QID) | ORAL | 0 refills | Status: DC | PRN

## 2018-07-17 NOTE — Patient Instructions (Signed)
Go to the [facility] on [facility] for hip and backs Xrays

## 2018-07-17 NOTE — Progress Notes (Signed)
Patient: Alejandra Little Female    DOB: 1956-01-05   62 y.o.   MRN: [id number] Visit Date: 07/17/2018  Today's Provider: Lelon Huh, MD   Chief Complaint  Patient presents with  . Back Pain  . Hip Pain   Subjective:    HPI]  Chronic low back pain, unspecified back pain laterality, with sciatica presence unspecified From 04/24/2018-Pain fairly well controlled, but she is insistent that she wants to stop taking morphine but doesn't want withdrawal symptoms. Advised she may need to take hydrocodone/apap on a more regular schedule initially to reduce withdrawal sx, changed hydrocodone/apap from 5-325 to - HYDROcodone-acetaminophen ([place]) 7.5-325 MG tablet. Discontinued Morphine ER.  Patient stated since stopping Morphine back and hip pain have worsened. Patient stated about 3 weeks ago she began having left sided groin pain. Patient states pain is so severe at times she is having trouble walking. Patient is here to discuss what else can be done about the pain. She started taking  2 x 300mg  gabapentin capsules three times a day the last couple of days with very little improved. She states she has had had shots that didn't work, and physical therapy made her worse.     No Known Allergies   Current Outpatient Medications:  .  ALPRAZolam (XANAX) 0.5 MG tablet, TAKE 1 TABLET BY MOUTH EVERYDAY AT BEDTIME, Disp: 30 tablet, Rfl: 3 .  FLUoxetine (PROZAC) 20 MG capsule, TAKE 3 CAPSULES BY MOUTH EVERY DAY, Disp: 270 capsule, Rfl: 4 .  gabapentin (NEURONTIN) 300 MG capsule, TAKE ONE CAPSULE BY MOUTH 3 TIMES A DAY AND TAKE 2 CAPSULES BY MOUTH AT BEDTIME, Disp: 150 capsule, Rfl: 11  (has been taking 2 capsules three times daily) .  HYDROcodone-acetaminophen (NORCO) 7.5-325 MG tablet, Take 1 tablet by mouth every 4 (four) hours as needed for moderate pain., Disp: 100 tablet, Rfl: 0 .  NEXIUM 40 MG capsule, TAKE ONE CAPSULE BY MOUTH ONCE A DAY, Disp: 30 capsule, Rfl: 12 .  pravastatin (PRAVACHOL)  40 MG tablet, TAKE 1 TABLET BY MOUTH AT BEDTIME, Disp: 30 tablet, Rfl: 12 .  naproxen (NAPROSYN) 500 MG tablet, TAKE 1 TABLET (500 MG TOTAL) BY MOUTH 2 (TWO) TIMES DAILY WITH A MEAL. (Patient not taking: Reported on 07/17/2018), Disp: 30 tablet, Rfl: 5  Review of Systems  Constitutional: Negative for appetite change, chills, fatigue and fever.  Respiratory: Negative for chest tightness and shortness of breath.   Cardiovascular: Negative for chest pain and palpitations.  Gastrointestinal: Negative for abdominal pain, nausea and vomiting.  Neurological: Negative for dizziness and weakness.    Social History   Tobacco Use  . Smoking status: Former Smoker    Packs/day: 1.00    Years: 24.00    Pack years: 24.00    Types: Cigarettes    Start date: 1990    Last attempt to quit: 11/08/2015    Years since quitting: 2.6  . Smokeless tobacco: Never Used  Substance Use Topics  . Alcohol use: No   Objective:   BP 109/72 (BP Location: Right Arm, Patient Position: Sitting, Cuff Size: Large)   Pulse 79   Temp 98.6 F (37 C) (Oral)   Resp 16   Wt 211 lb (95.7 kg)   SpO2 95%   BMI 35.11 kg/m  Vitals:   07/17/18 1550  BP: 109/72  Pulse: 79  Resp: 16  Temp: 98.6 F (37 C)  TempSrc: Oral  SpO2: 95%  Weight: 211 lb (95.7 kg)  Physical Exam  General appearance: alert, well developed, well nourished, cooperative and in no distress Head: Normocephalic, without obvious abnormality, atraumatic Respiratory: Respirations even and unlabored, normal respiratory rate MS: diffusely tender lumbar spine and left para lumbar muscles.      Assessment & Plan:     1. Chronic low back pain with right-sided sciatica, unspecified back pain laterality  - DG HIP UNILAT W OR W/O PELVIS 2-3 VIEWS LEFT; Future  2. Pain of left hip joint  - DG Lumbar Spine Complete; Future - DG HIP UNILAT W OR W/O PELVIS 2-3 VIEWS LEFT; Future  3. Back pain, chronic increase- gabapentin (NEURONTIN) 300 MG  capsule; Take 3 capsules three times daily  Dispense: 3 capsule; Refill: 1 Refill hydrocodone/apap        Lelon Huh, MD  [facility]

## 2018-07-21 ENCOUNTER — Telehealth: Payer: Self-pay | Admitting: Family Medicine

## 2018-07-21 NOTE — Telephone Encounter (Signed)
Pt had xray done Friday and wants to know if we have gotten the results back/  She says she is in a lot of pain in her back and groin area,  She thinks it could be kidney stones.  Pt's CB# [phone]  Thanks Con Memos

## 2018-07-21 NOTE — Telephone Encounter (Signed)
-----   Message from [name], MD sent at 07/21/2018  7:57 AM EDT ----- [place] Brunette show some arthritis in hips and lumbar spine, but otherwise normal.

## 2018-07-21 NOTE — Telephone Encounter (Signed)
Patient was notified of results. Patient wanted to know what Dr. Caryn Section believes could be causing her to have pelvic pain. Patient states pain is so severe at times she can's stand to sit or stand. Patient also believes she may have kidney stones that are not passing. Patient has an appt for CPE 07/23/18. Please advise?

## 2018-07-22 NOTE — Progress Notes (Signed)
Patient: Alejandra Little, Female    DOB: [birth year], 62 y.o.   MRN: [id number] Visit Date: 07/23/2018  Today's Provider: Lelon Huh, MD   Chief Complaint  Patient presents with  . Back Pain  . Anxiety   Subjective:    Chronic low back pain with right-sided sciatica, unspecified back pain laterality From 07/17/2018-increased gabapentin to 300 mg 3 capsules tid. Ordered x-ray of hip and spine showing degenerative changes.    Anxiety From 04/24/2018-no changes were made. Reports she is doing well on current dose of fluoxetine.   Review of Systems  Constitutional: Positive for fatigue.  Genitourinary: Positive for pelvic pain.  Musculoskeletal: Positive for arthralgias and back pain.  All other systems reviewed and are negative.   Social History      She  reports that she quit smoking about 2 years ago. Her smoking use included cigarettes. She started smoking about 29 years ago. She has a 24.00 pack-year smoking history. She has never used smokeless tobacco. She reports that she does not drink alcohol or use drugs.       Social History   Socioeconomic History  . Marital status: Single    Spouse name: Not on file  . Number of children: 3  . Years of education: 8  . Highest education level: 8th grade  Occupational History  . Occupation: Disability  Social Needs  . Financial resource strain: Not hard at all  . Food insecurity:    Worry: Never true    Inability: Never true  . Transportation needs:    Medical: No    Non-medical: No  Tobacco Use  . Smoking status: Former Smoker    Packs/day: 1.00    Years: 24.00    Pack years: 24.00    Types: Cigarettes    Start date: 1990    Last attempt to quit: 11/08/2015    Years since quitting: 2.7  . Smokeless tobacco: Never Used  Substance and Sexual Activity  . Alcohol use: No  . Drug use: No  . Sexual activity: Yes  Lifestyle  . Physical activity:    Days per week: Not on file    Minutes per session: Not on file    . Stress: To some extent  Relationships  . Social connections:    Talks on phone: Not on file    Gets together: Not on file    Attends religious service: Not on file    Active member of club or organization: Not on file    Attends meetings of clubs or organizations: Not on file    Relationship status: Not on file  Other Topics Concern  . Not on file  Social History Narrative  . Not on file    Past Medical History:  Diagnosis Date  . Anxiety   . Dog tapeworm infection 01/11/2016  . GERD (gastroesophageal reflux disease)   . Giardia 01/11/2016  . Hyperlipidemia      Patient Active Problem List   Diagnosis Date Noted  . Chronic low back pain with right-sided sciatica 12/01/2015  . Anxiety 04/05/2015  . BMI 34.0-34.9,adult 04/05/2015  . Back pain, chronic 04/05/2015  . Chronic constipation 04/05/2015  . Colon polyp 04/05/2015  . History of tobacco use 04/05/2015  . Abnormal liver enzymes 04/05/2015  . Acid reflux 04/05/2015  . 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 Arthralgia of hip 04/05/2015  . H/O renal calculi 04/05/2015  . HLD (hyperlipidemia) 04/05/2015  . Hypoglycemia 04/05/2015  .  Hemorrhoids, internal 04/05/2015  . 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 Edema, peripheral 04/05/2015  . Apnea, sleep 04/05/2015  . Has a tremor 04/05/2015  . Herpes zona 04/05/2015    Past Surgical History:  Procedure Laterality Date  . ABDOMINAL HYSTERECTOMY  1999   Fibroids  . CHOLECYSTECTOMY  2004  . LITHOTRIPSY      Family History        Family Status  Relation Name Status  . Mother  Deceased  . Father  Deceased  . Sister  Alive  . Sister  Alive       Pt's Twin Sister, spinal cord issue        Her family history includes COPD in her sister and sister; Hypertension in her sister; Lung cancer in her father and mother.      No Known Allergies   Current Outpatient Medications:  .  ALPRAZolam (XANAX) 0.5 MG tablet,  TAKE 1 TABLET BY MOUTH EVERYDAY AT BEDTIME, Disp: 30 tablet, Rfl: 3 .  FLUoxetine (PROZAC) 20 MG capsule, TAKE 3 CAPSULES BY MOUTH EVERY DAY, Disp: 270 capsule, Rfl: 4 .  gabapentin (NEURONTIN) 300 MG capsule, Take 3 capsules three times daily, Disp: 3 capsule, Rfl: 1 .  HYDROcodone-acetaminophen (NORCO) 7.5-325 MG tablet, Take 1 tablet by mouth 4 (four) times daily as needed for moderate pain., Disp: 120 tablet, Rfl: 0 .  naproxen (NAPROSYN) 500 MG tablet, TAKE 1 TABLET (500 MG TOTAL) BY MOUTH 2 (TWO) TIMES DAILY WITH A MEAL., Disp: 30 tablet, Rfl: 5 .  NEXIUM 40 MG capsule, TAKE ONE CAPSULE BY MOUTH ONCE A DAY, Disp: 30 capsule, Rfl: 12 .  pravastatin (PRAVACHOL) 40 MG tablet, TAKE 1 TABLET BY MOUTH AT BEDTIME, Disp: 30 tablet, Rfl: 12   Patient Care Team: [name], MD as PCP - General (Family Medicine)      Objective:   Vitals: BP 107/74 (BP Location: Right Arm, Patient Position: Sitting, Cuff Size: Large)   Pulse 81   Temp 98.4 F (36.9 C) (Oral)   Resp 16   Ht 5\' 5"  (1.651 m)   Wt 220 lb (99.8 kg)   SpO2 96%   BMI 36.61 kg/m    Vitals:   07/23/18 1016  BP: 107/74  Pulse: 81  Resp: 16  Temp: 98.4 F (36.9 C)  TempSrc: Oral  SpO2: 96%  Weight: 220 lb (99.8 kg)  Height: 5\' 5"  (1.651 m)     Physical Exam  General appearance: alert, well developed, well nourished, cooperative and in no distress Head: Normocephalic, without obvious abnormality, atraumatic Respiratory: Respirations even and unlabored, normal respiratory rate MS: diffusely tender lumbar spine and left para lumbar muscles.   Depression Screen PHQ 2/9 Scores 04/30/2018 04/24/2018 03/18/2017 05/29/2016  PHQ - 2 Score 4 4 4 5   PHQ- 9 Score 8 10 9 11       Assessment & Plan:    1. Pelvic pain in female  - POCT urinalysis dipstick  2. Pelvic and perineal pain Likely referred pain from lumbar spine. Worse since she stopped scheduled extended release morphine. Recommended referral to pain clinic  which she was skeptical about. She is concerned about other disease of pelvic organs. Will get some imaging of soft tissue of pelvic and lower abdomen. She will consider pain clinic if no other problems of revealed on CT.  - CT ABDOMEN PELVIS W CONTRAST; Future     --------------------------------------------------------------------    Lelon Huh, MD  [facility]

## 2018-07-22 NOTE — Telephone Encounter (Signed)
LMOVM for pt to return call 

## 2018-07-22 NOTE — Telephone Encounter (Signed)
Pelvic pain could be coming from arthritis in hips. May need to get Ct of pelvis and kidneys. Will go over it with her at CPE tomorrow.

## 2018-07-23 ENCOUNTER — Encounter: Payer: Self-pay | Admitting: Family Medicine

## 2018-07-23 ENCOUNTER — Ambulatory Visit (INDEPENDENT_AMBULATORY_CARE_PROVIDER_SITE_OTHER): Payer: Medicare Other | Admitting: Family Medicine

## 2018-07-23 VITALS — BP 107/74 | HR 81 | Temp 98.4°F | Resp 16 | Ht 65.0 in | Wt 220.0 lb

## 2018-07-23 DIAGNOSIS — R102 Pelvic and perineal pain: Secondary | ICD-10-CM | POA: Diagnosis not present

## 2018-07-23 LAB — POCT URINALYSIS DIPSTICK
Appearance: ABNORMAL
Blood, UA: NEGATIVE
Glucose, UA: NEGATIVE
Nitrite, UA: NEGATIVE
Odor: ABNORMAL
Protein, UA: POSITIVE — AB
Spec Grav, UA: 1.025 (ref 1.010–1.025)
Urobilinogen, UA: 1 E.U./dL
pH, UA: 6 (ref 5.0–8.0)

## 2018-07-23 NOTE — Telephone Encounter (Signed)
Patient was advised.  

## 2018-07-30 ENCOUNTER — Ambulatory Visit
Admission: RE | Admit: 2018-07-30 | Discharge: 2018-07-30 | Disposition: A | Discharge status: Home / Self Care | Payer: Medicare Other | Source: Ambulatory Visit | Attending: Family Medicine | Admitting: Family Medicine

## 2018-07-30 DIAGNOSIS — Q631 Lobulated, fused and horseshoe kidney: Secondary | ICD-10-CM | POA: Insufficient documentation

## 2018-07-30 DIAGNOSIS — R102 Pelvic and perineal pain: Secondary | ICD-10-CM | POA: Insufficient documentation

## 2018-07-30 DIAGNOSIS — N200 Calculus of kidney: Secondary | ICD-10-CM | POA: Diagnosis not present

## 2018-07-30 LAB — POCT I-STAT CREATININE: Creatinine, Ser: 0.7 mg/dL (ref 0.44–1.00)

## 2018-07-30 MED ORDER — IOPAMIDOL (ISOVUE-300) INJECTION 61%
100.0000 mL | Freq: Once | INTRAVENOUS | Status: AC | PRN
  Administered 2018-07-30: 100 mL via INTRAVENOUS

## 2018-07-31 ENCOUNTER — Telehealth: Payer: Self-pay | Admitting: Family Medicine

## 2018-07-31 NOTE — Telephone Encounter (Signed)
See result note.  

## 2018-07-31 NOTE — Telephone Encounter (Signed)
Please advise results? 

## 2018-07-31 NOTE — Telephone Encounter (Signed)
Pt had a CT done yesterday back hip and groin area and is wanting to know if we have gotten her results back yet  CB#  [phone]  thanks teri

## 2018-08-03 ENCOUNTER — Telehealth: Payer: Self-pay | Admitting: Family Medicine

## 2018-08-03 DIAGNOSIS — M5137 Other intervertebral disc degeneration, lumbosacral region: Secondary | ICD-10-CM

## 2018-08-03 DIAGNOSIS — M51379 Other intervertebral disc degeneration, lumbosacral region without mention of lumbar back pain or lower extremity pain: Secondary | ICD-10-CM | POA: Insufficient documentation

## 2018-08-03 DIAGNOSIS — G8929 Other chronic pain: Secondary | ICD-10-CM

## 2018-08-03 DIAGNOSIS — M5441 Lumbago with sciatica, right side: Principal | ICD-10-CM

## 2018-08-03 NOTE — Telephone Encounter (Signed)
Please advise 

## 2018-08-03 NOTE — Telephone Encounter (Signed)
Pt said she was called this morning and referred to the pain clinic.  She does not a want to be sent back to the pain clinic.  She wants to be referred to a neuro surgeon "Dr. Bethanie Dicker."  Pt's call back  [phone]  Thanks Con Memos

## 2018-08-04 ENCOUNTER — Telehealth: Payer: Self-pay | Admitting: Family Medicine

## 2018-08-04 DIAGNOSIS — G8929 Other chronic pain: Secondary | ICD-10-CM

## 2018-08-04 DIAGNOSIS — M5137 Other intervertebral disc degeneration, lumbosacral region: Secondary | ICD-10-CM

## 2018-08-04 DIAGNOSIS — M5441 Lumbago with sciatica, right side: Secondary | ICD-10-CM

## 2018-08-04 NOTE — Telephone Encounter (Signed)
Pt called back today saying she does want to go the pain clinic instead of a neurosurgeon.  Please refer her back to the pain clinic.  She talked to her daughter who is a nurse who suggest she does go to the pain clinic.  Pt's CB#  [phone]  Thanks  Con Memos

## 2018-08-25 ENCOUNTER — Other Ambulatory Visit: Payer: Self-pay | Admitting: Family Medicine

## 2018-08-25 NOTE — Telephone Encounter (Signed)
Pt contacted office for refill request on the following medications:  HYDROcodone-acetaminophen ([place]) 7.5-325 MG tablet  [facility] [address]  Last Rx: 07/17/18 LOV: 07/23/18 NOV: 01/22/2019

## 2018-08-26 MED ORDER — HYDROCODONE-ACETAMINOPHEN 7.5-325 MG PO TABS: 1.0000 | ORAL_TABLET | Freq: Four times a day (QID) | ORAL | 0 refills | Status: DC | PRN

## 2018-09-03 DIAGNOSIS — Z23 Encounter for immunization: Secondary | ICD-10-CM | POA: Diagnosis not present

## 2018-09-05 ENCOUNTER — Other Ambulatory Visit: Payer: Self-pay | Admitting: Family Medicine

## 2018-09-05 DIAGNOSIS — M549 Dorsalgia, unspecified: Principal | ICD-10-CM

## 2018-09-05 DIAGNOSIS — G8929 Other chronic pain: Secondary | ICD-10-CM

## 2018-09-24 ENCOUNTER — Other Ambulatory Visit: Payer: Self-pay

## 2018-09-24 MED ORDER — HYDROCODONE-ACETAMINOPHEN 7.5-325 MG PO TABS: 1.0000 | ORAL_TABLET | Freq: Four times a day (QID) | ORAL | 0 refills | Status: DC | PRN

## 2018-11-02 ENCOUNTER — Other Ambulatory Visit: Payer: Self-pay | Admitting: Family Medicine

## 2018-11-02 MED ORDER — HYDROCODONE-ACETAMINOPHEN 7.5-325 MG PO TABS: 1.0000 | ORAL_TABLET | Freq: Four times a day (QID) | ORAL | 0 refills | Status: DC | PRN

## 2018-11-02 NOTE — Telephone Encounter (Signed)
Patient needs refill on Hydrocodone Acet 7.5 - 325 mg. [facility] [place]

## 2018-11-02 NOTE — Telephone Encounter (Signed)
Please advise 

## 2018-12-08 ENCOUNTER — Telehealth: Payer: Self-pay | Admitting: Family Medicine

## 2018-12-08 MED ORDER — HYDROCODONE-ACETAMINOPHEN 7.5-325 MG PO TABS: 1.0000 | ORAL_TABLET | Freq: Four times a day (QID) | ORAL | 0 refills | Status: DC | PRN

## 2018-12-08 NOTE — Telephone Encounter (Signed)
Please review

## 2018-12-08 NOTE — Telephone Encounter (Signed)
Pt needing a refill on: °HYDROcodone-acetaminophen (NORCO) 7.5-325 MG tablet ° °Please fill at: ° ° °[id number] [place] [place] - [address] [phone] (Phone) °[phone] (Fax)  ° ° °Thanks, °[facility] °

## 2019-01-01 ENCOUNTER — Other Ambulatory Visit: Payer: Self-pay | Admitting: Family Medicine

## 2019-01-01 DIAGNOSIS — E785 Hyperlipidemia, unspecified: Secondary | ICD-10-CM

## 2019-01-06 IMAGING — CR DG LUMBAR SPINE COMPLETE 4+V
1 series · 5 of 5 positions shown · non-contrast
Comparison: CT abdomen 02/16/2013

CLINICAL DATA: Patient with left-sided hip and back pain. No known
injury.

EXAM:
LUMBAR SPINE - COMPLETE 4+ VIEW

[Series 1: dg lumbar spine complete 4 +v · 0.14mm/px · 5 of 5 slices shown]
[im 1/5]
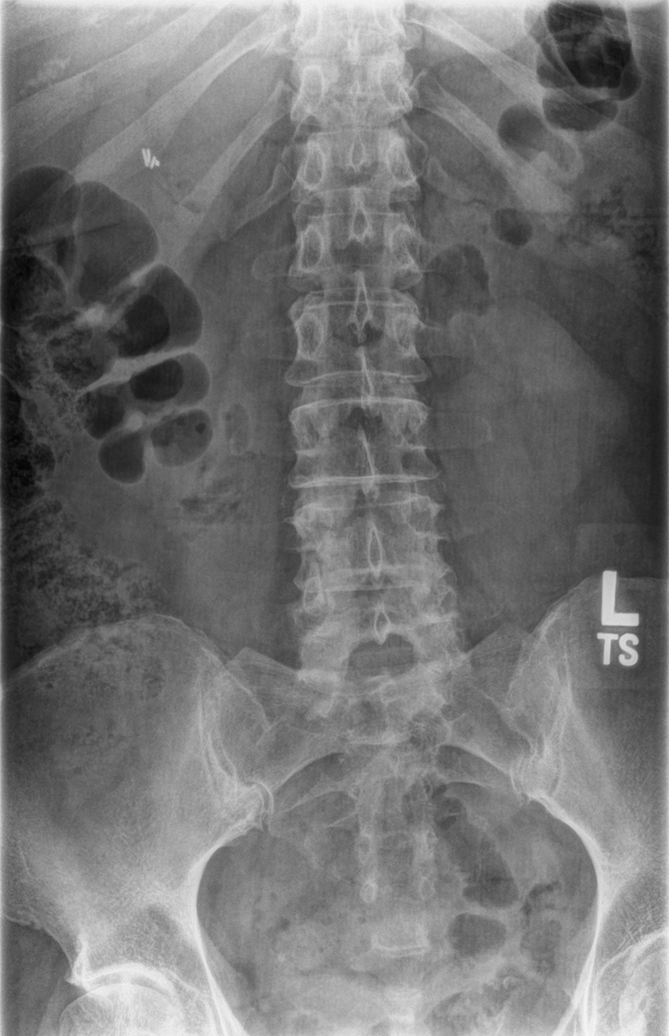
[im 2/5]
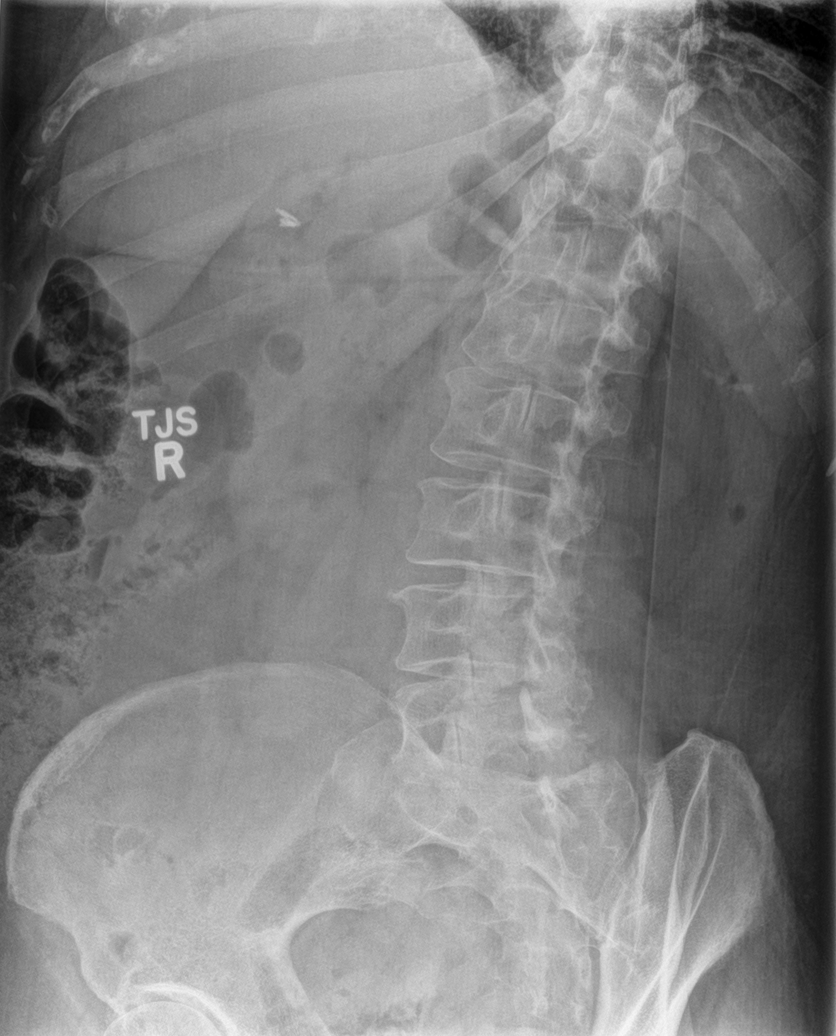
[im 3/5]
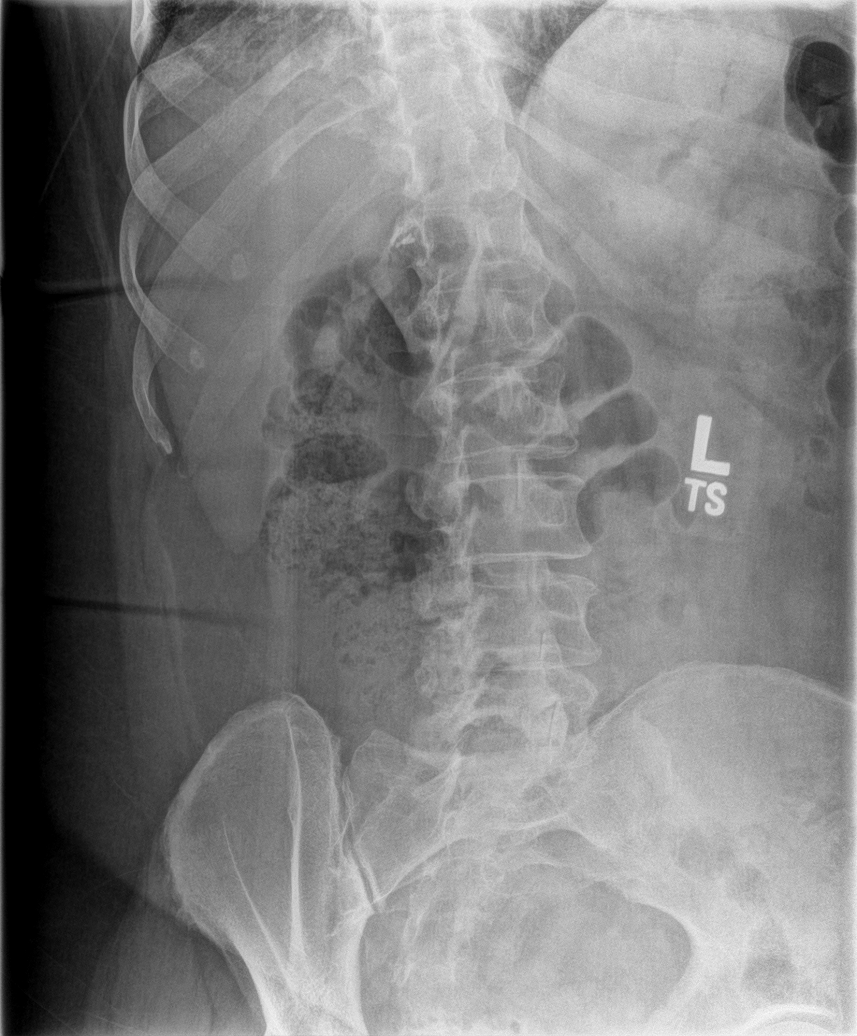
[im 4/5]
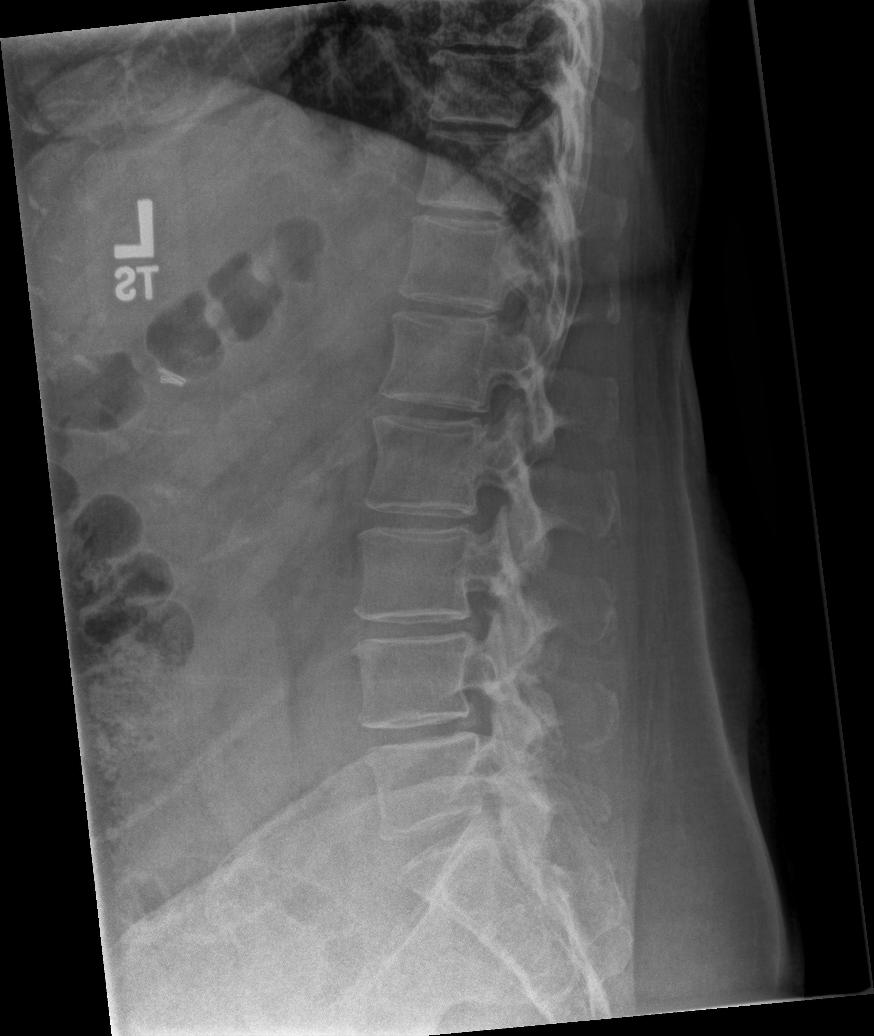
[im 5/5]
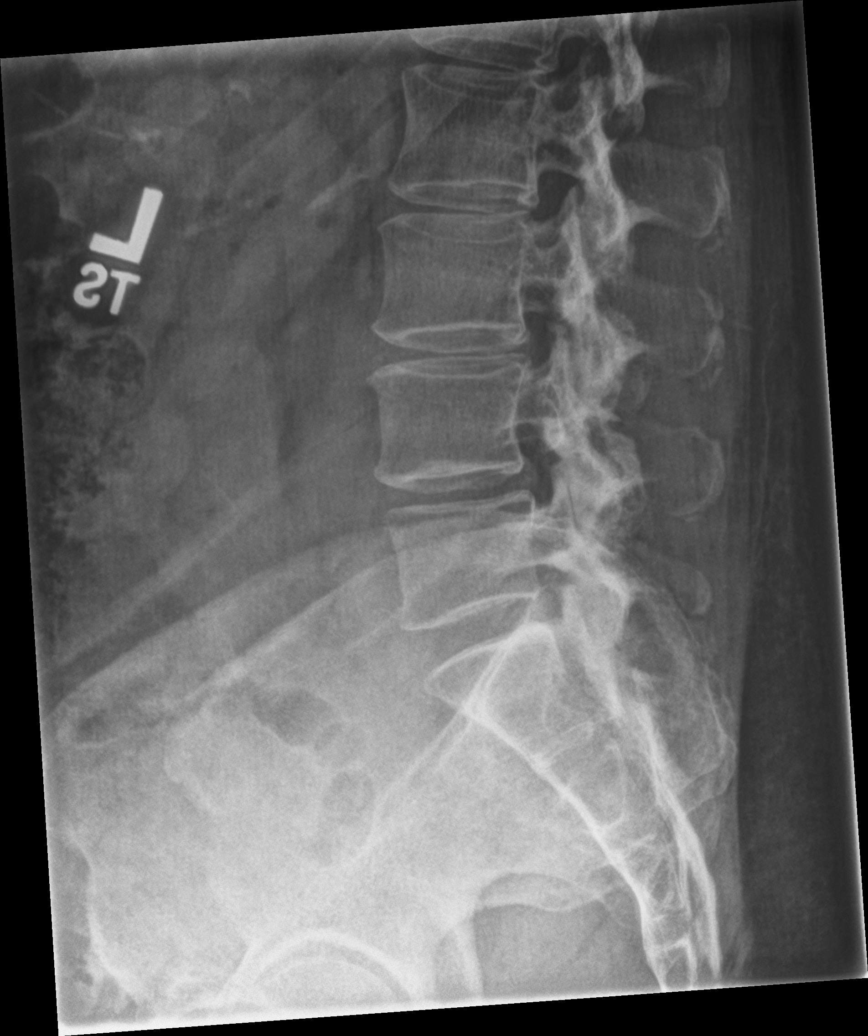

[5 of 5 positions shown; findings below may reference images not displayed]

FINDINGS: Normal anatomic alignment. Preservation of the vertebral body
heights. Degenerative disc disease most pronounced L3-4 and L4-5.
Lower lumbar spine facet degenerative changes. SI joints
unremarkable.
IMPRESSION: Lumbar spine degenerative disc and facet disease.

## 2019-01-07 ENCOUNTER — Other Ambulatory Visit: Payer: Self-pay | Admitting: Family Medicine

## 2019-01-07 MED ORDER — HYDROCODONE-ACETAMINOPHEN 7.5-325 MG PO TABS: 1.0000 | ORAL_TABLET | Freq: Four times a day (QID) | ORAL | 0 refills | Status: DC | PRN

## 2019-01-07 NOTE — Telephone Encounter (Signed)
Pt will be needing a refill on her hydrocodone 7.5-325 either 2-21  [facility] [place]  Thanks teri

## 2019-01-22 ENCOUNTER — Ambulatory Visit: Payer: Self-pay | Admitting: Family Medicine

## 2019-01-23 ENCOUNTER — Other Ambulatory Visit: Payer: Self-pay | Admitting: Family Medicine

## 2019-01-23 DIAGNOSIS — F419 Anxiety disorder, unspecified: Secondary | ICD-10-CM

## 2019-01-27 ENCOUNTER — Ambulatory Visit: Payer: Self-pay | Admitting: Family Medicine

## 2019-01-28 ENCOUNTER — Ambulatory Visit: Payer: Self-pay | Admitting: Family Medicine

## 2019-02-01 ENCOUNTER — Ambulatory Visit (INDEPENDENT_AMBULATORY_CARE_PROVIDER_SITE_OTHER): Payer: Medicare Other | Admitting: Family Medicine

## 2019-02-01 ENCOUNTER — Encounter: Payer: Self-pay | Admitting: Family Medicine

## 2019-02-01 ENCOUNTER — Other Ambulatory Visit: Payer: Self-pay

## 2019-02-01 VITALS — BP 114/70 | HR 76 | Temp 97.8°F | Resp 16 | Wt 211.0 lb

## 2019-02-01 DIAGNOSIS — R5383 Other fatigue: Secondary | ICD-10-CM

## 2019-02-01 DIAGNOSIS — M25542 Pain in joints of left hand: Secondary | ICD-10-CM | POA: Diagnosis not present

## 2019-02-01 DIAGNOSIS — M25541 Pain in joints of right hand: Secondary | ICD-10-CM

## 2019-02-01 DIAGNOSIS — E7800 Pure hypercholesterolemia, unspecified: Secondary | ICD-10-CM | POA: Diagnosis not present

## 2019-02-01 DIAGNOSIS — F0631 Mood disorder due to known physiological condition with depressive features: Secondary | ICD-10-CM | POA: Diagnosis not present

## 2019-02-01 DIAGNOSIS — F1190 Opioid use, unspecified, uncomplicated: Secondary | ICD-10-CM

## 2019-02-01 DIAGNOSIS — M5137 Other intervertebral disc degeneration, lumbosacral region: Secondary | ICD-10-CM

## 2019-02-01 NOTE — Patient Instructions (Addendum)
.   Please review the attached list of medications and notify my office if there are any errors.   . Please bring all of your medications to every appointment so we can make sure that our medication list is the same as yours.   . Please go to the lab draw station in [address] on the second floor of [facility] when you are fasting. Normal hours are 8:00am to 12:30pm and 1:30pm to 4:00pm Monday through Friday   

## 2019-02-01 NOTE — Progress Notes (Signed)
Patient: Alejandra Little Female    DOB: 1956-09-15   63 y.o.   MRN: [id number] Visit Date: 02/01/2019  Today's Provider: Lelon Huh, MD   Chief Complaint  Patient presents with  . Pain   Subjective:     HPI  Follow up for Chronic pain:  The patient was last seen for this 6 months ago. Changes made at last visit include none.  She reports good compliance with treatment. She feels that condition is Unchanged. She is not having side effects.   ------------------------------------------------------------------------------------    Lipid/Cholesterol, Follow-up:    . Last Lipid Panel:    Component Value Date/Time   CHOL 186 11/28/2017 1131   TRIG 272 (H) 11/28/2017 1131   HDL 41 11/28/2017 1131   CHOLHDL 4.5 (H) 11/28/2017 1131   LDLCALC 91 11/28/2017 1131     She reports poor compliance with treatment. She is missing pravastatin 3-4 days a week.  She is not having side effects.     Wt Readings from Last 3 Encounters:  02/01/19 211 lb (95.7 kg)  07/23/18 220 lb (99.8 kg)  07/17/18 211 lb (95.7 kg)    -------------------------------------------------------------------  Follow up anxiety, is missing fluoxexine 4-5 days a week.  Has been having episodes of feeling nervous and jittery, but also tired and fatigued.   Also reports of pain an swelling of left second finger getting worse over the last few years. States he mother had rheumatoid arthritis.   No Known Allergies   Current Outpatient Medications:  .  ALPRAZolam (XANAX) 0.5 MG tablet, TAKE 1 TABLET BY MOUTH EVERYDAY AT BEDTIME, Disp: 30 tablet, Rfl: 5 .  FLUoxetine (PROZAC) 20 MG capsule, TAKE 3 CAPSULES BY MOUTH EVERY DAY, Disp: 270 capsule, Rfl: 4 .  gabapentin (NEURONTIN) 300 MG capsule, TAKE ONE CAPSULE BY MOUTH 3 TIMES A DAY AND TAKE 2 CAPSULES BY MOUTH AT BEDTIME, Disp: 450 capsule, Rfl: 3 .  HYDROcodone-acetaminophen (NORCO) 7.5-325 MG tablet, Take 1 tablet by mouth 4 (four) times daily  as needed for moderate pain., Disp: 120 tablet, Rfl: 0 .  NEXIUM 40 MG capsule, TAKE ONE CAPSULE BY MOUTH ONCE A DAY, Disp: 30 capsule, Rfl: 12 .  pravastatin (PRAVACHOL) 40 MG tablet, TAKE 1 TABLET BY MOUTH AT BEDTIME, Disp: 90 tablet, Rfl: 4  Review of Systems  Constitutional: Negative for appetite change, chills, fatigue and fever.  Respiratory: Negative for chest tightness and shortness of breath.   Cardiovascular: Negative for chest pain and palpitations.  Gastrointestinal: Negative for abdominal pain, nausea and vomiting.  Musculoskeletal: Positive for arthralgias and back pain.       Finger deformity of the 2nd finger  Neurological: Negative for dizziness and weakness.    Social History   Tobacco Use  . Smoking status: Former Smoker    Packs/day: 1.00    Years: 24.00    Pack years: 24.00    Types: Cigarettes    Start date: 1990    Last attempt to quit: 11/08/2015    Years since quitting: 3.2  . Smokeless tobacco: Never Used  Substance Use Topics  . Alcohol use: No      Objective:   BP 114/70 (BP Location: Left Arm, Patient Position: Sitting, Cuff Size: Large)   Pulse 76   Temp 97.8 F (36.6 C) (Oral)   Resp 16   Wt 211 lb (95.7 kg)   SpO2 96% Comment: room air  BMI 35.11 kg/m  Vitals:   02/01/19 1636  BP: 114/70  Pulse: 76  Resp: 16  Temp: 97.8 F (36.6 C)  TempSrc: Oral  SpO2: 96%  Weight: 211 lb (95.7 kg)     Physical Exam   General Appearance:    Alert, cooperative, no distress  Eyes:    PERRL, conjunctiva/corneas clear, EOM's intact       Lungs:     Clear to auscultation bilaterally, respirations unlabored  Heart:    Regular rate and rhythm  MS:   Swan neck deformity of left second finger. Moderately swelling but otherwise not deformed right second finger.          Assessment & Plan    1. Arthralgia of both hands  - Rheumatoid Factor - ANA w/Reflex  2. Pure hypercholesterolemia She is tolerating pravastatin well with no adverse  effects, but not taking consistently.  - Lipid panel  3. 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   4. Other fatigue  - Comprehensive metabolic panel - TSH - CBC  5. Chronic, continuous use of opioids Pain adequately controlled on current medications.  - Pain Mgt Scrn (14 Drugs), Ur  6. DDD (degenerative disc disease), lumbosacral  - Pain Mgt Scrn (14 Drugs), Ur     Lelon Huh, MD  [facility]

## 2019-02-02 DIAGNOSIS — R5383 Other fatigue: Secondary | ICD-10-CM | POA: Diagnosis not present

## 2019-02-02 DIAGNOSIS — E7800 Pure hypercholesterolemia, unspecified: Secondary | ICD-10-CM | POA: Diagnosis not present

## 2019-02-02 DIAGNOSIS — M25542 Pain in joints of left hand: Secondary | ICD-10-CM | POA: Diagnosis not present

## 2019-02-02 DIAGNOSIS — M25541 Pain in joints of right hand: Secondary | ICD-10-CM | POA: Diagnosis not present

## 2019-02-02 LAB — PAIN MGT SCRN (14 DRUGS), UR
Amphetamine Scrn, Ur: NEGATIVE ng/mL
BARBITURATE SCREEN URINE: NEGATIVE ng/mL
BENZODIAZEPINE SCREEN, URINE: POSITIVE ng/mL — AB
Buprenorphine, Urine: NEGATIVE ng/mL
CANNABINOIDS UR QL SCN: NEGATIVE ng/mL
Cocaine (Metab) Scrn, Ur: NEGATIVE ng/mL
Creatinine(Crt), U: 212.3 mg/dL (ref 20.0–300.0)
Fentanyl, Urine: NEGATIVE pg/mL
Meperidine Screen, Urine: NEGATIVE ng/mL
Methadone Screen, Urine: NEGATIVE ng/mL
OXYCODONE+OXYMORPHONE UR QL SCN: NEGATIVE ng/mL
Opiate Scrn, Ur: POSITIVE ng/mL — AB
Ph of Urine: 5.5 (ref 4.5–8.9)
Phencyclidine Qn, Ur: NEGATIVE ng/mL
Propoxyphene Scrn, Ur: NEGATIVE ng/mL
Tramadol Screen, Urine: NEGATIVE ng/mL

## 2019-02-03 ENCOUNTER — Ambulatory Visit: Payer: Self-pay | Admitting: Family Medicine

## 2019-02-03 ENCOUNTER — Other Ambulatory Visit: Payer: Self-pay | Admitting: Family Medicine

## 2019-02-03 DIAGNOSIS — R768 Other specified abnormal immunological findings in serum: Secondary | ICD-10-CM | POA: Insufficient documentation

## 2019-02-03 DIAGNOSIS — M1990 Unspecified osteoarthritis, unspecified site: Secondary | ICD-10-CM

## 2019-02-03 LAB — CBC
Hematocrit: 40.6 % (ref 34.0–46.6)
Hemoglobin: 13.4 g/dL (ref 11.1–15.9)
MCH: 29.1 pg (ref 26.6–33.0)
MCHC: 33 g/dL (ref 31.5–35.7)
MCV: 88 fL (ref 79–97)
Platelets: 158 10*3/uL (ref 150–450)
RBC: 4.6 x10E6/uL (ref 3.77–5.28)
RDW: 12.7 % (ref 11.7–15.4)
WBC: 6.3 10*3/uL (ref 3.4–10.8)

## 2019-02-03 LAB — COMPREHENSIVE METABOLIC PANEL
ALT: 15 IU/L (ref 0–32)
AST: 19 IU/L (ref 0–40)
Albumin/Globulin Ratio: 1.9 (ref 1.2–2.2)
Albumin: 4.3 g/dL (ref 3.8–4.8)
Alkaline Phosphatase: 53 IU/L (ref 39–117)
BUN/Creatinine Ratio: 20 (ref 12–28)
BUN: 16 mg/dL (ref 8–27)
Bilirubin Total: 0.4 mg/dL (ref 0.0–1.2)
CO2: 24 mmol/L (ref 20–29)
Calcium: 9.1 mg/dL (ref 8.7–10.3)
Chloride: 102 mmol/L (ref 96–106)
Creatinine, Ser: 0.81 mg/dL (ref 0.57–1.00)
GFR calc Af Amer: 89 mL/min/{1.73_m2} (ref >59)
GFR calc non Af Amer: 77 mL/min/{1.73_m2} (ref >59)
Globulin, Total: 2.3 g/dL (ref 1.5–4.5)
Glucose: 98 mg/dL (ref 65–99)
Potassium: 3.8 mmol/L (ref 3.5–5.2)
Sodium: 143 mmol/L (ref 134–144)
Total Protein: 6.6 g/dL (ref 6.0–8.5)

## 2019-02-03 LAB — TSH: TSH: 2.3 u[IU]/mL (ref 0.450–4.500)

## 2019-02-03 LAB — LIPID PANEL
Chol/HDL Ratio: 5.8 ratio — ABNORMAL HIGH (ref 0.0–4.4)
Cholesterol, Total: 243 mg/dL — ABNORMAL HIGH (ref 100–199)
HDL: 42 mg/dL (ref >39)
LDL Calculated: 168 mg/dL — ABNORMAL HIGH (ref 0–99)
Triglycerides: 163 mg/dL — ABNORMAL HIGH (ref 0–149)
VLDL Cholesterol Cal: 33 mg/dL (ref 5–40)

## 2019-02-03 LAB — ANA W/REFLEX: Anti Nuclear Antibody(ANA): NEGATIVE

## 2019-02-03 LAB — RHEUMATOID FACTOR: Rhuematoid fact SerPl-aCnc: 18.8 IU/mL — ABNORMAL HIGH (ref 0.0–13.9)

## 2019-02-10 ENCOUNTER — Other Ambulatory Visit: Payer: Self-pay

## 2019-02-10 MED ORDER — HYDROCODONE-ACETAMINOPHEN 7.5-325 MG PO TABS: 1.0000 | ORAL_TABLET | Freq: Four times a day (QID) | ORAL | 0 refills | Status: DC | PRN

## 2019-02-26 ENCOUNTER — Other Ambulatory Visit: Payer: Self-pay | Admitting: Family Medicine

## 2019-02-26 DIAGNOSIS — K219 Gastro-esophageal reflux disease without esophagitis: Secondary | ICD-10-CM

## 2019-03-02 ENCOUNTER — Other Ambulatory Visit: Payer: Self-pay

## 2019-03-02 MED ORDER — NEXIUM 40 MG PO CPDR: 40.0000 mg | DELAYED_RELEASE_CAPSULE | Freq: Every day | ORAL | 4 refills | Status: DC

## 2019-03-02 NOTE — Telephone Encounter (Signed)
Pt states she [facility]  She states the generic does not help her reflux.    Thanks,   -Mickel Baas

## 2019-03-10 ENCOUNTER — Telehealth: Payer: Self-pay

## 2019-03-10 NOTE — Telephone Encounter (Signed)
Patient states that she cannot take the generic Nexium. She would like to have the brand name but the cost is excessive. Can you do anything to help her?

## 2019-03-11 NOTE — Telephone Encounter (Signed)
Patient states that the medication did not work. She still has symptoms after taking the medication. Prior authorization completed and approved for brand name medication. Good for 1 year.

## 2019-03-11 NOTE — Telephone Encounter (Signed)
Need to know exact medical reason she cannot take generic Nexium and dates that she tried it.   Can you check covermymeds or her pharmacy to see if there is a prior authorization for brand name Nexium?

## 2019-03-19 ENCOUNTER — Telehealth: Payer: Self-pay

## 2019-03-19 MED ORDER — HYDROCODONE-ACETAMINOPHEN 7.5-325 MG PO TABS: 1.0000 | ORAL_TABLET | Freq: Four times a day (QID) | ORAL | 0 refills | Status: DC | PRN

## 2019-03-19 NOTE — Telephone Encounter (Signed)
Patient is requesting a refill on Hydrocodone. 

## 2019-03-24 DIAGNOSIS — H2513 Age-related nuclear cataract, bilateral: Secondary | ICD-10-CM | POA: Diagnosis not present

## 2019-03-24 DIAGNOSIS — H40003 Preglaucoma, unspecified, bilateral: Secondary | ICD-10-CM | POA: Diagnosis not present

## 2019-03-29 DIAGNOSIS — M79642 Pain in left hand: Secondary | ICD-10-CM | POA: Diagnosis not present

## 2019-03-29 DIAGNOSIS — R768 Other specified abnormal immunological findings in serum: Secondary | ICD-10-CM | POA: Diagnosis not present

## 2019-03-29 DIAGNOSIS — M79641 Pain in right hand: Secondary | ICD-10-CM | POA: Diagnosis not present

## 2019-04-29 ENCOUNTER — Telehealth: Payer: Self-pay

## 2019-04-29 NOTE — Telephone Encounter (Signed)
Patient is requesting a refill on Hydrocodone. 

## 2019-04-30 MED ORDER — HYDROCODONE-ACETAMINOPHEN 7.5-325 MG PO TABS: 1.0000 | ORAL_TABLET | Freq: Four times a day (QID) | ORAL | 0 refills | Status: DC | PRN

## 2019-05-05 ENCOUNTER — Ambulatory Visit: Payer: Self-pay

## 2019-05-05 ENCOUNTER — Other Ambulatory Visit: Payer: Self-pay

## 2019-05-05 ENCOUNTER — Ambulatory Visit (INDEPENDENT_AMBULATORY_CARE_PROVIDER_SITE_OTHER): Payer: Medicare Other

## 2019-05-05 DIAGNOSIS — Z1211 Encounter for screening for malignant neoplasm of colon: Secondary | ICD-10-CM | POA: Diagnosis not present

## 2019-05-05 DIAGNOSIS — Z0000 Encounter for general adult medical examination without abnormal findings: Secondary | ICD-10-CM | POA: Diagnosis not present

## 2019-05-05 NOTE — Patient Instructions (Signed)
Alejandra Little , Thank you for taking time to come for your Medicare Wellness Visit. I appreciate your ongoing commitment to your health goals. Please review the following plan we discussed and let me know if I can assist you in the future.   Screening recommendations/referrals: Colonoscopy: Currently due, ordered today. Pt advised Gi office will contact her to set up apt.  Mammogram: Up to date, due 02/2020 Recommended yearly ophthalmology/optometry visit for glaucoma screening and checkup Recommended yearly dental visit for hygiene and checkup  Vaccinations: Influenza vaccine: Up to date Tdap vaccine: Up to date, due 09/06/25 Shingles vaccine: Pt declines today.     Advanced directives: Advance directive discussed with you today. Even though you declined this today please call our office should you change your mind and we can give you the proper paperwork for you to fill out.  Conditions/risks identified: Continue to increase water intake to 6-8 8 oz glasses a day.   Next appointment: 05/24/19 with Dr Caryn Section.   Preventive Care 40-64 Years, Female Preventive care refers to lifestyle choices and visits with your health care provider that can promote health and wellness. What does preventive care include?  A yearly physical exam. This is also called an annual well check.  Dental exams once or twice a year.  Routine eye exams. Ask your health care provider how often you should have your eyes checked.  Personal lifestyle choices, including:  Daily care of your teeth and gums.  Regular physical activity.  Eating a healthy diet.  Avoiding tobacco and drug use.  Limiting alcohol use.  Practicing safe sex.  Taking low-dose aspirin daily starting at age 22.  Taking vitamin and mineral supplements as recommended by your health care provider. What happens during an annual well check? The services and screenings done by your health care provider during your annual well check will  depend on your age, overall health, lifestyle risk factors, and family history of disease. Counseling  Your health care provider may ask you questions about your:  Alcohol use.  Tobacco use.  Drug use.  Emotional well-being.  Home and relationship well-being.  Sexual activity.  Eating habits.  Work and work Statistician.  Method of birth control.  Menstrual cycle.  Pregnancy history. Screening  You may have the following tests or measurements:  Height, weight, and BMI.  Blood pressure.  Lipid and cholesterol levels. These may be checked every 5 years, or more frequently if you are over 44 years old.  Skin check.  Lung cancer screening. You may have this screening every year starting at age 32 if you have a 30-pack-year history of smoking and currently smoke or have quit within the past 15 years.  Fecal occult blood test (FOBT) of the stool. You may have this test every year starting at age 19.  Flexible sigmoidoscopy or colonoscopy. You may have a sigmoidoscopy every 5 years or a colonoscopy every 10 years starting at age 56.  Hepatitis C blood test.  Hepatitis B blood test.  Sexually transmitted disease (STD) testing.  Diabetes screening. This is done by checking your blood sugar (glucose) after you have not eaten for a while (fasting). You may have this done every 1-3 years.  Mammogram. This may be done every 1-2 years. Talk to your health care provider about when you should start having regular mammograms. This may depend on whether you have a family history of breast cancer.  BRCA-related cancer screening. This may be done if you have a family history  of breast, ovarian, tubal, or peritoneal cancers.  Pelvic exam and Pap test. This may be done every 3 years starting at age 24. Starting at age 85, this may be done every 5 years if you have a Pap test in combination with an HPV test.  Bone density scan. This is done to screen for osteoporosis. You may have  this scan if you are at high risk for osteoporosis. Discuss your test results, treatment options, and if necessary, the need for more tests with your health care provider. Vaccines  Your health care provider may recommend certain vaccines, such as:  Influenza vaccine. This is recommended every year.  Tetanus, diphtheria, and acellular pertussis (Tdap, Td) vaccine. You may need a Td booster every 10 years.  Zoster vaccine. You may need this after age 33.  Pneumococcal 13-valent conjugate (PCV13) vaccine. You may need this if you have certain conditions and were not previously vaccinated.  Pneumococcal polysaccharide (PPSV23) vaccine. You may need one or two doses if you smoke cigarettes or if you have certain conditions. Talk to your health care provider about which screenings and vaccines you need and how often you need them. This information is not intended to replace advice given to you by your health care provider. Make sure you discuss any questions you have with your health care provider. Document Released: 12/01/2015 Document Revised: 07/24/2016 Document Reviewed: 09/05/2015 Elsevier Interactive Patient Education  2017 [place] Prevention in the Home Falls can cause injuries. They can happen to people of all ages. There are many things you can do to make your home safe and to help prevent falls. What can I do on the outside of my home?  Regularly fix the edges of walkways and driveways and fix any cracks.  Remove anything that might make you trip as you walk through a door, such as a raised step or threshold.  Trim any bushes or trees on the path to your home.  Use bright outdoor lighting.  Clear any walking paths of anything that might make someone trip, such as rocks or tools.  Regularly check to see if handrails are loose or broken. Make sure that both sides of any steps have handrails.  Any raised decks and porches should have guardrails on the  edges.  Have any leaves, snow, or ice cleared regularly.  Use sand or salt on walking paths during winter.  Clean up any spills in your garage right away. This includes oil or grease spills. What can I do in the bathroom?  Use night lights.  Install grab bars by the toilet and in the tub and shower. Do not use towel bars as grab bars.  Use non-skid mats or decals in the tub or shower.  If you need to sit down in the shower, use a plastic, non-slip stool.  Keep the floor dry. Clean up any water that spills on the floor as soon as it happens.  Remove soap buildup in the tub or shower regularly.  Attach bath mats securely with double-sided non-slip rug tape.  Do not have throw rugs and other things on the floor that can make you trip. What can I do in the bedroom?  Use night lights.  Make sure that you have a light by your bed that is easy to reach.  Do not use any sheets or blankets that are too big for your bed. They should not hang down onto the floor.  Have a firm chair that  has side arms. You can use this for support while you get dressed.  Do not have throw rugs and other things on the floor that can make you trip. What can I do in the kitchen?  Clean up any spills right away.  Avoid walking on wet floors.  Keep items that you use a lot in easy-to-reach places.  If you need to reach something above you, use a strong step stool that has a grab bar.  Keep electrical cords out of the way.  Do not use floor polish or wax that makes floors slippery. If you must use wax, use non-skid floor wax.  Do not have throw rugs and other things on the floor that can make you trip. What can I do with my stairs?  Do not leave any items on the stairs.  Make sure that there are handrails on both sides of the stairs and use them. Fix handrails that are broken or loose. Make sure that handrails are as long as the stairways.  Check any carpeting to make sure that it is firmly  attached to the stairs. Fix any carpet that is loose or worn.  Avoid having throw rugs at the top or bottom of the stairs. If you do have throw rugs, attach them to the floor with carpet tape.  Make sure that you have a light switch at the top of the stairs and the bottom of the stairs. If you do not have them, ask someone to add them for you. What else can I do to help prevent falls?  Wear shoes that:  Do not have high heels.  Have rubber bottoms.  Are comfortable and fit you well.  Are closed at the toe. Do not wear sandals.  If you use a stepladder:  Make sure that it is fully opened. Do not climb a closed stepladder.  Make sure that both sides of the stepladder are locked into place.  Ask someone to hold it for you, if possible.  Clearly mark and make sure that you can see:  Any grab bars or handrails.  First and last steps.  Where the edge of each step is.  Use tools that help you move around (mobility aids) if they are needed. These include:  Canes.  Walkers.  Scooters.  Crutches.  Turn on the lights when you go into a dark area. Replace any light bulbs as soon as they burn out.  Set up your furniture so you have a clear path. Avoid moving your furniture around.  If any of your floors are uneven, fix them.  If there are any pets around you, be aware of where they are.  Review your medicines with your doctor. Some medicines can make you feel dizzy. This can increase your chance of falling. Ask your doctor what other things that you can do to help prevent falls. This information is not intended to replace advice given to you by your health care provider. Make sure you discuss any questions you have with your health care provider. Document Released: 08/31/2009 Document Revised: 04/11/2016 Document Reviewed: 12/09/2014 Elsevier Interactive Patient Education  2017 [employer].

## 2019-05-05 NOTE — Progress Notes (Signed)
Subjective:   Alejandra Little is a 63 y.o. female who presents for Medicare Annual (Subsequent) preventive examination.    This visit is being conducted through telemedicine due to the COVID-19 pandemic. This patient has given me verbal consent via doximity to conduct this visit, patient states they are participating from their home address. Some vital signs may be absent or patient reported.    Patient identification: identified by name, DOB, and current address  Review of Systems:  N/A  Cardiac Risk Factors include: obesity (BMI >30kg/m2)     Objective:     Vitals: There were no vitals taken for this visit.  There is no height or weight on file to calculate BMI. Unable to obtain vitals due to visit being conducted via telephonically.   Advanced Directives 05/05/2019 04/30/2018 03/18/2017 02/28/2016 12/01/2015 06/07/2015  Does Patient Have a Medical Advance Directive? No No No No No No  Would patient like information on creating a medical advance directive? No - Patient declined - No - Patient declined - - No - patient declined information    Tobacco Social History   Tobacco Use  Smoking Status Former Smoker  . Packs/day: 1.00  . Years: 24.00  . Pack years: 24.00  . Types: Cigarettes  . Start date: 13  . Quit date: 11/08/2015  . Years since quitting: 3.4  Smokeless Tobacco Never Used     Counseling given: Not Answered   Clinical Intake:  Pre-visit preparation completed: Yes  Pain : 0-10 Pain Score: 7  Pain Location: Back Pain Orientation: Left, Lower Pain Descriptors / Indicators: Radiating, Aching, Throbbing Pain Frequency: Constant     Nutritional Risks: None Diabetes: No  How often do you need to have someone help you when you read instructions, pamphlets, or other written materials from your doctor or pharmacy?: 1 - Never  Interpreter Needed?: No  Information entered by :: [facility], LPN  Past Medical History:  Diagnosis Date  . Anxiety   . Dog  tapeworm infection 01/11/2016  . GERD (gastroesophageal reflux disease)   . Giardia 01/11/2016  . Hyperlipidemia    Past Surgical History:  Procedure Laterality Date  . ABDOMINAL HYSTERECTOMY  1999   Fibroids  . CHOLECYSTECTOMY  2004  . LITHOTRIPSY     Family History  Problem Relation Age of Onset  . Lung cancer Mother   . Lung cancer Father   . Hypertension Sister   . COPD Sister   . COPD Sister    Social History   Socioeconomic History  . Marital status: Single    Spouse name: Not on file  . Number of children: 3  . Years of education: 8  . Highest education level: 8th grade  Occupational History  . Occupation: Disability  Social Needs  . Financial resource strain: Not hard at all  . Food insecurity    Worry: Never true    Inability: Never true  . Transportation needs    Medical: No    Non-medical: No  Tobacco Use  . Smoking status: Former Smoker    Packs/day: 1.00    Years: 24.00    Pack years: 24.00    Types: Cigarettes    Start date: 39    Quit date: 11/08/2015    Years since quitting: 3.4  . Smokeless tobacco: Never Used  Substance and Sexual Activity  . Alcohol use: No  . Drug use: No  . Sexual activity: Yes  Lifestyle  . Physical activity    Days per  week: 0 days    Minutes per session: 0 min  . Stress: Not at all  Relationships  . Social Herbalist on phone: Patient refused    Gets together: Patient refused    Attends religious service: Patient refused    Active member of club or organization: Patient refused    Attends meetings of clubs or organizations: Patient refused    Relationship status: Patient refused  Other Topics Concern  . Not on file  Social History Narrative  . Not on file    Outpatient Encounter Medications as of 05/05/2019  Medication Sig  . ALPRAZolam (XANAX) 0.5 MG tablet TAKE 1 TABLET BY MOUTH EVERYDAY AT BEDTIME  . FLUoxetine (PROZAC) 20 MG capsule TAKE 3 CAPSULES BY MOUTH EVERY DAY  . gabapentin  (NEURONTIN) 300 MG capsule TAKE ONE CAPSULE BY MOUTH 3 TIMES A DAY AND TAKE 2 CAPSULES BY MOUTH AT BEDTIME  . HYDROcodone-acetaminophen (NORCO) 7.5-325 MG tablet Take 1 tablet by mouth 4 (four) times daily as needed for moderate pain.  Marland Kitchen NEXIUM 40 MG capsule Take 1 capsule (40 mg total) by mouth daily.  . pravastatin (PRAVACHOL) 40 MG tablet TAKE 1 TABLET BY MOUTH AT BEDTIME   No facility-administered encounter medications on file as of 05/05/2019.     Activities of Daily Living In your present state of health, do you have any difficulty performing the following activities: 05/05/2019  Hearing? N  Vision? Y  Comment Due to glaucoma- wears eye glasses daily.  Difficulty concentrating or making decisions? Y  Walking or climbing stairs? Y  Comment Due to hip and back pain.  Dressing or bathing? N  Doing errands, shopping? N  Preparing Food and eating ? N  Using the Toilet? N  In the past six months, have you accidently leaked urine? N  Do you have problems with loss of bowel control? N  Managing your Medications? N  Managing your Finances? N  Housekeeping or managing your Housekeeping? N  Some recent data might be hidden    Patient Care Team: [name], MD as PCP - General (Family Medicine)    Assessment:   This is a routine wellness examination for Alejandra Little.  Exercise Activities and Dietary recommendations Current Exercise Habits: Home exercise routine, Type of exercise: walking, Time (Minutes): 20, Frequency (Times/Week): 4, Weekly Exercise (Minutes/Week): 80, Intensity: Mild, Exercise limited by: orthopedic condition(s)  Goals    . Cut out extra servings     Recommend to cut out all junk food when snacking and replace with fruit and vegetables.     . Increase water intake     Recommend increasing water intake to 3 glasses a day.       Fall Risk: Fall Risk  05/05/2019 04/30/2018 03/18/2017 02/28/2016 06/08/2015  Falls in the past year? 0 No No Yes No  Number falls in past  yr: - - - 1 -  Injury with Fall? - - - No -  Risk for fall due to : - - - Medication side effect -  Follow up - - - Falls evaluation completed -    FALL RISK PREVENTION PERTAINING TO THE HOME:  Any stairs in or around the home? Yes  If so, are there any without handrails? No   Home free of loose throw rugs in walkways, pet beds, electrical cords, etc? Yes  Adequate lighting in your home to reduce risk of falls? Yes   ASSISTIVE DEVICES UTILIZED TO PREVENT FALLS:  Life  alert? No  Use of a cane, walker or w/c? No  Grab bars in the bathroom? No  Shower chair or bench in shower? No  Elevated toilet seat or a handicapped toilet? No    TIMED UP AND GO:  Was the test performed? No .    Depression Screen PHQ 2/9 Scores 05/05/2019 04/30/2018 04/24/2018 03/18/2017  PHQ - 2 Score 3 4 4 4   PHQ- 9 Score 7 8 10 9      Cognitive Function     6CIT Screen 05/05/2019 04/30/2018 03/18/2017  What Year? 0 points 0 points 0 points  What month? 0 points 3 points 0 points  What time? 0 points 0 points 0 points  Count back from 20 0 points 0 points 0 points  Months in reverse 0 points 0 points 0 points  Repeat phrase 0 points 0 points 0 points  Total Score 0 3 0    Immunization History  Administered Date(s) Administered  . Influenza Split 08/03/2012  . Influenza,inj,Quad PF,6+ Mos 08/23/2013, 09/05/2014, 09/07/2015, 09/03/2018  . Influenza-Unspecified 09/23/2017  . Tdap 09/07/2015    Qualifies for Shingles Vaccine? Yes. Due for Shingrix. Education has been provided regarding the importance of this vaccine. Pt has been advised to call insurance company to determine out of pocket expense. Advised may also receive vaccine at local pharmacy or Health Dept. Verbalized acceptance and understanding.  Tdap: Up to date  Flu Vaccine: Up to date   Screening Tests Health Maintenance  Topic Date Due  . COLONOSCOPY  12/14/2017  . INFLUENZA VACCINE  06/19/2019  . MAMMOGRAM  02/21/2020  . TETANUS/TDAP   09/06/2025  . Hepatitis C Screening  Completed  . HIV Screening  Completed  . PAP SMEAR-Modifier  Discontinued    Cancer Screenings:  Colorectal Screening: Completed 12/14/12. Repeat every 5 years. Ordered today. Pt aware GI office will contact her to schedule apt.   Mammogram: Completed 02/20/18.   Lung Cancer Screening: (Low Dose CT Chest recommended if Age 24-80 years, 30 pack-year currently smoking OR have quit w/in 15years.) does qualify however declines order today.   Additional Screening:  Hepatitis C Screening: Up to date  Vision Screening: Recommended annual ophthalmology exams for early detection of glaucoma and other disorders of the eye.  Dental Screening: Recommended annual dental exams for proper oral hygiene  Community Resource Referral:  CRR required this visit?  No       Plan:  I have personally reviewed and addressed the Medicare Annual Wellness questionnaire and have noted the following in the patient's chart:  A. Medical and social history B. Use of alcohol, tobacco or illicit drugs  C. Current medications and supplements D. Functional ability and status E.  Nutritional status F.  Physical activity G. Advance directives H. List of other physicians I.  Hospitalizations, surgeries, and ER visits in previous 12 months J.  [place] such as hearing and vision if needed, cognitive and depression L. Referrals and appointments   In addition, I have reviewed and discussed with patient certain preventive protocols, quality metrics, and best practice recommendations. A written personalized care plan for preventive services as well as general preventive health recommendations were provided to patient. Nurse Health Advisor  Signed,    Dashonda Bonneau [place], [place]  01/31/4007 Nurse Health Advisor   Nurse Notes: None.

## 2019-05-17 ENCOUNTER — Ambulatory Visit: Payer: Self-pay | Admitting: Family Medicine

## 2019-05-24 ENCOUNTER — Other Ambulatory Visit: Payer: Self-pay

## 2019-05-24 ENCOUNTER — Ambulatory Visit (INDEPENDENT_AMBULATORY_CARE_PROVIDER_SITE_OTHER): Payer: Medicare Other | Admitting: Family Medicine

## 2019-05-24 ENCOUNTER — Encounter: Payer: Self-pay | Admitting: Family Medicine

## 2019-05-24 VITALS — BP 130/80 | HR 64 | Temp 98.4°F | Resp 16 | Wt 212.0 lb

## 2019-05-24 DIAGNOSIS — M25542 Pain in joints of left hand: Secondary | ICD-10-CM

## 2019-05-24 DIAGNOSIS — F0631 Mood disorder due to known physiological condition with depressive features: Secondary | ICD-10-CM | POA: Diagnosis not present

## 2019-05-24 DIAGNOSIS — M25511 Pain in right shoulder: Secondary | ICD-10-CM | POA: Diagnosis not present

## 2019-05-24 DIAGNOSIS — E7800 Pure hypercholesterolemia, unspecified: Secondary | ICD-10-CM

## 2019-05-24 DIAGNOSIS — M25541 Pain in joints of right hand: Secondary | ICD-10-CM

## 2019-05-24 DIAGNOSIS — R768 Other specified abnormal immunological findings in serum: Secondary | ICD-10-CM | POA: Diagnosis not present

## 2019-05-24 NOTE — Patient Instructions (Addendum)
.   Please review the attached list of medications and notify my office if there are any errors.   . Please bring all of your medications to every appointment so we can make sure that our medication list is the same as yours.    Try OTC Voltaren Gel to help with shoulder pain   We will have flu vaccines available after Labor Day. Please go to your pharmacy or call the office in early September to schedule you flu shot.

## 2019-05-24 NOTE — Progress Notes (Signed)
Patient: Alejandra Little Female    DOB: 09/23/1956   63 y.o.   MRN: [id number] Visit Date: 05/24/2019  Today's Provider: Lelon Huh, MD   Chief Complaint  Patient presents with  . Hyperlipidemia   Subjective:     HPI  Lipid/Cholesterol, Follow-up:   Last seen for this 4 months ago.  Management changes since that visit include non; patient was counseled to take Pravastatin everyday. . Last Lipid Panel:    Component Value Date/Time   CHOL 243 (H) 02/02/2019 0846   TRIG 163 (H) 02/02/2019 0846   HDL 42 02/02/2019 0846   CHOLHDL 5.8 (H) 02/02/2019 0846   LDLCALC 168 (H) 02/02/2019 0846    Risk factors for vascular disease include hypercholesterolemia  She reports good compliance with treatment. She is not having side effects.  Current symptoms include none and have been stable. Weight trend: stable Prior visit with dietician: no Current diet: well balanced Current exercise: none  Wt Readings from Last 3 Encounters:  05/24/19 212 lb (96.2 kg)  02/01/19 211 lb (95.7 kg)  07/23/18 220 lb (99.8 kg)    -------------------------------------------------------------------  Follow up for Mood disorder:  The patient was last seen for this 4 months ago. Changes made at last visit include encouraging patient to take Fluoxetine everyday as prescribed.  She reports good compliance with treatment. She feels that condition is Improved. She is not having side effects.   ------------------------------------------------------------------------------------  Follow up for Chronic pain:  The patient was last seen for this 4 months ago. Changes made at last visit include none.  She reports good compliance with treatment. She feels that condition is Worse., but she has waned off of morphine and is only taking hydrocodone/apap now, but still on scheduled basis.  She is not having side effects.  She also reports soreness in tenderness in back of right shoulder for a  couple of weeks.  ------------------------------------------------------------------------------------  No Known Allergies   Current Outpatient Medications:  .  ALPRAZolam (XANAX) 0.5 MG tablet, TAKE 1 TABLET BY MOUTH EVERYDAY AT BEDTIME, Disp: 30 tablet, Rfl: 5 .  FLUoxetine (PROZAC) 20 MG capsule, TAKE 3 CAPSULES BY MOUTH EVERY DAY, Disp: 270 capsule, Rfl: 4 .  gabapentin (NEURONTIN) 300 MG capsule, TAKE ONE CAPSULE BY MOUTH 3 TIMES A DAY AND TAKE 2 CAPSULES BY MOUTH AT BEDTIME, Disp: 450 capsule, Rfl: 3 .  HYDROcodone-acetaminophen (NORCO) 7.5-325 MG tablet, Take 1 tablet by mouth 4 (four) times daily as needed for moderate pain., Disp: 120 tablet, Rfl: 0 .  NEXIUM 40 MG capsule, Take 1 capsule (40 mg total) by mouth daily., Disp: 90 capsule, Rfl: 4 .  pravastatin (PRAVACHOL) 40 MG tablet, TAKE 1 TABLET BY MOUTH AT BEDTIME, Disp: 90 tablet, Rfl: 4  Review of Systems  Constitutional: Negative for appetite change, chills, fatigue and fever.  Respiratory: Negative for chest tightness and shortness of breath.   Cardiovascular: Negative for chest pain and palpitations.  Gastrointestinal: Negative for abdominal pain, nausea and vomiting.  Musculoskeletal: Positive for arthralgias (throbbing ache in left shoulder and arm) and back pain.  Neurological: Positive for headaches. Negative for dizziness and weakness.    Social History   Tobacco Use  . Smoking status: Former Smoker    Packs/day: 1.00    Years: 24.00    Pack years: 24.00    Types: Cigarettes    Start date: 66    Quit date: 11/08/2015    Years since quitting: 3.5  .  Smokeless tobacco: Never Used  Substance Use Topics  . Alcohol use: No      Objective:   BP 130/80 (BP Location: Left Arm, Patient Position: Sitting, Cuff Size: Large)   Pulse 64   Temp 98.4 F (36.9 C) (Oral)   Resp 16   Wt 212 lb (96.2 kg)   SpO2 96% Comment: room air  BMI 35.28 kg/m  Vitals:   05/24/19 0951  BP: 130/80  Pulse: 64  Resp: 16   Temp: 98.4 F (36.9 C)  TempSrc: Oral  SpO2: 96%  Weight: 212 lb (96.2 kg)     Physical Exam    General Appearance:    Alert, cooperative, no distress  Eyes:    PERRL, conjunctiva/corneas clear, EOM's intact       Lungs:     Clear to auscultation bilaterally, respirations unlabored  Heart:    Regular rate and rhythm  MS:   Tender over superior trapezius on right. FROM of shoulder, but pain on limits of extension and rotation.           Assessment & Plan    1. Pure hypercholesterolemia She is tolerating pravastatin well with no adverse effects.  Taking consistently since lipids last checked.  - Lipid panel - Comprehensive metabolic panel  2. Acute pain of right shoulder Try OTC Voltaren gel.   3. Arthralgia of both hands Now off of scheduled morphine, but still taking daily oxycodone/apap Pain is worse, but tolerable on current medications.   4. Rheumatoid factor positive   5. 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 Continue current medications.      The entirety of the information documented in the History of Present Illness, Review of Systems and Physical Exam were personally obtained by me. Portions of this information were initially documented by Meyer Cory, CMA and reviewed by me for thoroughness and accuracy.   Lelon Huh, MD  [facility]

## 2019-05-25 LAB — COMPREHENSIVE METABOLIC PANEL
ALT: 13 IU/L (ref 0–32)
AST: 10 IU/L (ref 0–40)
Albumin/Globulin Ratio: 1.9 (ref 1.2–2.2)
Albumin: 4.1 g/dL (ref 3.8–4.8)
Alkaline Phosphatase: 55 IU/L (ref 39–117)
BUN/Creatinine Ratio: 26 (ref 12–28)
BUN: 18 mg/dL (ref 8–27)
Bilirubin Total: 0.2 mg/dL (ref 0.0–1.2)
CO2: 25 mmol/L (ref 20–29)
Calcium: 9.4 mg/dL (ref 8.7–10.3)
Chloride: 109 mmol/L — ABNORMAL HIGH (ref 96–106)
Creatinine, Ser: 0.69 mg/dL (ref 0.57–1.00)
GFR calc Af Amer: 106 mL/min/{1.73_m2} (ref >59)
GFR calc non Af Amer: 92 mL/min/{1.73_m2} (ref >59)
Globulin, Total: 2.2 g/dL (ref 1.5–4.5)
Glucose: 99 mg/dL (ref 65–99)
Potassium: 4.2 mmol/L (ref 3.5–5.2)
Sodium: 147 mmol/L — ABNORMAL HIGH (ref 134–144)
Total Protein: 6.3 g/dL (ref 6.0–8.5)

## 2019-05-25 LAB — LIPID PANEL
Chol/HDL Ratio: 3.9 ratio (ref 0.0–4.4)
Cholesterol, Total: 184 mg/dL (ref 100–199)
HDL: 47 mg/dL (ref >39)
LDL Calculated: 101 mg/dL — ABNORMAL HIGH (ref 0–99)
Triglycerides: 182 mg/dL — ABNORMAL HIGH (ref 0–149)
VLDL Cholesterol Cal: 36 mg/dL (ref 5–40)

## 2019-05-26 ENCOUNTER — Other Ambulatory Visit: Payer: Self-pay

## 2019-05-26 MED ORDER — HYDROCODONE-ACETAMINOPHEN 7.5-325 MG PO TABS: 1.0000 | ORAL_TABLET | Freq: Four times a day (QID) | ORAL | 0 refills | Status: DC | PRN

## 2019-06-14 DIAGNOSIS — Z20828 Contact with and (suspected) exposure to other viral communicable diseases: Secondary | ICD-10-CM | POA: Diagnosis not present

## 2019-06-22 ENCOUNTER — Ambulatory Visit: Payer: Medicare Other

## 2019-07-01 ENCOUNTER — Other Ambulatory Visit: Payer: Self-pay | Admitting: Family Medicine

## 2019-07-01 MED ORDER — HYDROCODONE-ACETAMINOPHEN 7.5-325 MG PO TABS: 1.0000 | ORAL_TABLET | Freq: Four times a day (QID) | ORAL | 0 refills | Status: DC | PRN

## 2019-07-01 NOTE — Telephone Encounter (Signed)
Pt needs a refill  Hydrocodone 7.5-325  [facility]  CB#  [phone]  [place]

## 2019-07-01 NOTE — Telephone Encounter (Signed)
Patient of Dr. Caryn Section and L.O.V. was 05/24/2019. Please advise.

## 2019-07-07 DIAGNOSIS — H40003 Preglaucoma, unspecified, bilateral: Secondary | ICD-10-CM | POA: Diagnosis not present

## 2019-07-08 ENCOUNTER — Other Ambulatory Visit: Payer: Self-pay | Admitting: Family Medicine

## 2019-07-08 DIAGNOSIS — M549 Dorsalgia, unspecified: Secondary | ICD-10-CM

## 2019-07-08 DIAGNOSIS — G8929 Other chronic pain: Secondary | ICD-10-CM

## 2019-07-11 ENCOUNTER — Other Ambulatory Visit: Payer: Self-pay | Admitting: Family Medicine

## 2019-07-11 DIAGNOSIS — F419 Anxiety disorder, unspecified: Secondary | ICD-10-CM

## 2019-07-13 ENCOUNTER — Telehealth: Payer: Self-pay

## 2019-07-13 NOTE — Telephone Encounter (Signed)
Patient advised. Appointment scheduled 07/19/2019 at 10:40am. Patient requested this day. She says she wants to wait to see if symptoms improved. Patient will call back if she doesn't  need this appointment.

## 2019-07-13 NOTE — Telephone Encounter (Signed)
There's no way to know without coming in and getting checked out, needs u/a and maybe some xrays.

## 2019-07-13 NOTE — Telephone Encounter (Signed)
Patient called and stated that she is having  A lot of pressure during urination and back pain. Patient states that she did not want to come into be seen again and wanted to know what could be done about her pain. Patient states that she was wondering if she may have kidney stones. Please advise.

## 2019-07-19 ENCOUNTER — Ambulatory Visit: Payer: Self-pay | Admitting: Family Medicine

## 2019-08-03 ENCOUNTER — Telehealth: Payer: Self-pay | Admitting: Family Medicine

## 2019-08-03 MED ORDER — HYDROCODONE-ACETAMINOPHEN 7.5-325 MG PO TABS: 1.0000 | ORAL_TABLET | Freq: Four times a day (QID) | ORAL | 0 refills | Status: DC | PRN

## 2019-08-03 NOTE — Telephone Encounter (Signed)
Please review. KW 

## 2019-08-03 NOTE — Telephone Encounter (Signed)
Pt calling for a refill on:  HYDROcodone-acetaminophen ([place]) 7.5-325 MG tablet   Please fill at:  [facility] #[id number] - [place], [place] - [address] [phone] (Phone) [phone] (Fax)   Thanks, [employer]

## 2019-08-09 ENCOUNTER — Other Ambulatory Visit: Payer: Self-pay | Admitting: Family Medicine

## 2019-08-09 DIAGNOSIS — F419 Anxiety disorder, unspecified: Secondary | ICD-10-CM

## 2019-08-26 DIAGNOSIS — Z23 Encounter for immunization: Secondary | ICD-10-CM | POA: Diagnosis not present

## 2019-09-03 ENCOUNTER — Telehealth: Payer: Self-pay | Admitting: Family Medicine

## 2019-09-03 MED ORDER — HYDROCODONE-ACETAMINOPHEN 7.5-325 MG PO TABS: 1.0000 | ORAL_TABLET | Freq: Four times a day (QID) | ORAL | 0 refills | Status: DC | PRN

## 2019-09-03 NOTE — Telephone Encounter (Signed)
Last filled 08/03/19. KW

## 2019-09-03 NOTE — Telephone Encounter (Signed)
Pt needing a refill on: °HYDROcodone-acetaminophen (NORCO) 7.5-325 MG tablet ° °Please fill at: ° ° °[id number] [place], [place] - [address] [phone] (Phone) °[phone] (Fax)  ° ° °Thanks, °[facility] °

## 2019-10-15 ENCOUNTER — Other Ambulatory Visit: Payer: Self-pay | Admitting: Family Medicine

## 2019-10-15 MED ORDER — HYDROCODONE-ACETAMINOPHEN 7.5-325 MG PO TABS: 1.0000 | ORAL_TABLET | Freq: Four times a day (QID) | ORAL | 0 refills | Status: DC | PRN

## 2019-10-15 NOTE — Telephone Encounter (Signed)
Medication Refill - MedicationHYDROcodone-acetaminophen (NORCO) 7.5-325 MG tablet PC:[id number] :   Has the patient contacted their pharmacy? No. (Agent: If no, request that the patient contact the pharmacy for the refill.) (Agent: If yes, when and what did the pharmacy advise?)  Preferred Pharmacy (with phone number or street name):  [id number] [place], [place] - [address] [phone] (Phone)     Agent: Please be advised that RX refills may take up to 3 business days. We ask that you follow-up with your pharmacy.

## 2019-11-15 ENCOUNTER — Other Ambulatory Visit: Payer: Self-pay | Admitting: Family Medicine

## 2019-11-15 MED ORDER — HYDROCODONE-ACETAMINOPHEN 7.5-325 MG PO TABS: 1.0000 | ORAL_TABLET | Freq: Four times a day (QID) | ORAL | 0 refills | Status: DC | PRN

## 2019-11-15 NOTE — Telephone Encounter (Signed)
Copied from [place] [phone]. Topic: Quick Communication - Rx Refill/Question >> Nov 15, 2019  9:52 AM Rainey Pines A wrote: Medication: HYDROcodone-acetaminophen (NORCO) 7.5-325 MG tablet   Has the patient contacted their pharmacy? Yes (Agent: If no, request that the patient contact the pharmacy for the refill.) (Agent: If yes, when and what did the pharmacy advise?)Contact pcp  Preferred Pharmacy (with phone number or street name): [id number] [place], [place] - [address]  Phone:  [phone] Fax:  [phone]     Agent: Please be advised that RX refills may take up to 3 business days. We ask that you follow-up with your pharmacy.

## 2019-11-29 ENCOUNTER — Telehealth: Payer: Self-pay | Admitting: Family Medicine

## 2019-11-29 ENCOUNTER — Encounter: Payer: Self-pay | Admitting: Family Medicine

## 2019-11-29 ENCOUNTER — Ambulatory Visit (INDEPENDENT_AMBULATORY_CARE_PROVIDER_SITE_OTHER): Payer: Medicare Other | Admitting: Family Medicine

## 2019-11-29 VITALS — BP 135/70 | HR 72

## 2019-11-29 DIAGNOSIS — M545 Low back pain, unspecified: Secondary | ICD-10-CM

## 2019-11-29 DIAGNOSIS — F419 Anxiety disorder, unspecified: Secondary | ICD-10-CM

## 2019-11-29 DIAGNOSIS — M5137 Other intervertebral disc degeneration, lumbosacral region: Secondary | ICD-10-CM

## 2019-11-29 DIAGNOSIS — G8929 Other chronic pain: Secondary | ICD-10-CM | POA: Diagnosis not present

## 2019-11-29 NOTE — Telephone Encounter (Signed)
Can you please call and schedule patient for routine follow up & labs in 6 months. Thanks!

## 2019-11-29 NOTE — Telephone Encounter (Signed)
Patient scheduled for a 6 month follow up

## 2019-11-29 NOTE — Progress Notes (Signed)
Patient: Alejandra Little Female    DOB: 01/16/1956   64 y.o.   MRN: [id number] Visit Date: 11/29/2019  Today's Provider: Lelon Huh, MD   Chief Complaint  Patient presents with  . Hyperlipidemia   Subjective:     HPI   Virtual Visit via Telephone Note  I connected with [name] on [id number] at 11:00 AM EST by telephone and verified that I am speaking with the correct person using two identifiers.  Location: Patient: Home Provider: [facility]   I discussed the limitations, risks, security and privacy concerns of performing an evaluation and management service by telephone and the availability of in person appointments. I also discussed with the patient that there may be a patient responsible charge related to this service. The patient expressed understanding and agreed to proceed.   Lipid/Cholesterol, Follow-up:   Last seen for this 6 months ago.  Management changes since that visit include no changes. . Last Lipid Panel:    Component Value Date/Time   CHOL 184 05/24/2019 1019   TRIG 182 (H) 05/24/2019 1019   HDL 47 05/24/2019 1019   CHOLHDL 3.9 05/24/2019 1019   LDLCALC 101 (H) 05/24/2019 1019    Risk factors for vascular disease include hypercholesterolemia  She reports good compliance with treatment. She is not having side effects.  Current symptoms include none and have been stable. Weight trend: fluctuating a bit Prior visit with dietician: no Current diet: well balanced Current exercise: none  Wt Readings from Last 3 Encounters:  05/24/19 212 lb (96.2 kg)  02/01/19 211 lb (95.7 kg)  07/23/18 220 lb (99.8 kg)    -------------------------------------------------------------------  Follow up for Arthralgia of both hands:  The patient was last seen for this 6 months ago. Changes made at last visit include none.  She reports good compliance with treatment. She feels that condition is Unchanged. She is not having side  effects.   ------------------------------------------------------------------------------------  Follow up for Mood disorder:  The patient was last seen for this 6 months ago. Changes made at last visit include none.  She reports good compliance with treatment. She feels that condition is Unchanged. She is not having side effects.   ------------------------------------------------------------------------------------  No Known Allergies   Current Outpatient Medications:  .  ALPRAZolam (XANAX) 0.5 MG tablet, TAKE 1 TABLET BY MOUTH EVERYDAY AT BEDTIME, Disp: 30 tablet, Rfl: 3 .  FLUoxetine (PROZAC) 20 MG capsule, TAKE 3 CAPSULES BY MOUTH EVERY DAY, Disp: 270 capsule, Rfl: 4 .  gabapentin (NEURONTIN) 300 MG capsule, TAKE ONE CAPSULE BY MOUTH 3 TIMES A DAY AND TAKE 2 CAPSULES BY MOUTH AT BEDTIME, Disp: 450 capsule, Rfl: 4 .  HYDROcodone-acetaminophen (NORCO) 7.5-325 MG tablet, Take 1 tablet by mouth 4 (four) times daily as needed for moderate pain., Disp: 120 tablet, Rfl: 0 .  NEXIUM 40 MG capsule, Take 1 capsule (40 mg total) by mouth daily., Disp: 90 capsule, Rfl: 4 .  pravastatin (PRAVACHOL) 40 MG tablet, TAKE 1 TABLET BY MOUTH AT BEDTIME, Disp: 90 tablet, Rfl: 4  Review of Systems  Constitutional: Negative for appetite change, chills, fatigue and fever.  Respiratory: Negative for chest tightness and shortness of breath.   Cardiovascular: Negative for chest pain and palpitations.  Gastrointestinal: Negative for abdominal pain, nausea and vomiting.  Neurological: Negative for dizziness and weakness.    Social History   Tobacco Use  . Smoking status: Former Smoker    Packs/day: 1.00    Years:  24.00    Pack years: 24.00    Types: Cigarettes    Start date: 87    Quit date: 11/08/2015    Years since quitting: 4.0  . Smokeless tobacco: Never Used  Substance Use Topics  . Alcohol use: No      Objective:   BP 135/70   Pulse 72  Self reported  Physical Exam Awake,  alert, oriented x 3. In no apparent distress       Assessment & Plan    1. Anxiety Doing well on current regiment of fluoxetine and alprazolam hs  2. DDD (degenerative disc disease), lumbosacral   3. Chronic low back pain, unspecified back pain laterality, unspecified whether sciatica present Well controlled with no adverse effects from current medication regiment.   Follow up 6 months.    I discussed the assessment and treatment plan with the patient. The patient was provided an opportunity to ask questions and all were answered. The patient agreed with the plan and demonstrated an understanding of the instructions.   The patient was advised to call back or seek an in-person evaluation if the symptoms worsen or if the condition fails to improve as anticipated.  I provided 12 minutes of non-face-to-face time during this encounter.      Lelon Huh, MD  [facility]

## 2019-12-09 ENCOUNTER — Other Ambulatory Visit: Payer: Self-pay | Admitting: Family Medicine

## 2019-12-09 DIAGNOSIS — F419 Anxiety disorder, unspecified: Secondary | ICD-10-CM

## 2019-12-10 DIAGNOSIS — Z20828 Contact with and (suspected) exposure to other viral communicable diseases: Secondary | ICD-10-CM | POA: Diagnosis not present

## 2019-12-17 ENCOUNTER — Other Ambulatory Visit: Payer: Self-pay | Admitting: Family Medicine

## 2019-12-17 MED ORDER — HYDROCODONE-ACETAMINOPHEN 7.5-325 MG PO TABS: 1.0000 | ORAL_TABLET | Freq: Four times a day (QID) | ORAL | 0 refills | Status: DC | PRN

## 2019-12-17 NOTE — Telephone Encounter (Signed)
HYDROcodone-acetaminophen (NORCO) 7.5-325 MG tablet     Patient is requesting a refill.    [facility]  [id number] [place], [place] - [address] Phone:  [phone]  Fax:  [phone]

## 2019-12-17 NOTE — Telephone Encounter (Signed)
Requested medication (s) are due for refill today: yes  Requested medication (s) are on the active medication list: yes  Last refill:  11/15/2019  Future visit scheduled: yes  Notes to clinic:  this refill cannot be delegated    Requested Prescriptions  Pending Prescriptions Disp Refills   HYDROcodone-acetaminophen (NORCO) 7.5-325 MG tablet 120 tablet 0    Sig: Take 1 tablet by mouth 4 (four) times daily as needed for moderate pain.      Not Delegated - Analgesics:  Opioid Agonist Combinations Failed - 12/17/2019  1:39 PM      Failed - This refill cannot be delegated      Failed - Urine Drug Screen completed in last 360 days.      Failed - Valid encounter within last 6 months    Recent Outpatient Visits           2 weeks ago [place], Donald E, MD   6 months ago Pure hypercholesterolemia   [facility] [name], MD   10 months ago Arthralgia of both hands   [facility] [name], MD   1 year ago Pelvic pain in female   [facility] [name], MD   1 year ago Chronic low back pain with right-sided sciatica, unspecified back pain laterality   [facility] [name], MD       Future Appointments             In 4 months  [facility], [place]   In 5 months Fisher, Kirstie Peri, MD [facility], [place]

## 2019-12-24 ENCOUNTER — Emergency Department
Admission: EM | Admit: 2019-12-24 | Discharge: 2019-12-24 | Disposition: A | Discharge status: Home / Self Care | Payer: Medicare Other | Attending: Emergency Medicine | Admitting: Emergency Medicine

## 2019-12-24 ENCOUNTER — Ambulatory Visit: Payer: Self-pay | Admitting: *Deleted

## 2019-12-24 ENCOUNTER — Emergency Department: Payer: Medicare Other

## 2019-12-24 ENCOUNTER — Encounter: Payer: Self-pay | Admitting: Intensive Care

## 2019-12-24 ENCOUNTER — Other Ambulatory Visit: Payer: Self-pay

## 2019-12-24 DIAGNOSIS — Z20822 Contact with and (suspected) exposure to covid-19: Secondary | ICD-10-CM | POA: Diagnosis not present

## 2019-12-24 DIAGNOSIS — Z79899 Other long term (current) drug therapy: Secondary | ICD-10-CM | POA: Diagnosis not present

## 2019-12-24 DIAGNOSIS — R0789 Other chest pain: Secondary | ICD-10-CM | POA: Insufficient documentation

## 2019-12-24 DIAGNOSIS — R42 Dizziness and giddiness: Secondary | ICD-10-CM | POA: Insufficient documentation

## 2019-12-24 DIAGNOSIS — R519 Headache, unspecified: Secondary | ICD-10-CM | POA: Diagnosis not present

## 2019-12-24 DIAGNOSIS — Z87891 Personal history of nicotine dependence: Secondary | ICD-10-CM | POA: Diagnosis not present

## 2019-12-24 DIAGNOSIS — R079 Chest pain, unspecified: Secondary | ICD-10-CM

## 2019-12-24 HISTORY — DX: Unspecified osteoarthritis, unspecified site: M19.90

## 2019-12-24 HISTORY — DX: Other chronic pain: G89.29

## 2019-12-24 LAB — BASIC METABOLIC PANEL
Anion gap: 9 (ref 5–15)
BUN: 15 mg/dL (ref 8–23)
CO2: 27 mmol/L (ref 22–32)
Calcium: 9 mg/dL (ref 8.9–10.3)
Chloride: 104 mmol/L (ref 98–111)
Creatinine, Ser: 0.64 mg/dL (ref 0.44–1.00)
GFR calc Af Amer: >60 mL/min (ref >60)
GFR calc non Af Amer: >60 mL/min (ref >60)
Glucose, Bld: 94 mg/dL (ref 70–99)
Potassium: 4.1 mmol/L (ref 3.5–5.1)
Sodium: 140 mmol/L (ref 135–145)

## 2019-12-24 LAB — CBC
HCT: 42.3 % (ref 36.0–46.0)
Hemoglobin: 13.9 g/dL (ref 12.0–15.0)
MCH: 29.9 pg (ref 26.0–34.0)
MCHC: 32.9 g/dL (ref 30.0–36.0)
MCV: 91 fL (ref 80.0–100.0)
Platelets: 152 10*3/uL (ref 150–400)
RBC: 4.65 MIL/uL (ref 3.87–5.11)
RDW: 12.4 % (ref 11.5–15.5)
WBC: 6.9 10*3/uL (ref 4.0–10.5)
nRBC: 0 % (ref 0.0–0.2)

## 2019-12-24 LAB — POC SARS CORONAVIRUS 2 AG: SARS Coronavirus 2 Ag: NEGATIVE

## 2019-12-24 LAB — TROPONIN I (HIGH SENSITIVITY)
Troponin I (High Sensitivity): 3 ng/L (ref <18)
Troponin I (High Sensitivity): 4 ng/L (ref <18)

## 2019-12-24 MED ORDER — MECLIZINE HCL 25 MG PO TABS
25.0000 mg | ORAL_TABLET | Freq: Once | ORAL | Status: AC
  Administered 2019-12-24: 18:00:00 25 mg via ORAL
  Filled 2019-12-24: qty 1

## 2019-12-24 MED ORDER — MECLIZINE HCL 25 MG PO TABS: 25.0000 mg | ORAL_TABLET | Freq: Three times a day (TID) | ORAL | 0 refills | Status: DC | PRN

## 2019-12-24 NOTE — ED Notes (Signed)
Spoke with daughter  Explained the wait  She also wanted [place] to know that she has family members that are positive for COVID

## 2019-12-24 NOTE — ED Notes (Signed)
Pt states she is willing to do rapid covid

## 2019-12-24 NOTE — ED Triage Notes (Signed)
Pt c/o chest pressure since this AM with left arm pain, nausea, and dizziness

## 2019-12-24 NOTE — Discharge Instructions (Addendum)
Please seek medical attention for any high fevers, chest pain, shortness of breath, change in behavior, persistent vomiting, bloody stool or any other new or concerning symptoms.  

## 2019-12-24 NOTE — ED Provider Notes (Signed)
[facility] Emergency Department Provider Note  ____________________________________________   I have reviewed the triage vital signs and the nursing notes.   HISTORY  Chief Complaint Dizziness  History limited by: Not Limited   HPI Alejandra Little is a 64 y.o. female who presents to the emergency department today because of concern for dizziness and chest pressure. The patient states that when she woke up this morning and tried to get out of bed she had sudden onset of intense dizziness. It was the feeling of the room spinning around her. She had to lie back down, when she tried to get up again the spinning happened again. Later in the day she developed some chest pressure with left arm pain. She says she has been having this sensation on and off over the past three weeks. The patient has also had exposure to covid through her family, but denies any fevers or change in taste or smell.   Records reviewed. Per medical record review patient has a history of GERD, HLD.   Past Medical History:  Diagnosis Date  . Anxiety   . Arthritis   . Chronic back pain   . Dog tapeworm infection 01/11/2016  . GERD (gastroesophageal reflux disease)   . Giardia 01/11/2016  . Hyperlipidemia     Patient Active Problem List   Diagnosis Date Noted  . Rheumatoid factor positive 02/03/2019  . DDD (degenerative disc disease), lumbosacral 08/03/2018  . Chronic low back pain with right-sided sciatica 12/01/2015  . Anxiety 04/05/2015  . BMI 34.0-34.9,adult 04/05/2015  . Back pain, chronic 04/05/2015  . Chronic constipation 04/05/2015  . Colon polyp 04/05/2015  . History of tobacco use 04/05/2015  . Abnormal liver enzymes 04/05/2015  . Acid reflux 04/05/2015  . 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 Arthralgia of hip 04/05/2015  . H/O renal calculi 04/05/2015  . HLD (hyperlipidemia) 04/05/2015  . Hypoglycemia 04/05/2015  . Hemorrhoids, internal 04/05/2015  . Mood disorder with  depressive features due to general medical condition 04/05/2015  . Edema, peripheral 04/05/2015  . Apnea, sleep 04/05/2015  . Has a tremor 04/05/2015  . Herpes zona 04/05/2015    Past Surgical History:  Procedure Laterality Date  . ABDOMINAL HYSTERECTOMY  1999   Fibroids  . CHOLECYSTECTOMY  2004  . LITHOTRIPSY      Prior to Admission medications   Medication Sig Start Date End Date Taking? Authorizing Provider  ALPRAZolam Duanne Moron) 0.5 MG tablet TAKE 1 TABLET BY MOUTH EVERYDAY AT BEDTIME 12/10/19   [name], MD  FLUoxetine (PROZAC) 20 MG capsule TAKE 3 CAPSULES BY MOUTH EVERY DAY 07/11/19   [name], MD  gabapentin (NEURONTIN) 300 MG capsule TAKE ONE CAPSULE BY MOUTH 3 TIMES A DAY AND TAKE 2 CAPSULES BY MOUTH AT BEDTIME 07/08/19   [name], MD  HYDROcodone-acetaminophen (NORCO) 7.5-325 MG tablet Take 1 tablet by mouth 4 (four) times daily as needed for moderate pain. 12/17/19   [name], MD  NEXIUM 40 MG capsule Take 1 capsule (40 mg total) by mouth daily. 03/02/19   [name], MD  pravastatin (PRAVACHOL) 40 MG tablet TAKE 1 TABLET BY MOUTH AT BEDTIME 01/01/19   [name], MD    Allergies Patient has no known allergies.  Family History  Problem Relation Age of Onset  . Lung cancer Mother   . Lung cancer Father   . Hypertension Sister   . COPD Sister   . COPD Sister  Social History Social History   Tobacco Use  . Smoking status: Former Smoker    Packs/day: 1.00    Years: 24.00    Pack years: 24.00    Types: Cigarettes    Start date: 53    Quit date: 11/08/2015    Years since quitting: 4.1  . Smokeless tobacco: Never Used  Substance Use Topics  . Alcohol use: No  . Drug use: Yes    Comment: prescribed hydrocodone     Review of Systems Constitutional: No fever/chills Eyes: No visual changes. ENT: No sore throat. Cardiovascular: Positive for chest pressure.  Respiratory: Denies shortness of breath. Gastrointestinal:  No abdominal pain.  No nausea, no vomiting.  No diarrhea.   Genitourinary: Negative for dysuria. Musculoskeletal: Positive for left arm pain.  Skin: Negative for rash. Neurological: Negative for headaches, focal weakness or numbness.  ____________________________________________   PHYSICAL EXAM:  VITAL SIGNS: ED Triage Vitals  Enc Vitals Group     BP 12/24/19 1509 134/79     Pulse Rate 12/24/19 1509 84     Resp 12/24/19 1509 18     Temp 12/24/19 1509 98.4 F (36.9 C)     Temp Source 12/24/19 1509 Oral     SpO2 12/24/19 1509 97 %     Weight 12/24/19 1512 208 lb (94.3 kg)     Height 12/24/19 1512 5\' 5"  (1.651 m)     Head Circumference --      Peak Flow --      Pain Score 12/24/19 1511 6   Constitutional: Alert and oriented.  Eyes: Conjunctivae are normal.  ENT      Head: Normocephalic and atraumatic.      Nose: No congestion/rhinnorhea.      Mouth/Throat: Mucous membranes are moist.      Neck: No stridor. Hematological/Lymphatic/Immunilogical: No cervical lymphadenopathy. Cardiovascular: Normal rate, regular rhythm.  No murmurs, rubs, or gallops.  Respiratory: Normal respiratory effort without tachypnea nor retractions. Breath sounds are clear and equal bilaterally. No wheezes/rales/rhonchi. Gastrointestinal: Soft and non tender. No rebound. No guarding.  Genitourinary: Deferred Musculoskeletal: Normal range of motion in all extremities. No lower extremity edema. Neurologic:  Normal speech and language. No gross focal neurologic deficits are appreciated.  Skin:  Skin is warm, dry and intact. No rash noted. Psychiatric: Mood and affect are normal. Speech and behavior are normal. Patient exhibits appropriate insight and judgment.  ____________________________________________    LABS (pertinent positives/negatives)  Trop hs 3 -> 4 CBC wbc 6.9, hgb 13.9, plt 152 BMP wnl COVID negative ____________________________________________   EKG  I, Nance Pear, attending  physician, personally viewed and interpreted this EKG  EKG Time: 1515 Rate: 84 Rhythm: normal sinus rhythm Axis: left axis deviation Intervals: qtc 415 QRS: LAFB ST changes: no st elevation Impression: abnormal ekg ____________________________________________    RADIOLOGY  CXR Chronic hyperinflation and coarse interstitial changes  ____________________________________________   PROCEDURES  Procedures  ____________________________________________   INITIAL IMPRESSION / ASSESSMENT AND PLAN / ED COURSE  Pertinent labs & imaging results that were available during my care of the patient were reviewed by me and considered in my medical decision making (see chart for details).   Patient presents to the emergency department today because of concern for dizziness as well as some chest pain.  Patient describes vertiginous type symptoms in terms of her dizziness.  She did get some benefit with her meclizine.  Did obtain MRI to evaluate for posterior CVA.  This fortunately was negative.  I do  think patient is likely suffering from vertigo.  I discussed this with the patient.  Patient also had secondary complaint of some chest pressure that is been on and off over the past couple of weeks.  Troponin was negative x2.  I did discuss with the patient that she could still be suffering from some heart issues and I would like her to follow-up with cardiology.  ___________________________________________   FINAL CLINICAL IMPRESSION(S) / ED DIAGNOSES  Final diagnoses:  Vertigo  Nonspecific chest pain     Note: This dictation was prepared with Dragon dictation. Any transcriptional errors that result from this process are unintentional     Nance Pear, MD 12/24/19 2322

## 2019-12-24 NOTE — ED Notes (Addendum)
Pt back from CT- pt assisted to bathroom.

## 2019-12-24 NOTE — Telephone Encounter (Signed)
FYI

## 2019-12-24 NOTE — ED Notes (Signed)
Pt states vertigo did not improve or change with Antivert. Pt transported to MRI at this time.

## 2019-12-24 NOTE — Telephone Encounter (Signed)
She called in c/o severe dizziness this morning when she first tried to get out of bed.   She was so dizzy she could not get up.  She had a headache and felt some nausea.   She also c/o being short of breath this morning.   She is still dizzy now but not nearly as bad.   She still has to hold onto things when she first gets up to ambulate so she doesn't fall.  She also c/o breaking out in a sweat this morning.   She called the EMTs and they came out however she refused transport to the ED this morning. She is c/o left shoulder pain that has been going on since this morning but is now having pressure in her chest as well.   "I feel totally drained".      I let her know she needed to go to the ED now.   I asked her to call 911 and let them take her however she said her niece lives across the street and she's going to call her to take pt to the ED now.    "My daughter is a Marine scientist and she is very upset with me for refusing to be taken to the ED this morning".    Pt mentioned she is for a COVID-19 test this evening at 3:00.   Her daughter was exposed and the pt has been exposed to her daughter.    Pt is not having symptoms.   I let pt know going to the ED for the left shoulder pain and chest pressure was more urgent and that they can test her for COVID in the ED.    I instructed her to let them know she had been exposed to [place] so they could take proper precautions.  She verbalized that she would and agreed to go on to the ED at [facility] now.  I sent my notes to Dr. Maralyn Sago office so he would be aware of the ED referral.    Reason for Disposition . SEVERE dizziness (vertigo) (e.g., unable to walk without assistance)  Answer Assessment - Initial Assessment Questions 1. DESCRIPTION: "Describe your dizziness."     I've had this happen a couple of times last year.   This morning when I sat up the room was spinning so fast and bad.   Every time I tried to sit up the room was  spinning.   I was dizzy going to the bathroom.   I had a headache.   The headache is gone now.   But I still have the dizziness when I sit up.    Bp 142/73  73 pulse.     2. VERTIGO: "Do you feel like either you or the room is spinning or tilting?"      The room is spinning bad.    I'm so dizzy when I stand up.    My shoulder on the left was hurting this morning.   I've been having shoulder pain in that arm but I've let it go.   It's been off and on for about a month now.    The pain is just in my left shoulder and I have a pressure in my chest.    3. LIGHTHEADED: "Do you feel lightheaded?" (e.g., somewhat faint, woozy, weak upon standing)     The nausea is gone.   I'm starting to have the shoulder and chest pain again. 4. SEVERITY: "How bad is it?"  "  Can you walk?"   - MILD - Feels unsteady but walking normally.   - MODERATE - Feels very unsteady when walking, but not falling; interferes with normal activities (e.g., school, work) .   - SEVERE - Unable to walk without falling (requires assistance).     4 on scale. 5. ONSET:  "When did the dizziness begin?"     This morning. 6. AGGRAVATING FACTORS: "Does anything make it worse?" (e.g., standing, change in head position)     Getting up and walking around.    I have to hold onto to things to walk but then I'm able to walk. I had the EMTs come out this morning.  They didn't say it was anything to worry about.    I run a normal low BP.    7. CAUSE: "What do you think is causing the dizziness?"     I don't have a clue.   I've never had anything like this.   I'm drained.   I'm so tired. 8. RECURRENT SYMPTOM: "Have you had dizziness before?" If so, ask: "When was the last time?" "What happened that time?"     Yes but nothing like this and with the left shoulder pain and chest pressure.    I was short of breath this morning. 9. OTHER SYMPTOMS: "Do you have any other symptoms?" (e.g., headache, weakness, numbness, vomiting, earache)     Nausea is gone now  but had it this morning.   Headache is off and on. 10. PREGNANCY: "Is there any chance you are pregnant?" "When was your last menstrual period?"       N/A due to age    I'm going for a COVID test at 3:00.  Protocols used: DIZZINESS - VERTIGO-A-AH

## 2019-12-27 ENCOUNTER — Encounter: Payer: Self-pay | Admitting: Family Medicine

## 2019-12-27 ENCOUNTER — Ambulatory Visit (INDEPENDENT_AMBULATORY_CARE_PROVIDER_SITE_OTHER): Payer: Medicare Other | Admitting: Family Medicine

## 2019-12-27 ENCOUNTER — Telehealth: Payer: Self-pay

## 2019-12-27 DIAGNOSIS — R42 Dizziness and giddiness: Secondary | ICD-10-CM

## 2019-12-27 DIAGNOSIS — M549 Dorsalgia, unspecified: Secondary | ICD-10-CM

## 2019-12-27 DIAGNOSIS — G8929 Other chronic pain: Secondary | ICD-10-CM

## 2019-12-27 DIAGNOSIS — K219 Gastro-esophageal reflux disease without esophagitis: Secondary | ICD-10-CM

## 2019-12-27 MED ORDER — NEXIUM 40 MG PO CPDR: 40.0000 mg | DELAYED_RELEASE_CAPSULE | Freq: Every day | ORAL | 4 refills | Status: DC

## 2019-12-27 MED ORDER — GABAPENTIN 300 MG PO CAPS: ORAL_CAPSULE | ORAL | 3 refills | Status: DC

## 2019-12-27 NOTE — Progress Notes (Signed)
Patient: Alejandra Little Female    DOB: Jun 04, 1956   64 y.o.   MRN: [id number] Visit Date: 12/27/2019  Today's Provider: Lelon Huh, MD   Chief Complaint  Patient presents with  . ER Follow Up   Subjective:    Virtual Visit via Telephone Note  I connected with Alejandra Little on 0000000 at  3:00 PM EST by telephone and verified that I am speaking with the correct person using two identifiers.  Location: Patient: home  Provider: bfp    I discussed the limitations, risks, security and privacy concerns of performing an evaluation and management service by telephone and the availability of in person appointments. I also discussed with the patient that there may be a patient responsible charge related to this service. The patient expressed understanding and agreed to proceed.   HPI  Follow up ER visit  Patient was seen in ER for dizziness and chest pain on 12/24/2019. She was treated for Vertigo. Treatment for this included Meclizine. Patient states she did not pick up RX, because it did not help in the ER. She reports good compliance with treatment. She reports this condition is Improved. Patient reports still having some dizziness. States that she had rapid onset of dizziness described as a room spinning sensation when she sat up in bed. No difficulty with balance. States continues to have spinning sensation the last couple of days but is gone.  ------------------------------------------------------------------------------------   No Known Allergies   Current Outpatient Medications:  .  ALPRAZolam (XANAX) 0.5 MG tablet, TAKE 1 TABLET BY MOUTH EVERYDAY AT BEDTIME (Patient taking differently: Take 0.5 mg by mouth at bedtime. ), Disp: 30 tablet, Rfl: 3 .  FLUoxetine (PROZAC) 20 MG capsule, TAKE 3 CAPSULES BY MOUTH EVERY DAY (Patient taking differently: Take 60 mg by mouth daily. ), Disp: 270 capsule, Rfl: 4 .  gabapentin (NEURONTIN) 300 MG capsule, TAKE ONE CAPSULE BY  MOUTH 3 TIMES A DAY AND TAKE 2 CAPSULES BY MOUTH AT BEDTIME (Patient taking differently: Take 300-600 mg by mouth See admin instructions. Take 1 capsule (300mg ) by mouth three times daily and take 2 capsules (600mg ) by mouth at bedtime), Disp: 450 capsule, Rfl: 4 .  HYDROcodone-acetaminophen (NORCO) 7.5-325 MG tablet, Take 1 tablet by mouth 4 (four) times daily as needed for moderate pain., Disp: 120 tablet, Rfl: 0 .  NEXIUM 40 MG capsule, Take 1 capsule (40 mg total) by mouth daily., Disp: 90 capsule, Rfl: 4 .  pravastatin (PRAVACHOL) 40 MG tablet, TAKE 1 TABLET BY MOUTH AT BEDTIME (Patient taking differently: Take 40 mg by mouth at bedtime. ), Disp: 90 tablet, Rfl: 4 .  Specialty Vitamins Products ([facility] HAIR/SKIN/NAILS) TABS, Take 1 tablet by mouth as directed., Disp: , Rfl:  .  meclizine (ANTIVERT) 25 MG tablet, Take 1 tablet (25 mg total) by mouth 3 (three) times daily as needed for dizziness. (Patient not taking: Reported on 12/27/2019), Disp: 30 tablet, Rfl: 0  Review of Systems  Constitutional: Negative for appetite change, chills, fatigue and fever.  HENT: Positive for ear pain (left ear).   Respiratory: Negative for chest tightness and shortness of breath.   Cardiovascular: Negative for chest pain and palpitations.  Gastrointestinal: Positive for nausea. Negative for abdominal pain and vomiting.  Neurological: Positive for dizziness (feels like the room is spinning). Negative for weakness.    Social History   Tobacco Use  . Smoking status: Former Smoker    Packs/day: 1.00  Years: 24.00    Pack years: 24.00    Types: Cigarettes    Start date: 63    Quit date: 11/08/2015    Years since quitting: 4.1  . Smokeless tobacco: Never Used  Substance Use Topics  . Alcohol use: No      Objective:   There were no vitals taken for this visit. There were no vitals filed for this visit.There is no height or weight on file to calculate BMI.   Physical Exam  Awake, alert, oriented x  3. In no apparent distress  No results found for any visits on 12/27/19.     Assessment & Plan     1. Vertigo Now resolved. Counseled to call if she has recurrent sx.   2. Back pain, chronic refill- gabapentin (NEURONTIN) 300 MG capsule; Take 1 capsule (300mg ) by mouth three times daily and take 2 capsules (600mg ) by mouth at bedtime  Dispense: 450 capsule; Refill: 3  3. Gastroesophageal reflux disease without esophagitis refill- NEXIUM 40 MG capsule; Take 1 capsule (40 mg total) by mouth daily.  Dispense: 90 capsule; Refill: 4  The entirety of the information documented in the History of Present Illness, Review of Systems and Physical Exam were personally obtained by me. Portions of this information were initially documented by Meyer Cory, CMA and reviewed by me for thoroughness and accuracy.      Lelon Huh, MD  [facility]

## 2019-12-27 NOTE — Telephone Encounter (Signed)
Copied from [place]. Topic: General - Other >> Dec 27, 2019  8:50 AM Alanda Slim E wrote: Reason for CRM: Pt would like a call from Dr. Maralyn Sago nurse to follow up on Vertigo/ please advise

## 2019-12-31 ENCOUNTER — Telehealth: Payer: Self-pay | Admitting: Family Medicine

## 2019-12-31 NOTE — Telephone Encounter (Signed)
BP is running high at 172/72 today.  She said she doesn't feel right, feels like her head is in a fog and doesn't feel good at all.   She is not dizzy at all.

## 2019-12-31 NOTE — Telephone Encounter (Signed)
I called and spoke with patient. She states she feels much better now. She rechecked her blood pressure and it was down to 122/67. Patient states she was stressed out earlier today with trying to get things taken care of around her house. Patient states after talking to Arbie Cookey, she sat down and relaxed and began to feel better. Patient denied having any symptoms while I was on the phone with her. I advised patient to continue monitoring her blood pressure at home watch for any readings that are as high as 140/90.  I advised her to go to the ER if she had any elevated blood pressure readings and/ or chest pain, shortness of breath, numbness, blurred vision, severe headache and dizziness. Patient verbalized understanding.

## 2020-01-02 ENCOUNTER — Ambulatory Visit: Payer: Medicare Other | Attending: Internal Medicine

## 2020-01-02 DIAGNOSIS — Z23 Encounter for immunization: Secondary | ICD-10-CM | POA: Insufficient documentation

## 2020-01-02 NOTE — Progress Notes (Signed)
   Covid-19 Vaccination Clinic  Name:  Alejandra Little    MRN: [id number] DOB: 31-Jul-1956  01/02/2020  [name] was observed post Covid-19 immunization for 15 minutes without incidence. She was provided with Vaccine Information Sheet and instruction to access the V-Safe system.   [name] was instructed to call 911 with any severe reactions post vaccine: Marland Kitchen Difficulty breathing  . Swelling of your face and throat  . A fast heartbeat  . A bad rash all over your body  . Dizziness and weakness    Immunizations Administered    Name Date Dose VIS Date Route   Pfizer COVID-19 Vaccine 01/02/2020 11:04 AM 0.3 mL 10/29/2019 Intramuscular   Manufacturer: [place]   Lot: [id number]   [place]: [id number]

## 2020-01-08 ENCOUNTER — Other Ambulatory Visit: Payer: Self-pay | Admitting: Family Medicine

## 2020-01-08 DIAGNOSIS — E785 Hyperlipidemia, unspecified: Secondary | ICD-10-CM

## 2020-01-09 NOTE — Telephone Encounter (Signed)
Requesting refill too soon. Last ordered on 01/01/19. Refills through May '21.

## 2020-02-01 ENCOUNTER — Ambulatory Visit: Payer: Medicare Other | Attending: Internal Medicine

## 2020-02-01 DIAGNOSIS — Z23 Encounter for immunization: Secondary | ICD-10-CM

## 2020-02-01 NOTE — Progress Notes (Signed)
   Covid-19 Vaccination Clinic  Name:  Alejandra Little    MRN: [id number] DOB: [birth year]  02/01/2020  [name] was observed post Covid-19 immunization for 15 minutes without incident. She was provided with Vaccine Information Sheet and instruction to access the V-Safe system.   [name] was instructed to call 911 with any severe reactions post vaccine: Marland Kitchen Difficulty breathing  . Swelling of face and throat  . A fast heartbeat  . A bad rash all over body  . Dizziness and weakness   Immunizations Administered    Name Date Dose VIS Date Route   Pfizer COVID-19 Vaccine 02/01/2020 12:21 PM 0.3 mL 10/29/2019 Intramuscular   Manufacturer: [place]   Lot: [id number]   [place]: [id number]

## 2020-02-05 ENCOUNTER — Other Ambulatory Visit: Payer: Self-pay | Admitting: Family Medicine

## 2020-02-05 DIAGNOSIS — E785 Hyperlipidemia, unspecified: Secondary | ICD-10-CM

## 2020-02-05 NOTE — Telephone Encounter (Signed)
Requested Prescriptions  Pending Prescriptions Disp Refills  . pravastatin (PRAVACHOL) 40 MG tablet [Pharmacy Med Name: PRAVASTATIN SODIUM 40 MG TAB] 90 tablet 4    Sig: TAKE 1 TABLET BY MOUTH EVERYDAY AT BEDTIME     Cardiovascular:  Antilipid - Statins Failed - 02/05/2020 10:16 PM      Failed - LDL in normal range and within 360 days    LDL Calculated  Date Value Ref Range Status  05/24/2019 101 (H) 0 - 99 mg/dL Final         Failed - Triglycerides in normal range and within 360 days    Triglycerides  Date Value Ref Range Status  05/24/2019 182 (H) 0 - 149 mg/dL Final         Passed - Total Cholesterol in normal range and within 360 days    Cholesterol, Total  Date Value Ref Range Status  05/24/2019 184 100 - 199 mg/dL Final         Passed - HDL in normal range and within 360 days    HDL  Date Value Ref Range Status  05/24/2019 47 >39 mg/dL Final         Passed - Patient is not pregnant      Passed - Valid encounter within last 12 months    Recent Outpatient Visits          1 month ago [place], Donald E, MD   2 months ago [place], Donald E, MD   8 months ago Pure hypercholesterolemia   [facility] [name], MD   1 year ago Arthralgia of both hands   [facility] [name], MD   1 year ago Pelvic pain in female   [place], Kirstie Peri, MD      Future Appointments            In 3 months  [facility], [place]   In 3 months [place], Kirstie Peri, MD [facility], [place]

## 2020-02-07 ENCOUNTER — Other Ambulatory Visit: Payer: Self-pay | Admitting: Family Medicine

## 2020-02-07 MED ORDER — HYDROCODONE-ACETAMINOPHEN 7.5-325 MG PO TABS: 1.0000 | ORAL_TABLET | Freq: Four times a day (QID) | ORAL | 0 refills | Status: DC | PRN

## 2020-02-07 NOTE — Telephone Encounter (Signed)
Copied from [place] #[id number]. Topic: Quick Communication - Rx Refill/Question >> Feb 07, 2020 10:47 AM Rainey Pines A wrote: Medication: HYDROcodone-acetaminophen (NORCO) 7.5-325 MG tablet   Has the patient contacted their pharmacy?Yes (Agent: If no, request that the patient contact the pharmacy for the refill.) (Agent: If yes, when and what did the pharmacy advise?)Contact PCP  Preferred Pharmacy (with phone number or street name[id number] [place], [place] - [address]  Phone:  [phone] Fax:  [phone]     Agent: Please be advised that RX refills may take up to 3 business days. We ask that you follow-up with your pharmacy.

## 2020-02-07 NOTE — Telephone Encounter (Signed)
Requested medication (s) are due for refill today -yes  Requested medication (s) are on the active medication list -yes  Future visit scheduled -yes  Last refill: 12/17/19  Notes to clinic: Patient requesting non delegated Rx  Requested Prescriptions  Pending Prescriptions Disp Refills   HYDROcodone-acetaminophen (NORCO) 7.5-325 MG tablet 120 tablet 0    Sig: Take 1 tablet by mouth 4 (four) times daily as needed for moderate pain.      Not Delegated - Analgesics:  Opioid Agonist Combinations Failed - 02/07/2020 10:52 AM      Failed - This refill cannot be delegated      Failed - Urine Drug Screen completed in last 360 days.      Passed - Valid encounter within last 6 months    Recent Outpatient Visits           1 month ago [place] [name], MD   2 months ago [place], Donald E, MD   8 months ago Pure hypercholesterolemia   [facility] [name], MD   1 year ago Arthralgia of both hands   [facility] [name], MD   1 year ago Pelvic pain in female   [facility] [name], MD       Future Appointments             In 3 months  [facility], [place]   In 3 months [place], Kirstie Peri, MD [facility], [facility]                Requested Prescriptions  Pending Prescriptions Disp Refills   HYDROcodone-acetaminophen (NORCO) 7.5-325 MG tablet 120 tablet 0    Sig: Take 1 tablet by mouth 4 (four) times daily as needed for moderate pain.      Not Delegated - Analgesics:  Opioid Agonist Combinations Failed - 02/07/2020 10:52 AM      Failed - This refill cannot be delegated      Failed - Urine Drug Screen completed in last 360 days.      Passed - Valid encounter within last 6 months    Recent Outpatient Visits           1 month ago [place], Donald E, MD   2 months ago [place], Donald E, MD   8 months ago Pure hypercholesterolemia   [facility] [name], MD   1 year ago Arthralgia of both hands   [facility] [name], MD   1 year ago Pelvic pain in female   [place], Kirstie Peri, MD       Future Appointments             In 3 months  [facility], [place]   In 3 months Fisher, Kirstie Peri, MD [facility], [place]

## 2020-03-14 ENCOUNTER — Other Ambulatory Visit: Payer: Self-pay | Admitting: Family Medicine

## 2020-03-14 DIAGNOSIS — K219 Gastro-esophageal reflux disease without esophagitis: Secondary | ICD-10-CM

## 2020-03-14 MED ORDER — HYDROCODONE-ACETAMINOPHEN 7.5-325 MG PO TABS: 1.0000 | ORAL_TABLET | Freq: Four times a day (QID) | ORAL | 0 refills | Status: DC | PRN

## 2020-03-14 NOTE — Telephone Encounter (Signed)
   Notes to clinic:  Alternative Requested:BRAND DRUG NOT ON FORMULARY. WOULD YOU PLEASE GET PRIOR AUTH? THANKS   Requested Prescriptions  Pending Prescriptions Disp Refills   esomeprazole (NEXIUM) 20 MG capsule [Pharmacy Med Name: ESOMEPRAZOLE MAG DR 20 MG CAP]  0    Sig: Please specify directions, refills and quantity      Gastroenterology: Proton Pump Inhibitors Passed - 03/14/2020  8:41 AM      Passed - Valid encounter within last 12 months    Recent Outpatient Visits           2 months ago [place], Donald E, MD   3 months ago [place], Donald E, MD   9 months ago Pure hypercholesterolemia   [facility] [name], MD   1 year ago Arthralgia of both hands   [facility] [name], MD   1 year ago Pelvic pain in female   [place], Kirstie Peri, MD       Future Appointments             In 1 month  [place], [place]   In 2 months Fisher, Kirstie Peri, MD [facility], [place]

## 2020-03-14 NOTE — Telephone Encounter (Signed)
Requested medication (s) are due for refill today: yes  Requested medication (s) are on the active medication list: yes  Last refill: 02/07/2020  Future visit scheduled: yes  Notes to clinic:  this refill cannot be delegated  [facility]   Requested Prescriptions  Pending Prescriptions Disp Refills   HYDROcodone-acetaminophen (NORCO) 7.5-325 MG tablet 120 tablet 0    Sig: Take 1 tablet by mouth 4 (four) times daily as needed for moderate pain.      Not Delegated - Analgesics:  Opioid Agonist Combinations Failed - 03/14/2020  9:19 AM      Failed - This refill cannot be delegated      Failed - Urine Drug Screen completed in last 360 days.      Passed - Valid encounter within last 6 months    Recent Outpatient Visits           2 months ago [place], Donald E, MD   3 months ago [place], Donald E, MD   9 months ago Pure hypercholesterolemia   [facility] [name], MD   1 year ago Arthralgia of both hands   [facility] [name], MD   1 year ago Pelvic pain in female   [place], Kirstie Peri, MD       Future Appointments             In 1 month  [place], [place]   In 2 months Fisher, Kirstie Peri, MD [facility], [place]

## 2020-03-14 NOTE — Telephone Encounter (Signed)
Medication Refill - Medication: Hydrocodone 7.5/325  Has the patient contacted their pharmacy? No. (Agent: If no, request that the patient contact the pharmacy for the refill.) (Agent: If yes, when and what did the pharmacy advise?)  Preferred Pharmacy (with phone number [facility] street name[facility] [place]  Agent: Please be advised that RX refills may take up to 3 business days. We ask that you follow-up with your pharmacy.

## 2020-03-17 ENCOUNTER — Telehealth: Payer: Self-pay | Admitting: Family Medicine

## 2020-03-17 NOTE — Telephone Encounter (Signed)
Patient states she will contact pharmacy and request they fax a PA form to be completed.

## 2020-03-17 NOTE — Telephone Encounter (Signed)
Pt stated she needs a PA for NEXIUM 40 MG capsule So insurance will cover it. Stated it has to be done every year. Please advise.

## 2020-03-17 NOTE — Telephone Encounter (Signed)
Pharmacy stated  To Pt they have sent several this week and sent another PA form this morning but they will send another to the office now.

## 2020-03-22 MED ORDER — ESOMEPRAZOLE MAGNESIUM 40 MG PO CPDR: 40.0000 mg | DELAYED_RELEASE_CAPSULE | Freq: Every day | ORAL | 0 refills | Status: DC

## 2020-03-22 NOTE — Telephone Encounter (Signed)
Pt called about the PA for NEXIUM 40 MG capsule /she states that her reflux is bothering her and wanted to see if dr. Caryn Section had any luck with the PA needed / please advise asa[

## 2020-03-22 NOTE — Telephone Encounter (Signed)
Her insurance won't cover brand name  Nexium without another trial of esomeprazole 40mg  . Recommend she try that. If that is not effective, her insurance company requires another office visit to document that the esomeprazole doesn't work. Then we have to upload the medical record to CoverMyMeds for them to review before they will cover it.

## 2020-03-22 NOTE — Telephone Encounter (Signed)
Attempted to contact patient, no answer   Left a voicemail. Okay for [facility] to advise patient.

## 2020-03-23 ENCOUNTER — Other Ambulatory Visit: Payer: Self-pay | Admitting: Family Medicine

## 2020-03-23 DIAGNOSIS — K219 Gastro-esophageal reflux disease without esophagitis: Secondary | ICD-10-CM

## 2020-03-24 NOTE — Telephone Encounter (Signed)
Pt called in and would like to check the status on the PA for the [place] in [place]

## 2020-03-24 NOTE — Telephone Encounter (Signed)
Patient advised insurance is requiring for her to try generic Nexium.

## 2020-04-07 ENCOUNTER — Other Ambulatory Visit: Payer: Self-pay | Admitting: Family Medicine

## 2020-04-07 NOTE — Telephone Encounter (Signed)
Medication Refill - Medication: Alprazolam and prior auth for Nexium. Patient has tried the generic but she needs the brand because the generic is making her sick on the stomach. Her insurance will not pay for hte brand without prior auth.  Has the patient contacted their pharmacy? Yes.   (Agent: If no, request that the patient contact the pharmacy for the refill.) (Agent: If yes, when and what did the pharmacy advise?) The pharmacy told her she needs a prior auth. For the brand.  Preferred Pharmacy (with phone number or street [facility] [address] [place] [phone] Agent: Please be advised that RX refills may take up to 3 business days. We ask that you follow-up with your pharmacy.

## 2020-04-11 ENCOUNTER — Other Ambulatory Visit: Payer: Self-pay | Admitting: Family Medicine

## 2020-04-11 DIAGNOSIS — F419 Anxiety disorder, unspecified: Secondary | ICD-10-CM

## 2020-04-11 DIAGNOSIS — K219 Gastro-esophageal reflux disease without esophagitis: Secondary | ICD-10-CM

## 2020-04-11 MED ORDER — HYDROCODONE-ACETAMINOPHEN 7.5-325 MG PO TABS: 1.0000 | ORAL_TABLET | Freq: Four times a day (QID) | ORAL | 0 refills | Status: DC | PRN

## 2020-04-11 MED ORDER — ALPRAZOLAM 0.5 MG PO TABS: 0.5000 mg | ORAL_TABLET | Freq: Every day | ORAL | 5 refills | Status: DC

## 2020-04-11 MED ORDER — NEXIUM 40 MG PO CPDR: 40.0000 mg | DELAYED_RELEASE_CAPSULE | Freq: Every day | ORAL | 4 refills | Status: DC

## 2020-04-11 NOTE — Telephone Encounter (Signed)
Left patient a voicemail advising her as stated below. 

## 2020-04-11 NOTE — Telephone Encounter (Signed)
Pt called back in to follow up on refill request for Alprazolam. Pt says that she is now out of her medication and would like to have sent in to pharmacy.      ALSO, pt would like a status on Rx for Nexium?    Please advise.

## 2020-04-11 NOTE — Telephone Encounter (Signed)
Have renewed PA request for Nexium on CoverMyMeds. [facility]olam sent [facility]

## 2020-04-11 NOTE — Telephone Encounter (Signed)
Pt is requesting a refill for HYDROcodone-acetaminophen [facility]) 7.5-325 MG tablet     [id number] [place], [place] - [address]  [address] [place], [place] [address]

## 2020-04-11 NOTE — Telephone Encounter (Signed)
Requested medication (s) are due for refill today: yes  Requested medication (s) are on the active medication list: yes  Last refill:  03/14/20  #120  0 refills  Future visit scheduled:yes  Notes to clinic: medication not delegated    Requested Prescriptions  Pending Prescriptions Disp Refills   HYDROcodone-acetaminophen (NORCO) 7.5-325 MG tablet 120 tablet 0    Sig: Take 1 tablet by mouth 4 (four) times daily as needed for moderate pain.      Not Delegated - Analgesics:  Opioid Agonist Combinations Failed - 04/11/2020 11:24 AM      Failed - This refill cannot be delegated      Failed - Urine Drug Screen completed in last 360 days.      Passed - Valid encounter within last 6 months    Recent Outpatient Visits           3 months ago [place], Donald E, MD   4 months ago [place], Donald E, MD   10 months ago Pure hypercholesterolemia   [facility] [name], MD   1 year ago Arthralgia of both hands   [facility] [name], MD   1 year ago Pelvic pain in female   [place], Kirstie Peri, MD       Future Appointments             In 3 weeks  [facility], [place]   In 1 month Fisher, Kirstie Peri, MD [facility], [place]

## 2020-04-25 ENCOUNTER — Telehealth: Payer: Self-pay

## 2020-04-25 MED ORDER — DEXLANSOPRAZOLE 60 MG PO CPDR: 60.0000 mg | DELAYED_RELEASE_CAPSULE | Freq: Every day | ORAL | 3 refills | Status: DC

## 2020-04-25 NOTE — Telephone Encounter (Signed)
Copied from [place] [phone]. Topic: General - Other >> Apr 25, 2020  9:33 AM Rainey Pines A wrote: Patient stated that her insurance will not except the generic medication for nexium. Patient is wanting an alternative called in for her and to speak with Dr. Sabino Snipes nurse about this. Please advise

## 2020-04-25 NOTE — Telephone Encounter (Signed)
Have sent prescription for dexlansoprazole which is stronger than generic nexium

## 2020-04-25 NOTE — Telephone Encounter (Signed)
Patient advised as below. Patient verbalizes understanding and is in agreement with treatment plan.  

## 2020-05-04 NOTE — Progress Notes (Signed)
Subjective:   Alejandra Little is a 64 y.o. female who presents for Medicare Annual (Subsequent) preventive examination.  I connected with Jesse Fall today by telephone and verified that I am speaking with the correct person using two identifiers. Location patient: home Location provider: work Persons participating in the virtual visit: patient, provider.   I discussed the limitations, risks, security and privacy concerns of performing an evaluation and management service by telephone and the availability of in person appointments. I also discussed with the patient that there may be a patient responsible charge related to this service. The patient expressed understanding and verbally consented to this telephonic visit.    Interactive audio and video telecommunications were attempted between this provider and patient, however failed, due to patient having technical difficulties OR patient did not have access to video capability.  We continued and completed visit with audio only.  Review of Systems:  N/A  Cardiac Risk Factors include: advanced age (>14men, >53 women);dyslipidemia     Objective:     Vitals: There were no vitals taken for this visit.  There is no height or weight on file to calculate BMI.  Advanced Directives 05/08/2020 12/24/2019 05/05/2019 04/30/2018 03/18/2017 02/28/2016 12/01/2015  Does Patient Have a Medical Advance Directive? No No No No No No No  Would patient like information on creating a medical advance directive? No - Patient declined No - Patient declined No - Patient declined - No - Patient declined - -    Tobacco Social History   Tobacco Use  Smoking Status Former Smoker  . Packs/day: 1.00  . Years: 24.00  . Pack years: 24.00  . Types: Cigarettes  . Start date: 53  . Quit date: 11/08/2015  . Years since quitting: 4.5  Smokeless Tobacco Never Used     Counseling given: Not Answered   Clinical Intake:  Pre-visit preparation completed: Yes  Pain :  0-10 Pain Score: 7  Pain Type: Chronic pain Pain Location: Back (and left hip) Pain Orientation: Lower Pain Descriptors / Indicators: Aching Pain Frequency: Constant Pain Relieving Factors: Pt takes Hydrocodone for pain.  Pain Relieving Factors: Pt takes Hydrocodone for pain.  Nutritional Risks: None Diabetes: No  How often do you need to have someone help you when you read instructions, pamphlets, or other written materials from your doctor or pharmacy?: 1 - Never  Interpreter Needed?: No  Information entered by :: [facility], LPN  Past Medical History:  Diagnosis Date  . Anxiety   . Arthritis   . Chronic back pain   . Dog tapeworm infection 01/11/2016  . GERD (gastroesophageal reflux disease)   . Giardia 01/11/2016  . Hyperlipidemia    Past Surgical History:  Procedure Laterality Date  . ABDOMINAL HYSTERECTOMY  1999   Fibroids  . CHOLECYSTECTOMY  2004  . LITHOTRIPSY     Family History  Problem Relation Age of Onset  . Lung cancer Mother   . Lung cancer Father   . Hypertension Sister   . COPD Sister   . COPD Sister    Social History   Socioeconomic History  . Marital status: Divorced    Spouse name: Not on file  . Number of children: 3  . Years of education: 8  . Highest education level: 8th grade  Occupational History  . Occupation: Disability  Tobacco Use  . Smoking status: Former Smoker    Packs/day: 1.00    Years: 24.00    Pack years: 24.00    Types: Cigarettes  Start date: 66    Quit date: 11/08/2015    Years since quitting: 4.5  . Smokeless tobacco: Never Used  Vaping Use  . Vaping Use: Never used  Substance and Sexual Activity  . Alcohol use: No  . Drug use: Yes    Comment: prescribed hydrocodone   . Sexual activity: Yes  Other Topics Concern  . Not on file  Social History Narrative  . Not on file   Social Determinants of Health   Financial Resource Strain: Low Risk   . Difficulty of Paying Living Expenses: Not hard at all    Food Insecurity: No Food Insecurity  . Worried About Charity fundraiser in the Last Year: Never true  . Ran Out of Food in the Last Year: Never true  Transportation Needs: No Transportation Needs  . Lack of Transportation (Medical): No  . Lack of Transportation (Non-Medical): No  Physical Activity: Inactive  . Days of Exercise per Week: 0 days  . Minutes of Exercise per Session: 0 min  Stress: No Stress Concern Present  . Feeling of Stress : Not at all  Social Connections: Socially Isolated  . Frequency of Communication with Friends and Family: More than three times a week  . Frequency of Social Gatherings with Friends and Family: More than three times a week  . Attends Religious Services: Never  . Active Member of Clubs or Organizations: No  . Attends Archivist Meetings: Never  . Marital Status: Divorced    Outpatient Encounter Medications as of 05/08/2020  Medication Sig  . ALPRAZolam (XANAX) 0.5 MG tablet Take 1 tablet (0.5 mg total) by mouth at bedtime.  Marland Kitchen dexlansoprazole (DEXILANT) 60 MG capsule Take 1 capsule (60 mg total) by mouth daily.  Marland Kitchen FLUoxetine (PROZAC) 20 MG capsule TAKE 3 CAPSULES BY MOUTH EVERY DAY (Patient taking differently: Take 60 mg by mouth daily. )  . gabapentin (NEURONTIN) 300 MG capsule Take 1 capsule (300mg ) by mouth three times daily and take 2 capsules (600mg ) by mouth at bedtime  . HYDROcodone-acetaminophen (NORCO) 7.5-325 MG tablet Take 1 tablet by mouth 4 (four) times daily as needed for moderate pain.  . pravastatin (PRAVACHOL) 40 MG tablet TAKE 1 TABLET BY MOUTH EVERYDAY AT BEDTIME  . Specialty Vitamins Products ([employer] HAIR/SKIN/NAILS) TABS Take 1 tablet by mouth daily.   . meclizine (ANTIVERT) 25 MG tablet Take 1 tablet (25 mg total) by mouth 3 (three) times daily as needed for dizziness. (Patient not taking: Reported on 12/27/2019)   No facility-administered encounter medications on file as of 05/08/2020.    Activities of Daily Living In  your present state of health, do you have any difficulty performing the following activities: 05/08/2020  Hearing? N  Vision? N  Comment Wears eye glasses.  Difficulty concentrating or making decisions? N  Walking or climbing stairs? Y  Comment Occasionally due to back pains.  Dressing or bathing? N  Doing errands, shopping? N  Preparing Food and eating ? N  Using the Toilet? N  In the past six months, have you accidently leaked urine? Y  Comment Occasionally with urgency.  Do you have problems with loss of bowel control? N  Managing your Medications? N  Managing your Finances? N  Housekeeping or managing your Housekeeping? N  Some recent data might be hidden    Patient Care Team: [name], MD as PCP - General (Family Medicine)    Assessment:   This is a routine wellness examination for Alejandra Little.  Exercise Activities and Dietary recommendations Current Exercise Habits: The patient does not participate in regular exercise at present, Exercise limited by: orthopedic condition(s)  Goals    . Cut out extra servings     Recommend to cut out all junk food when snacking and replace with fruit and vegetables.     . Increase water intake     Recommend increasing water intake to 3 glasses a day.    Marland Kitchen LIFESTYLE - DECREASE FALLS RISK     Recommend to remove any items from the home that may cause slips or trips.       Fall Risk: Fall Risk  05/08/2020 05/05/2019 04/30/2018 03/18/2017 02/28/2016  Falls in the past year? 1 0 No No Yes  Comment accidental fall due to flip flop - - - -  Number falls in past yr: 0 - - - 1  Injury with Fall? 0 - - - No  Risk for fall due to : - - - - Medication side effect  Follow up Falls prevention discussed - - - Falls evaluation completed    FALL RISK PREVENTION PERTAINING TO THE HOME:  Any stairs in or around the home? Yes  If so, are there any without handrails? No   Home free of loose throw rugs in walkways, pet beds, electrical cords, etc?  Yes  Adequate lighting in your home to reduce risk of falls? Yes   ASSISTIVE DEVICES UTILIZED TO PREVENT FALLS:  Life alert? No  Use of a cane, walker or w/c? No  Grab bars in the bathroom? No  Shower chair or bench in shower? No  Elevated toilet seat or a handicapped toilet? No    TIMED UP AND GO:  Was the test performed? No .    Depression Screen PHQ 2/9 Scores 05/08/2020 05/05/2019 04/30/2018 04/24/2018  PHQ - 2 Score 2 3 4 4   PHQ- 9 Score 6 7 8 10      Cognitive Function     6CIT Screen 05/08/2020 05/05/2019 04/30/2018 03/18/2017  What Year? 0 points 0 points 0 points 0 points  What month? 0 points 0 points 3 points 0 points  What time? 0 points 0 points 0 points 0 points  Count back from 20 0 points 0 points 0 points 0 points  Months in reverse 0 points 0 points 0 points 0 points  Repeat phrase 0 points 0 points 0 points 0 points  Total Score 0 0 3 0    Immunization History  Administered Date(s) Administered  . Influenza Split 08/03/2012  . Influenza,inj,Quad PF,6+ Mos 08/23/2013, 09/05/2014, 09/07/2015, 09/03/2018  . Influenza-Unspecified 09/23/2017, 08/26/2019  . PFIZER SARS-COV-2 Vaccination 01/02/2020, 02/01/2020  . Tdap 09/07/2015    Qualifies for Shingles Vaccine? Yes . Due for Shingrix. Pt has been advised to call insurance company to determine out of pocket expense. Advised may also receive vaccine at local pharmacy or Health Dept. Verbalized acceptance and understanding.  Tdap: Up to date  Flu Vaccine: Up to date  Pneumococcal Vaccine: Due for Pneumococcal vaccine. Does the patient want to receive this vaccine today?  No . Advised may receive this vaccine at local pharmacy or Health Dept. Aware to provide a copy of the vaccination record if obtained from local pharmacy or Health Dept. Verbalized acceptance and understanding.   Screening Tests Health Maintenance  Topic Date Due  . DEXA SCAN  04/05/2011  . COLONOSCOPY  12/14/2017  . PNA vac Low Risk Adult (1  of 2 - PCV13) Never done  .  MAMMOGRAM  02/21/2020  . INFLUENZA VACCINE  06/18/2020  . TETANUS/TDAP  09/06/2025  . COVID-19 Vaccine  Completed  . Hepatitis C Screening  Completed  . HIV Screening  Completed  . PAP SMEAR-Modifier  Discontinued    Cancer Screenings:  Colorectal Screening: Completed 12/14/12. Repeat every 5 years. Referral to GI placed today. Pt aware the office will call re: appt.  Mammogram: Completed 02/20/18. Repeat every 1-2 years as advised. Ordered today. Pt provided with contact info and advised to call to schedule appt.   Bone Density: Completed 04/04/06. Results reflect OSTEOPENIA. Repeat every 5 years. Ordered today. Pt aware the office will call re: appt.  Lung Cancer Screening: (Low Dose CT Chest recommended if Age 31-80 years, 30 pack-year currently smoking OR have quit w/in 15years.) does qualify however completed 12/24/19. Repeat yearly.  Additional Screening:  Hepatitis C Screening: Up to date  Vision Screening: Recommended annual ophthalmology exams for early detection of glaucoma and other disorders of the eye.  Dental Screening: Recommended annual dental exams for proper oral hygiene  Community Resource Referral:  CRR required this visit? No     Plan:  I have personally reviewed and addressed the Medicare Annual Wellness questionnaire and have noted the following in the patient's chart:  A. Medical and social history B. Use of alcohol, tobacco or illicit drugs  C. Current medications and supplements D. Functional ability and status E.  Nutritional status F.  Physical activity G. Advance directives H. List of other physicians I.  Hospitalizations, surgeries, and ER visits in previous 12 months J.  [place] such as hearing and vision if needed, cognitive and depression L. Referrals and appointments   In addition, I have reviewed and discussed with patient certain preventive protocols, quality metrics, and best practice  recommendations. A written personalized care plan for preventive services as well as general preventive health recommendations were provided to patient. Nurse Health Advisor  Signed,    Salmaan Patchin [place], [place]  [phone]/[id number] Nurse Health Advisor   Nurse Notes: Pt would like to receive the pneumonia vaccine at next in office apt.

## 2020-05-08 ENCOUNTER — Telehealth: Payer: Self-pay

## 2020-05-08 ENCOUNTER — Other Ambulatory Visit: Payer: Self-pay

## 2020-05-08 ENCOUNTER — Ambulatory Visit (INDEPENDENT_AMBULATORY_CARE_PROVIDER_SITE_OTHER): Payer: Medicare Other

## 2020-05-08 DIAGNOSIS — Z860100 Personal history of colon polyps, unspecified: Secondary | ICD-10-CM

## 2020-05-08 DIAGNOSIS — Z1211 Encounter for screening for malignant neoplasm of colon: Secondary | ICD-10-CM | POA: Diagnosis not present

## 2020-05-08 DIAGNOSIS — E2839 Other primary ovarian failure: Secondary | ICD-10-CM | POA: Diagnosis not present

## 2020-05-08 DIAGNOSIS — Z0000 Encounter for general adult medical examination without abnormal findings: Secondary | ICD-10-CM

## 2020-05-08 DIAGNOSIS — Z1231 Encounter for screening mammogram for malignant neoplasm of breast: Secondary | ICD-10-CM | POA: Diagnosis not present

## 2020-05-08 NOTE — Patient Instructions (Signed)
Alejandra Little , Thank you for taking time to come for your Medicare Wellness Visit. I appreciate your ongoing commitment to your health goals. Please review the following plan we discussed and let me know if I can assist you in the future.   Screening recommendations/referrals: Colonoscopy: Currently due. Referral to GI placed today. Pt aware the office will call re: appt. Mammogram: Currently due. Ordered today. Pt provided with contact info and advised to call to schedule appt.  Bone Density: Currently due. Ordered today. Pt aware the office will call re: appt. Recommended yearly ophthalmology/optometry visit for glaucoma screening and checkup Recommended yearly dental visit for hygiene and checkup  Vaccinations: Influenza vaccine: Up to date Pneumococcal vaccine: Currently due. Will receive at next in office apt.  Tdap vaccine: Up to date, due 08/2025 Shingles vaccine: Currently due. Pt declines today.     Advanced directives: Advance directive discussed with you today. Even though you declined this today please call our office should you change your mind and we can give you the proper paperwork for you to fill out.  Conditions/risks identified: Fall risk prevention discussed today. Recommend increasing water intake to 6-8 8 oz glasses a day and also increase fruit and vegetable intake in daily diet.   Next appointment: 05/29/20 @ 11:00 AM with Dr Caryn Section. Declined scheduling an AWV for 2022 at this time.   Preventive Care 66 Years and Older, Female Preventive care refers to lifestyle choices and visits with your health care provider that can promote health and wellness. What does preventive care include?  A yearly physical exam. This is also called an annual well check.  Dental exams once or twice a year.  Routine eye exams. Ask your health care provider how often you should have your eyes checked.  Personal lifestyle choices, including:  Daily care of your teeth and  gums.  Regular physical activity.  Eating a healthy diet.  Avoiding tobacco and drug use.  Limiting alcohol use.  Practicing safe sex.  Taking low-dose aspirin every day.  Taking vitamin and mineral supplements as recommended by your health care provider. What happens during an annual well check? The services and screenings done by your health care provider during your annual well check will depend on your age, overall health, lifestyle risk factors, and family history of disease. Counseling  Your health care provider may ask you questions about your:  Alcohol use.  Tobacco use.  Drug use.  Emotional well-being.  Home and relationship well-being.  Sexual activity.  Eating habits.  History of falls.  Memory and ability to understand (cognition).  Work and work Statistician.  Reproductive health. Screening  You may have the following tests or measurements:  Height, weight, and BMI.  Blood pressure.  Lipid and cholesterol levels. These may be checked every 5 years, or more frequently if you are over 42 years old.  Skin check.  Lung cancer screening. You may have this screening every year starting at age 41 if you have a 30-pack-year history of smoking and currently smoke or have quit within the past 15 years.  Fecal occult blood test (FOBT) of the stool. You may have this test every year starting at age 51.  Flexible sigmoidoscopy or colonoscopy. You may have a sigmoidoscopy every 5 years or a colonoscopy every 10 years starting at age 37.  Hepatitis C blood test.  Hepatitis B blood test.  Sexually transmitted disease (STD) testing.  Diabetes screening. This is done by checking your blood sugar (glucose)  after you have not eaten for a while (fasting). You may have this done every 1-3 years.  Bone density scan. This is done to screen for osteoporosis. You may have this done starting at age 43.  Mammogram. This may be done every 1-2 years. Talk to your  health care provider about how often you should have regular mammograms. Talk with your health care provider about your test results, treatment options, and if necessary, the need for more tests. Vaccines  Your health care provider may recommend certain vaccines, such as:  Influenza vaccine. This is recommended every year.  Tetanus, diphtheria, and acellular pertussis (Tdap, Td) vaccine. You may need a Td booster every 10 years.  Zoster vaccine. You may need this after age 70.  Pneumococcal 13-valent conjugate (PCV13) vaccine. One dose is recommended after age 26.  Pneumococcal polysaccharide (PPSV23) vaccine. One dose is recommended after age 71. Talk to your health care provider about which screenings and vaccines you need and how often you need them. This information is not intended to replace advice given to you by your health care provider. Make sure you discuss any questions you have with your health care provider. Document Released: 12/01/2015 Document Revised: 07/24/2016 Document Reviewed: 09/05/2015 Elsevier Interactive Patient Education  2017 [place] Prevention in the Home Falls can cause injuries. They can happen to people of all ages. There are many things you can do to make your home safe and to help prevent falls. What can I do on the outside of my home?  Regularly fix the edges of walkways and driveways and fix any cracks.  Remove anything that might make you trip as you walk through a door, such as a raised step or threshold.  Trim any bushes or trees on the path to your home.  Use bright outdoor lighting.  Clear any walking paths of anything that might make someone trip, such as rocks or tools.  Regularly check to see if handrails are loose or broken. Make sure that both sides of any steps have handrails.  Any raised decks and porches should have guardrails on the edges.  Have any leaves, snow, or ice cleared regularly.  Use sand or salt on walking  paths during winter.  Clean up any spills in your garage right away. This includes oil or grease spills. What can I do in the bathroom?  Use night lights.  Install grab bars by the toilet and in the tub and shower. Do not use towel bars as grab bars.  Use non-skid mats or decals in the tub or shower.  If you need to sit down in the shower, use a plastic, non-slip stool.  Keep the floor dry. Clean up any water that spills on the floor as soon as it happens.  Remove soap buildup in the tub or shower regularly.  Attach bath mats securely with double-sided non-slip rug tape.  Do not have throw rugs and other things on the floor that can make you trip. What can I do in the bedroom?  Use night lights.  Make sure that you have a light by your bed that is easy to reach.  Do not use any sheets or blankets that are too big for your bed. They should not hang down onto the floor.  Have a firm chair that has side arms. You can use this for support while you get dressed.  Do not have throw rugs and other things on the floor that can make you  trip. What can I do in the kitchen?  Clean up any spills right away.  Avoid walking on wet floors.  Keep items that you use a lot in easy-to-reach places.  If you need to reach something above you, use a strong step stool that has a grab bar.  Keep electrical cords out of the way.  Do not use floor polish or wax that makes floors slippery. If you must use wax, use non-skid floor wax.  Do not have throw rugs and other things on the floor that can make you trip. What can I do with my stairs?  Do not leave any items on the stairs.  Make sure that there are handrails on both sides of the stairs and use them. Fix handrails that are broken or loose. Make sure that handrails are as long as the stairways.  Check any carpeting to make sure that it is firmly attached to the stairs. Fix any carpet that is loose or worn.  Avoid having throw rugs at  the top or bottom of the stairs. If you do have throw rugs, attach them to the floor with carpet tape.  Make sure that you have a light switch at the top of the stairs and the bottom of the stairs. If you do not have them, ask someone to add them for you. What else can I do to help prevent falls?  Wear shoes that:  Do not have high heels.  Have rubber bottoms.  Are comfortable and fit you well.  Are closed at the toe. Do not wear sandals.  If you use a stepladder:  Make sure that it is fully opened. Do not climb a closed stepladder.  Make sure that both sides of the stepladder are locked into place.  Ask someone to hold it for you, if possible.  Clearly mark and make sure that you can see:  Any grab bars or handrails.  First and last steps.  Where the edge of each step is.  Use tools that help you move around (mobility aids) if they are needed. These include:  Canes.  Walkers.  Scooters.  Crutches.  Turn on the lights when you go into a dark area. Replace any light bulbs as soon as they burn out.  Set up your furniture so you have a clear path. Avoid moving your furniture around.  If any of your floors are uneven, fix them.  If there are any pets around you, be aware of where they are.  Review your medicines with your doctor. Some medicines can make you feel dizzy. This can increase your chance of falling. Ask your doctor what other things that you can do to help prevent falls. This information is not intended to replace advice given to you by your health care provider. Make sure you discuss any questions you have with your health care provider. Document Released: 08/31/2009 Document Revised: 04/11/2016 Document Reviewed: 12/09/2014 Elsevier Interactive Patient Education  2017 [employer].

## 2020-05-08 NOTE — Telephone Encounter (Signed)
Pt did not answer the first call for her AWV, but did on the second attempt. Telephonic AWV completed.

## 2020-05-08 NOTE — Telephone Encounter (Signed)
Copied from [place] [phone]. Topic: Appointment Scheduling - Scheduling Inquiry for Clinic >> May 08, 2020  9:36 AM Mathis Bud wrote: Reason for CRM: Patient missed her AWV appt this morning,pt would like to reschedule the appt Call back [phone]

## 2020-05-16 ENCOUNTER — Other Ambulatory Visit: Payer: Self-pay

## 2020-05-16 DIAGNOSIS — Z86010 Personal history of colonic polyps: Secondary | ICD-10-CM

## 2020-05-17 ENCOUNTER — Telehealth (INDEPENDENT_AMBULATORY_CARE_PROVIDER_SITE_OTHER): Payer: Self-pay | Admitting: Gastroenterology

## 2020-05-17 ENCOUNTER — Telehealth (INDEPENDENT_AMBULATORY_CARE_PROVIDER_SITE_OTHER): Payer: Medicare Other | Admitting: Family Medicine

## 2020-05-17 ENCOUNTER — Encounter: Payer: Self-pay | Admitting: Family Medicine

## 2020-05-17 DIAGNOSIS — J069 Acute upper respiratory infection, unspecified: Secondary | ICD-10-CM | POA: Diagnosis not present

## 2020-05-17 NOTE — Progress Notes (Signed)
Patient states she does not want to scheduled colonoscopy because she will not have a covid test. She states she will have this done when she is not required to have COVID test.

## 2020-05-17 NOTE — Progress Notes (Signed)
MyChart Video Visit    Virtual Visit via Video Note   This visit type was conducted due to national recommendations for restrictions regarding the COVID-19 Pandemic (e.g. social distancing) in an effort to limit this patient's exposure and mitigate transmission in our community. This patient is at least at moderate risk for complications without adequate follow up. This format is felt to be most appropriate for this patient at this time. Physical exam was limited by quality of the video and audio technology used for the visit.   Patient location: home Provider location: bfp    Patient: Alejandra Little   DOB: 1956-09-27   64 y.o. Female  MRN: [id number] Visit Date: 05/17/2020  Today's healthcare provider: Lelon Huh, MD   Chief Complaint  Patient presents with  . URI   Subjective    HPI  HPI    URI    Associated symptoms inlclude sore throat and headache.  Recent episode started today.  The problem has been unchanged since onset.  The temperature has been with in normal range.  Patient  is drinking plenty of fluids.  Past hisotry is significant for  no history of pneumonia or bronchitis.  Patient is not a smoker.       Last edited by Jules Schick, CMA on 05/17/2020  8:33 AM. (History)     States that she woke up this morning sore throat with headaches. Covid vaccine in March. No shortness of breath. Has noticed white patches in back of throat. Glands not swollen.       Medications: Outpatient Medications Prior to Visit  Medication Sig  . ALPRAZolam (XANAX) 0.5 MG tablet Take 1 tablet (0.5 mg total) by mouth at bedtime.  Marland Kitchen dexlansoprazole (DEXILANT) 60 MG capsule Take 1 capsule (60 mg total) by mouth daily.  Marland Kitchen FLUoxetine (PROZAC) 20 MG capsule TAKE 3 CAPSULES BY MOUTH EVERY DAY (Patient taking differently: Take 60 mg by mouth daily. )  . gabapentin (NEURONTIN) 300 MG capsule Take 1 capsule (300mg ) by mouth three times daily and take 2 capsules (600mg ) by mouth at  bedtime  . HYDROcodone-acetaminophen (NORCO) 7.5-325 MG tablet Take 1 tablet by mouth 4 (four) times daily as needed for moderate pain.  . pravastatin (PRAVACHOL) 40 MG tablet TAKE 1 TABLET BY MOUTH EVERYDAY AT BEDTIME  . Specialty Vitamins Products ([facility] HAIR/SKIN/NAILS) TABS Take 1 tablet by mouth daily.   . meclizine (ANTIVERT) 25 MG tablet Take 1 tablet (25 mg total) by mouth 3 (three) times daily as needed for dizziness. (Patient not taking: Reported on 12/27/2019)   No facility-administered medications prior to visit.    Review of Systems  Constitutional: Negative.   HENT: Positive for sore throat.   Respiratory: Negative.   Cardiovascular: Negative.   Musculoskeletal: Negative.   Neurological: Positive for headaches.       Objective    There were no vitals taken for this visit.    Physical Exam  Awake, alert, oriented x 3. In no apparent distress    Assessment & Plan     1. Upper respiratory tract infection, unspecified type Onset this morning typical for common cold virus. She has had covid vaccines. Advised to avoid contact with immunocompromised and unvaccinated people. Advised to get tested for covid if she develop fever, shortness of breath or body aches. Recommend warm salt water gargles and OTC ibuprofen per label    No follow-ups on file.     I discussed the assessment and treatment plan  with the patient. The patient was provided an opportunity to ask questions and all were answered. The patient agreed with the plan and demonstrated an understanding of the instructions.   The patient was advised to call back or seek an in-person evaluation if the symptoms worsen or if the condition fails to improve as anticipated.  I provided 8 minutes of non-face-to-face time during this encounter.  The entirety of the information documented in the History of Present Illness, Review of Systems and Physical Exam were personally obtained by me. Portions of this information were  initially documented by the CMA and reviewed by me for thoroughness and accuracy.     Lelon Huh, MD [facility] [phone] (phone) [phone] (fax)  [place]

## 2020-05-17 NOTE — Patient Instructions (Signed)
.   Please review the attached list of medications and notify my office if there are any errors.   . Please bring all of your medications to every appointment so we can make sure that our medication list is the same as yours.   

## 2020-05-19 ENCOUNTER — Other Ambulatory Visit: Payer: Self-pay | Admitting: Family Medicine

## 2020-05-19 NOTE — Telephone Encounter (Signed)
Pt called to request Refill for HYDROcodone-acetaminophen ([place]) 7.5-325 MG tablet    [facility]  [id number] [place], [place] - [address] Phone:  [phone]  Fax:  [phone]

## 2020-05-20 MED ORDER — HYDROCODONE-ACETAMINOPHEN 7.5-325 MG PO TABS: 1.0000 | ORAL_TABLET | Freq: Four times a day (QID) | ORAL | 0 refills | Status: DC | PRN

## 2020-05-23 NOTE — Progress Notes (Signed)
I,Roshena L Chambers,acting as a scribe for Lelon Huh, MD.,have documented all relevant documentation on the behalf of Lelon Huh, MD,as directed by  Lelon Huh, MD while in the presence of Lelon Huh, MD.   Established patient visit   Patient: Alejandra Little   DOB: 1956-01-12   64 y.o. Female  MRN: [id number] Visit Date: 05/29/2020  Today's healthcare provider: Lelon Huh, MD   Chief Complaint  Patient presents with  . Back Pain  . Gastroesophageal Reflux  . Hyperlipidemia  . Anxiety  . Eye Pain   Subjective    HPI  Follow up for GERD:   Recent changes made 1 month ago include changing from Nexium to Dexlansoprazole due to formulary.  She reports good compliance with treatment. She feels that condition is Worse. She states that the [place]  does not work as well as Nexium. She is not having side effects.   -----------------------------------------------------------------------------------------  Follow up for chronic back pain:  The patient was last seen for this 5 months ago. Changes made at last visit include none; continue same medications.  She reports good compliance with treatment. She feels that condition is Unchanged. Pain is mostly on the left side and radiates to the right side.  She is not having side effects.   -----------------------------------------------------------------------------------------  Anxiety, Follow-up  She was last seen for anxiety 6 months ago. Changes made at last visit include none; continue same medications.   She reports good compliance with treatment. She reports good tolerance of treatment. She is not having side effects.   She feels her anxiety is moderate and Unchanged since last visit.  Symptoms: No chest pain No difficulty concentrating  No dizziness Yes fatigue  No feelings of losing control No insomnia  No irritable No palpitations  No panic attacks No racing thoughts  No shortness of  breath No sweating  No tremors/shakes    GAD-7 Results No flowsheet data found.  PHQ-9 Scores PHQ9 SCORE ONLY 05/08/2020 05/05/2019 04/30/2018  PHQ-9 Total Score 6 7 8     ---------------------------------------------------------------------------------------------------  Lipid/Cholesterol, Follow-up  Last lipid panel Other pertinent labs  Lab Results  Component Value Date   CHOL 184 05/24/2019   HDL 47 05/24/2019   LDLCALC 101 (H) 05/24/2019   TRIG 182 (H) 05/24/2019   CHOLHDL 3.9 05/24/2019   Lab Results  Component Value Date   ALT 13 05/24/2019   AST 10 05/24/2019   PLT 152 12/24/2019   TSH 2.300 02/02/2019     She was last seen for this 1 years ago.  Management since that visit includes continuing same medication.  She reports good compliance with treatment. She is not having side effects.   Symptoms: No chest pain No chest pressure/discomfort  No dyspnea No lower extremity edema  No numbness or tingling of extremity No orthopnea  No palpitations No paroxysmal nocturnal dyspnea  No speech difficulty No syncope   Current diet: in general, an "unhealthy" diet, on average, 1-2 meals per day Current exercise: none  The 10-year ASCVD risk score Mikey Bussing DC Jr., et al., 2013) is: 5%  ---------------------------------------------------------------------------------------------------   Eye pain: Patient complains of right eye pain, swelling, drainage and redness. Symptoms started 1 day ago. She has tried using Visine eye drops with no relief.   She also reports she is extremely tired and fatigued all the time. She has very short temper and feels irritable all the time. Does not want to be around other people. She feels sleepy even when getting  up in the morning. She does report history of severe sleep apnea with trial of CPAP several years ago which she did not tolerated because of the noise of the machine.        Medications: Outpatient Medications Prior to Visit    Medication Sig  . ALPRAZolam (XANAX) 0.5 MG tablet Take 1 tablet (0.5 mg total) by mouth at bedtime.  Marland Kitchen dexlansoprazole (DEXILANT) 60 MG capsule Take 1 capsule (60 mg total) by mouth daily.  Marland Kitchen FLUoxetine (PROZAC) 20 MG capsule TAKE 3 CAPSULES BY MOUTH EVERY DAY (Patient taking differently: Take 60 mg by mouth daily. )  . gabapentin (NEURONTIN) 300 MG capsule Take 1 capsule (300mg ) by mouth three times daily and take 2 capsules (600mg ) by mouth at bedtime  . HYDROcodone-acetaminophen (NORCO) 7.5-325 MG tablet Take 1 tablet by mouth 4 (four) times daily as needed for moderate pain.  . meclizine (ANTIVERT) 25 MG tablet Take 1 tablet (25 mg total) by mouth 3 (three) times daily as needed for dizziness.  . pravastatin (PRAVACHOL) 40 MG tablet TAKE 1 TABLET BY MOUTH EVERYDAY AT BEDTIME  . Specialty Vitamins Products ([employer] HAIR/SKIN/NAILS) TABS Take 1 tablet by mouth daily.    No facility-administered medications prior to visit.    Review of Systems  Constitutional: Positive for fatigue. Negative for appetite change, chills and fever.  Eyes: Positive for pain (right eye), discharge and redness.  Respiratory: Negative for chest tightness and shortness of breath.   Cardiovascular: Negative for chest pain and palpitations.  Gastrointestinal: Negative for abdominal pain, nausea and vomiting.  Musculoskeletal: Positive for back pain.  Neurological: Negative for dizziness and weakness.       Poor hand grip  Psychiatric/Behavioral: The patient is nervous/anxious.       Objective    BP 120/70 (BP Location: Left Arm, Patient Position: Sitting, Cuff Size: Large)   Pulse 72   Temp (!) 97.3 F (36.3 C) (Temporal)   Resp 18   Wt 222 lb (100.7 kg)   SpO2 97% Comment: room air  BMI 36.94 kg/m    Physical Exam   General: Appearance:    Obese female in no acute distress  Eyes:    PERRL, small stye noted inner aspect of right lower eyelid. No drainage or erythema, EOM's intact       Lungs:      Clear to auscultation bilaterally, respirations unlabored  Heart:    Normal heart rate. Normal rhythm. No murmurs, rubs, or gallops.   MS:   All extremities are intact. Tender bilateral para spinous muscles  Neurologic:   Awake, alert, oriented x 3. No apparent focal neurological           defect.         Results for orders placed or performed in visit on 05/29/20  POCT Urinalysis Dipstick  Result Value Ref Range   Color, UA amber    Clarity, UA clear    Glucose, UA Negative Negative   Bilirubin, UA small    Ketones, UA negative    Spec Grav, UA 1.020 1.010 - 1.025   Blood, UA trace (non-hemolyzed)    pH, UA 7.0 5.0 - 8.0   Protein, UA Negative Negative   Urobilinogen, UA 0.2 0.2 or 1.0 E.U./dL   Nitrite, UA negative    Leukocytes, UA Small (1+) (A) Negative   Appearance     Odor      Assessment & Plan     1. Other fatigue Suspected this is  due to or exacerbated by untreated OSA  2. Hypersomnia Did not tolerate CPAP tried several years ago. She would likely due better with auto-titrating CPAP. Need up to date - Home sleep test  3. Left low back pain, unspecified chronicity, unspecified whether sciatica present This is likely exacerbation of chronic back pain from OA. Although there was small LE on u/a today. Will culture as below.   4. Urine white blood cells increased  - Urine Culture  5. Pure hypercholesterolemia She is tolerating pravastatin well with no adverse effects.   - Comprehensive metabolic panel - CBC - Lipid panel - TSH  6. Hordeolum internum of right lower eyelid Discussed conservative treatment.   7. Morbid obesity ([place]) Exercise is limited by extreme fatigue. Will hopefully improve if OSA treated. Counseled on healthy diet choices.   8. Need for vaccination against Streptococcus pneumoniae  - Pneumococcal polysaccharide vaccine 23-valent greater than or equal to 2yo subcutaneous/IM   No follow-ups on file.      The entirety of the  information documented in the History of Present Illness, Review of Systems and Physical Exam were personally obtained by me. Portions of this information were initially documented by the CMA and reviewed by me for thoroughness and accuracy.    Addressed extensive list of chronic and acute medical problems today requiring 45 minutes reviewing her medical record, counseling patient regarding her conditions and coordination of care.      Lelon Huh, MD  [facility] [phone] (phone) [phone] (fax)  [place]

## 2020-05-29 ENCOUNTER — Ambulatory Visit (INDEPENDENT_AMBULATORY_CARE_PROVIDER_SITE_OTHER): Payer: Medicare Other | Admitting: Family Medicine

## 2020-05-29 ENCOUNTER — Encounter: Payer: Self-pay | Admitting: Family Medicine

## 2020-05-29 ENCOUNTER — Other Ambulatory Visit: Payer: Self-pay

## 2020-05-29 ENCOUNTER — Telehealth: Payer: Self-pay | Admitting: Family Medicine

## 2020-05-29 VITALS — BP 120/70 | HR 72 | Temp 97.3°F | Resp 18 | Wt 222.0 lb

## 2020-05-29 DIAGNOSIS — E2839 Other primary ovarian failure: Secondary | ICD-10-CM

## 2020-05-29 DIAGNOSIS — G4710 Hypersomnia, unspecified: Secondary | ICD-10-CM

## 2020-05-29 DIAGNOSIS — R5383 Other fatigue: Secondary | ICD-10-CM

## 2020-05-29 DIAGNOSIS — R82998 Other abnormal findings in urine: Secondary | ICD-10-CM

## 2020-05-29 DIAGNOSIS — Z23 Encounter for immunization: Secondary | ICD-10-CM

## 2020-05-29 DIAGNOSIS — H00022 Hordeolum internum right lower eyelid: Secondary | ICD-10-CM

## 2020-05-29 DIAGNOSIS — M545 Low back pain, unspecified: Secondary | ICD-10-CM

## 2020-05-29 DIAGNOSIS — G8929 Other chronic pain: Secondary | ICD-10-CM

## 2020-05-29 DIAGNOSIS — E7800 Pure hypercholesterolemia, unspecified: Secondary | ICD-10-CM

## 2020-05-29 LAB — POCT URINALYSIS DIPSTICK
Glucose, UA: NEGATIVE
Ketones, UA: NEGATIVE
Nitrite, UA: NEGATIVE
Protein, UA: NEGATIVE
Spec Grav, UA: 1.02 (ref 1.010–1.025)
Urobilinogen, UA: 0.2 E.U./dL
pH, UA: 7 (ref 5.0–8.0)

## 2020-05-29 MED ORDER — CEPHALEXIN 500 MG PO CAPS: 500.0000 mg | ORAL_CAPSULE | Freq: Two times a day (BID) | ORAL | 0 refills | Status: AC

## 2020-05-29 NOTE — Telephone Encounter (Signed)
Patient is calling to see if results are back from the Urine culture? She is having sharp pains in her back with back pain on right side.  Patient is requesting medication for possible UTI. Preferred Pharmacy- [facility] [phone]

## 2020-05-29 NOTE — Telephone Encounter (Signed)
Have sent prescription for cephalexin to her pharmacy

## 2020-05-29 NOTE — Telephone Encounter (Signed)
Patient advised that urine culture is not back yet. I advised her that the results may take up to 3-5 days for results (as stated on [place] website). Patient wants to know if Dr. Caryn Section can go ahead and treat her for UTI? She says her back is hurting and she doesn't want to wait 3 days for the result. She says by that time things could get worse. Please advise.

## 2020-05-29 NOTE — Patient Instructions (Signed)
Please call the [facility] at [facility] at [phone] to schedule your mammogram.  

## 2020-05-30 LAB — COMPREHENSIVE METABOLIC PANEL
ALT: 12 IU/L (ref 0–32)
AST: 12 IU/L (ref 0–40)
Albumin/Globulin Ratio: 1.7 (ref 1.2–2.2)
Albumin: 4.2 g/dL (ref 3.8–4.8)
Alkaline Phosphatase: 64 IU/L (ref 48–121)
BUN/Creatinine Ratio: 16 (ref 12–28)
BUN: 12 mg/dL (ref 8–27)
Bilirubin Total: 0.2 mg/dL (ref 0.0–1.2)
CO2: 26 mmol/L (ref 20–29)
Calcium: 9.2 mg/dL (ref 8.7–10.3)
Chloride: 106 mmol/L (ref 96–106)
Creatinine, Ser: 0.74 mg/dL (ref 0.57–1.00)
GFR calc Af Amer: 98 mL/min/{1.73_m2} (ref >59)
GFR calc non Af Amer: 85 mL/min/{1.73_m2} (ref >59)
Globulin, Total: 2.5 g/dL (ref 1.5–4.5)
Glucose: 99 mg/dL (ref 65–99)
Potassium: 4.4 mmol/L (ref 3.5–5.2)
Sodium: 144 mmol/L (ref 134–144)
Total Protein: 6.7 g/dL (ref 6.0–8.5)

## 2020-05-30 LAB — LIPID PANEL
Chol/HDL Ratio: 4.6 ratio — ABNORMAL HIGH (ref 0.0–4.4)
Cholesterol, Total: 208 mg/dL — ABNORMAL HIGH (ref 100–199)
HDL: 45 mg/dL (ref >39)
LDL Chol Calc (NIH): 132 mg/dL — ABNORMAL HIGH (ref 0–99)
Triglycerides: 171 mg/dL — ABNORMAL HIGH (ref 0–149)
VLDL Cholesterol Cal: 31 mg/dL (ref 5–40)

## 2020-05-30 LAB — CBC
Hematocrit: 40.4 % (ref 34.0–46.6)
Hemoglobin: 13.4 g/dL (ref 11.1–15.9)
MCH: 30.4 pg (ref 26.6–33.0)
MCHC: 33.2 g/dL (ref 31.5–35.7)
MCV: 92 fL (ref 79–97)
Platelets: 155 10*3/uL (ref 150–450)
RBC: 4.41 x10E6/uL (ref 3.77–5.28)
RDW: 12.4 % (ref 11.7–15.4)
WBC: 6.1 10*3/uL (ref 3.4–10.8)

## 2020-05-30 LAB — TSH: TSH: 1.47 u[IU]/mL (ref 0.450–4.500)

## 2020-05-30 NOTE — Telephone Encounter (Signed)
Advised 

## 2020-05-31 ENCOUNTER — Telehealth: Payer: Self-pay | Admitting: Family Medicine

## 2020-05-31 LAB — URINE CULTURE

## 2020-05-31 MED ORDER — FLUCONAZOLE 150 MG PO TABS: 150.0000 mg | ORAL_TABLET | Freq: Once | ORAL | 0 refills | Status: AC

## 2020-05-31 NOTE — Telephone Encounter (Signed)
Patient requesting a perscrption for diflucan (treat yeast infection). Patient was given a prescription for UTI antibiotic and typically gets a yeast infection from antibiotic.

## 2020-05-31 NOTE — Telephone Encounter (Signed)
Patient states when she had her visit with PCP she had asked for Diflucan to take after antibiotic therapy. She states she gets frequent yeast after antibiotic use. Told [atient I would send [facility] PCP. [facility] [place].

## 2020-06-14 IMAGING — MR MR HEAD W/O CM
12 series · 43 of 48 positions shown · non-contrast
Comparison: None.

CLINICAL DATA: Severe dizziness beginning today.  Headache.

EXAM:
MRI HEAD WITHOUT CONTRAST
TECHNIQUE: Multiplanar, multiecho pulse sequences of the brain and surrounding
structures were obtained without intravenous contrast.

[Series 5: ax dwi_tracew · axial · 3.0mm · 0.60mm/px · z∈[-123,+32]mm · 5 of 96 slices shown]
[im 1/96]
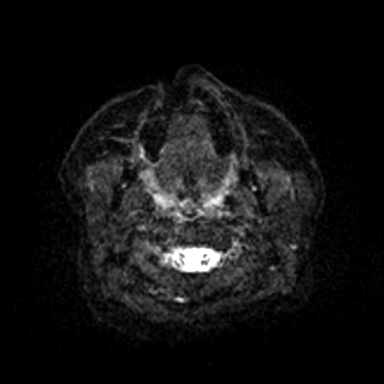
[im 24/96]
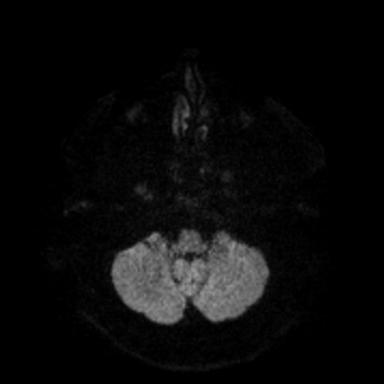
[im 48/96]
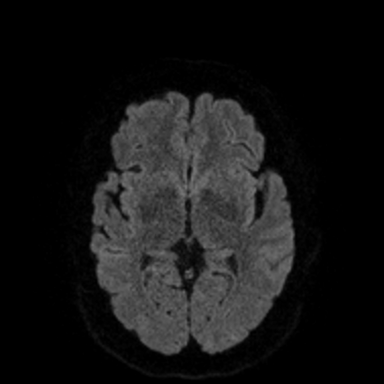
[im 72/96]
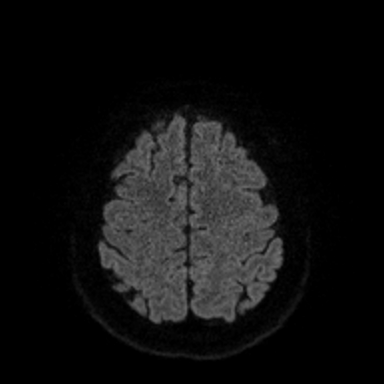
[im 96/96]
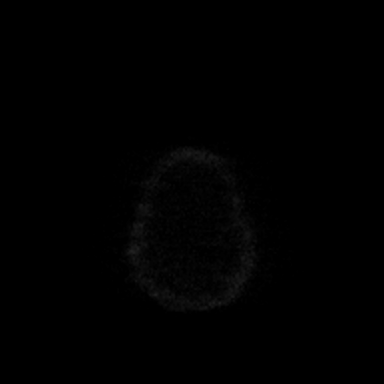

[Series 6: ax dwi_adc · axial · 3.0mm · 0.60mm/px · z∈[-123,+32]mm · 3 of 48 slices shown]
[im 1/48]
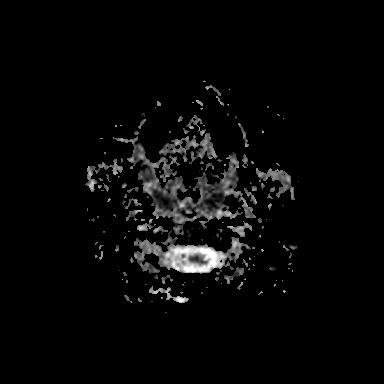
[im 24/48]
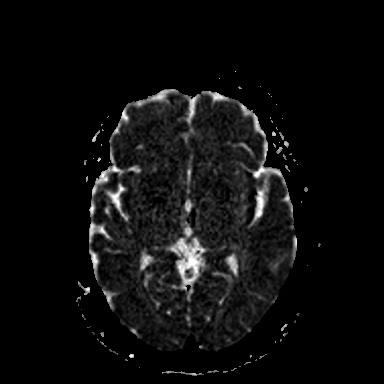
[im 48/48]
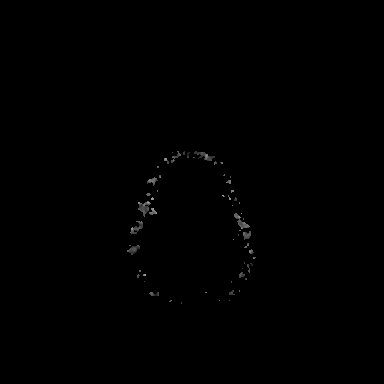

[Series 7: cor dwi_tracew · coronal · 5.0mm · 0.60mm/px · 5 of 76 slices shown]
[im 1/76]
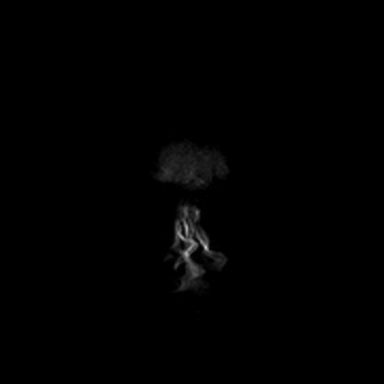
[im 19/76]
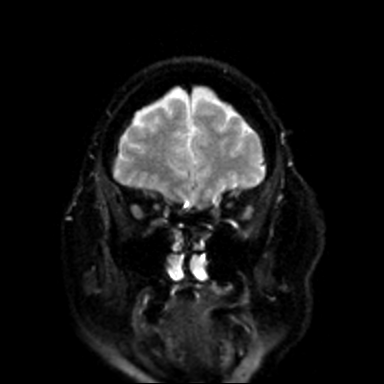
[im 38/76]
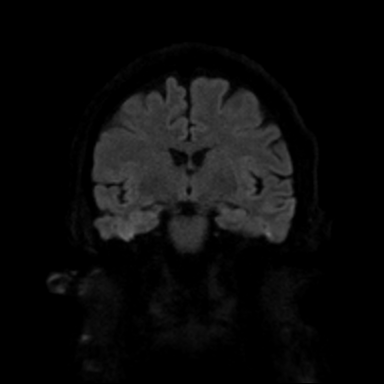
[im 57/76]
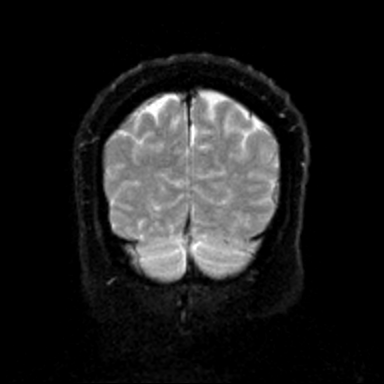
[im 76/76]
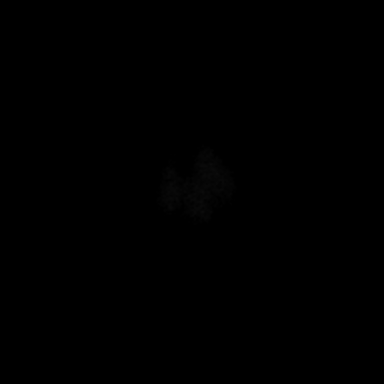

[Series 8: cor dwi_adc · coronal · 5.0mm · 0.60mm/px · 2 of 37 slices shown]
[im 1/37]
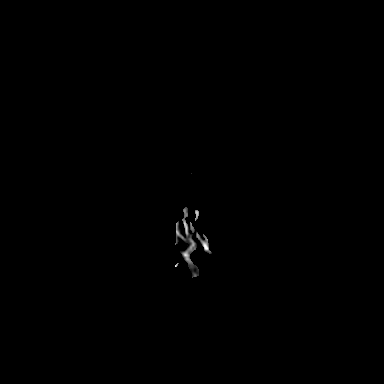
[im 37/37]
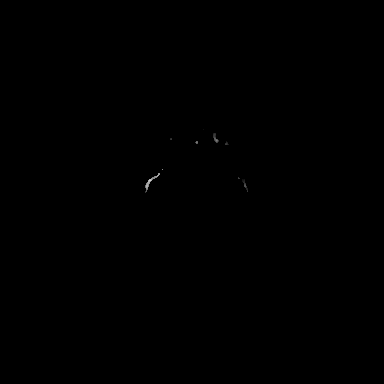

[Series 9: T1 · sagittal · 5.0mm · 0.62mm/px · 2 of 25 slices shown (1 of 2)]
[im 1/25]
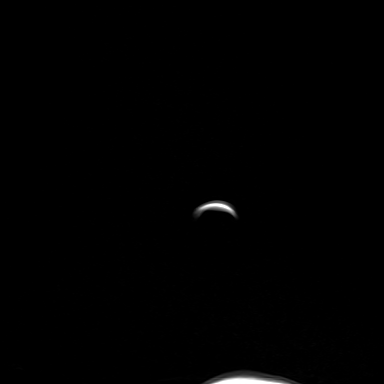
[im 25/25]
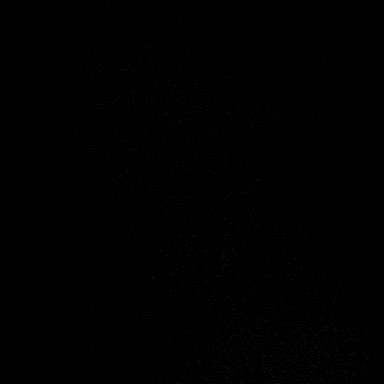

[Series 10: T2 · axial · 5.0mm · 0.53mm/px · z∈[-118,+26]mm · 2 of 25 slices shown (1 of 2)]
[im 1/25]
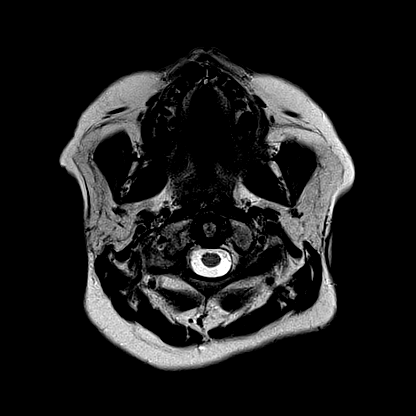
[im 25/25]
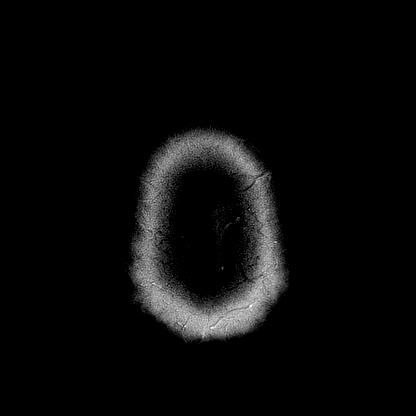

[Series 11: mag_images · axial · 3.0mm · 0.90mm/px · z∈[-134,+43]mm · 4 of 60 slices shown]
[im 1/60]
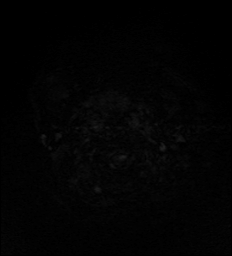
[im 20/60]
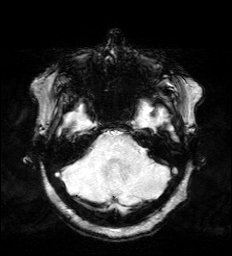
[im 40/60]
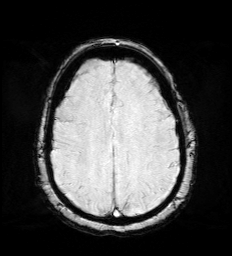
[im 60/60]
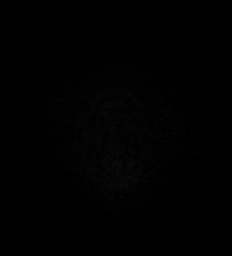

[Series 12: pha_images · axial · 3.0mm · 0.90mm/px · z∈[-134,+37]mm · 4 of 58 slices shown]
[im 1/58]
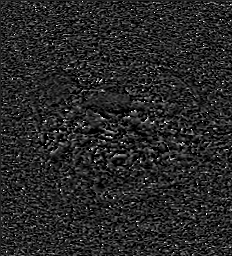
[im 20/58]
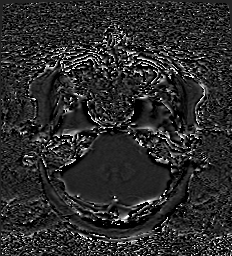
[im 39/58]
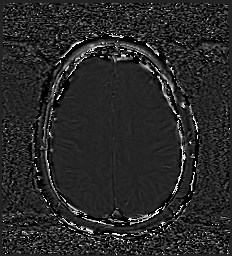
[im 58/58]
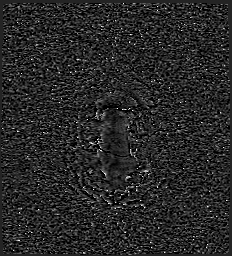

[Series 13: swi_images · axial · 3.0mm · 0.90mm/px · z∈[-134,-77]mm · 2 of 60 slices shown]
[im 1/60]
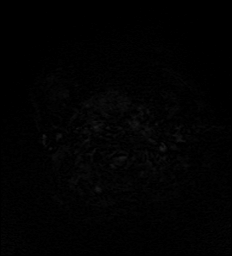
[im 20/60]
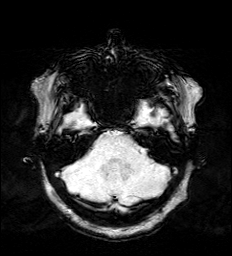

[Series 15: FLAIR · axial · 3.0mm · 0.53mm/px · z∈[-127,+35]mm · 4 of 55 slices shown]
[im 1/55]
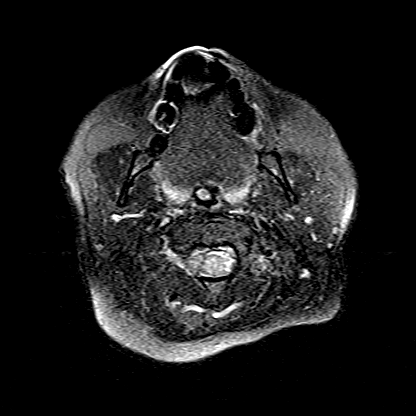
[im 19/55]
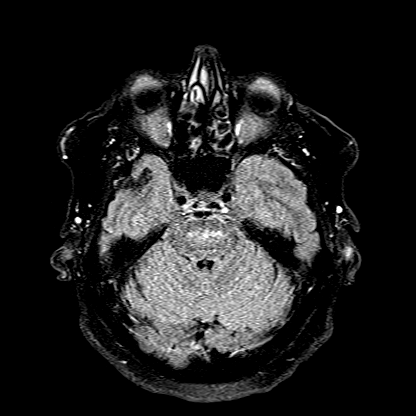
[im 37/55]
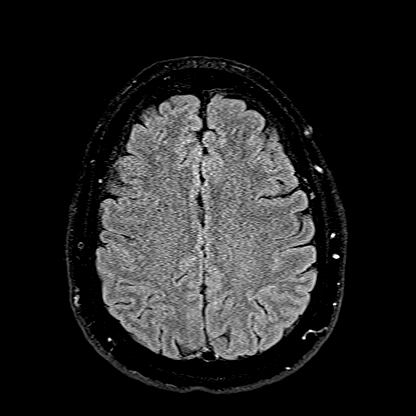
[im 55/55]
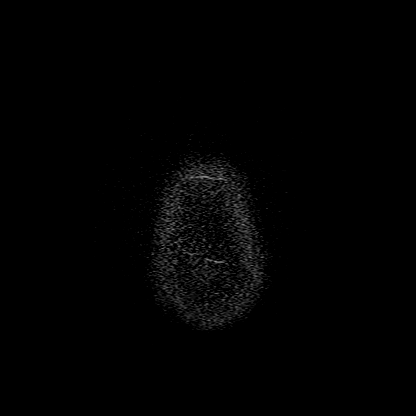

[Series 16: T1 · axial · 1.0mm · 0.98mm/px · z∈[-129,+46]mm · 8 of 176 slices shown (2 of 2)]
[im 1/176]
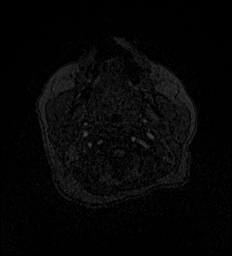
[im 36/176]
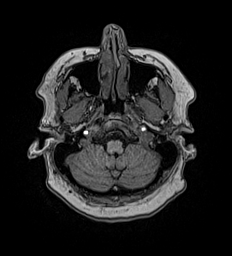
[im 53/176]
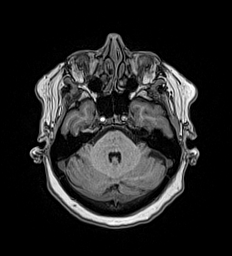
[im 71/176]
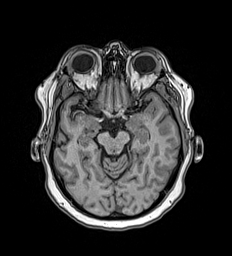
[im 106/176]
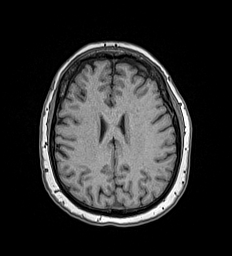
[im 123/176]
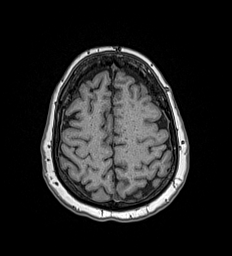
[im 141/176]
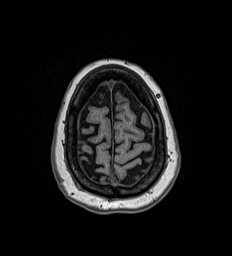
[im 176/176]
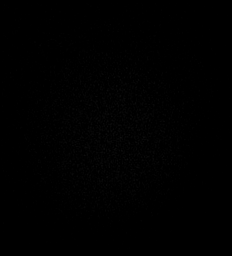

[Series 17: T2 · coronal · 5.0mm · 0.57mm/px · 2 of 25 slices shown (2 of 2)]
[im 1/25]
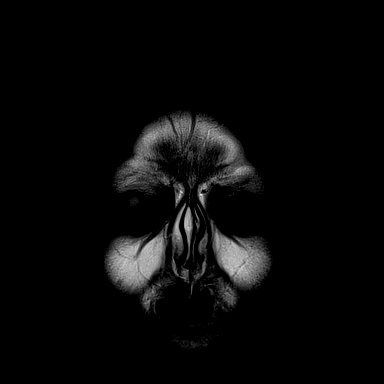
[im 25/25]
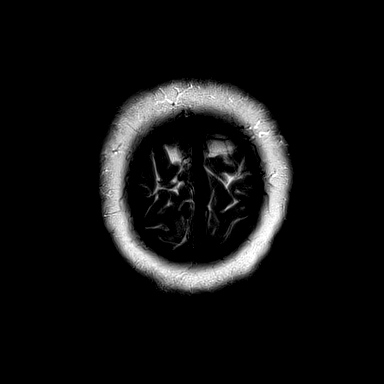

[43 of 48 positions shown; findings below may reference images not displayed]

FINDINGS: Brain: Diffusion imaging does not show any acute or subacute
infarction. Chronic small-vessel ischemic changes affect the pons.
No focal cerebellar insult. Cerebral hemispheres show a few small
foci of T2 and FLAIR signal in the white matter consistent with
minimal small vessel change. No cortical or large vessel territory
insult. No mass lesion, hemorrhage, hydrocephalus or extra-axial
collection.

Vascular: Major vessels at the base of the brain show flow.

Skull and upper cervical spine: Negative

Sinuses/Orbits: Clear/normal

Other: None
IMPRESSION: No acute finding. Chronic small-vessel ischemic changes, more
prominent affecting the pons than the cerebral hemispheric white
matter. No specific cause of acute dizziness is identified.

## 2020-06-14 IMAGING — CR DG CHEST 2V
1 series · 2 of 2 positions shown · non-contrast
Comparison: Radiograph 02/05/2012

CLINICAL DATA: Chest pressure for 1 day

EXAM:
CHEST - 2 VIEW

[Series 1: dg chest 2 view · 0.14mm/px · 2 of 2 slices shown]
[im 1/2]
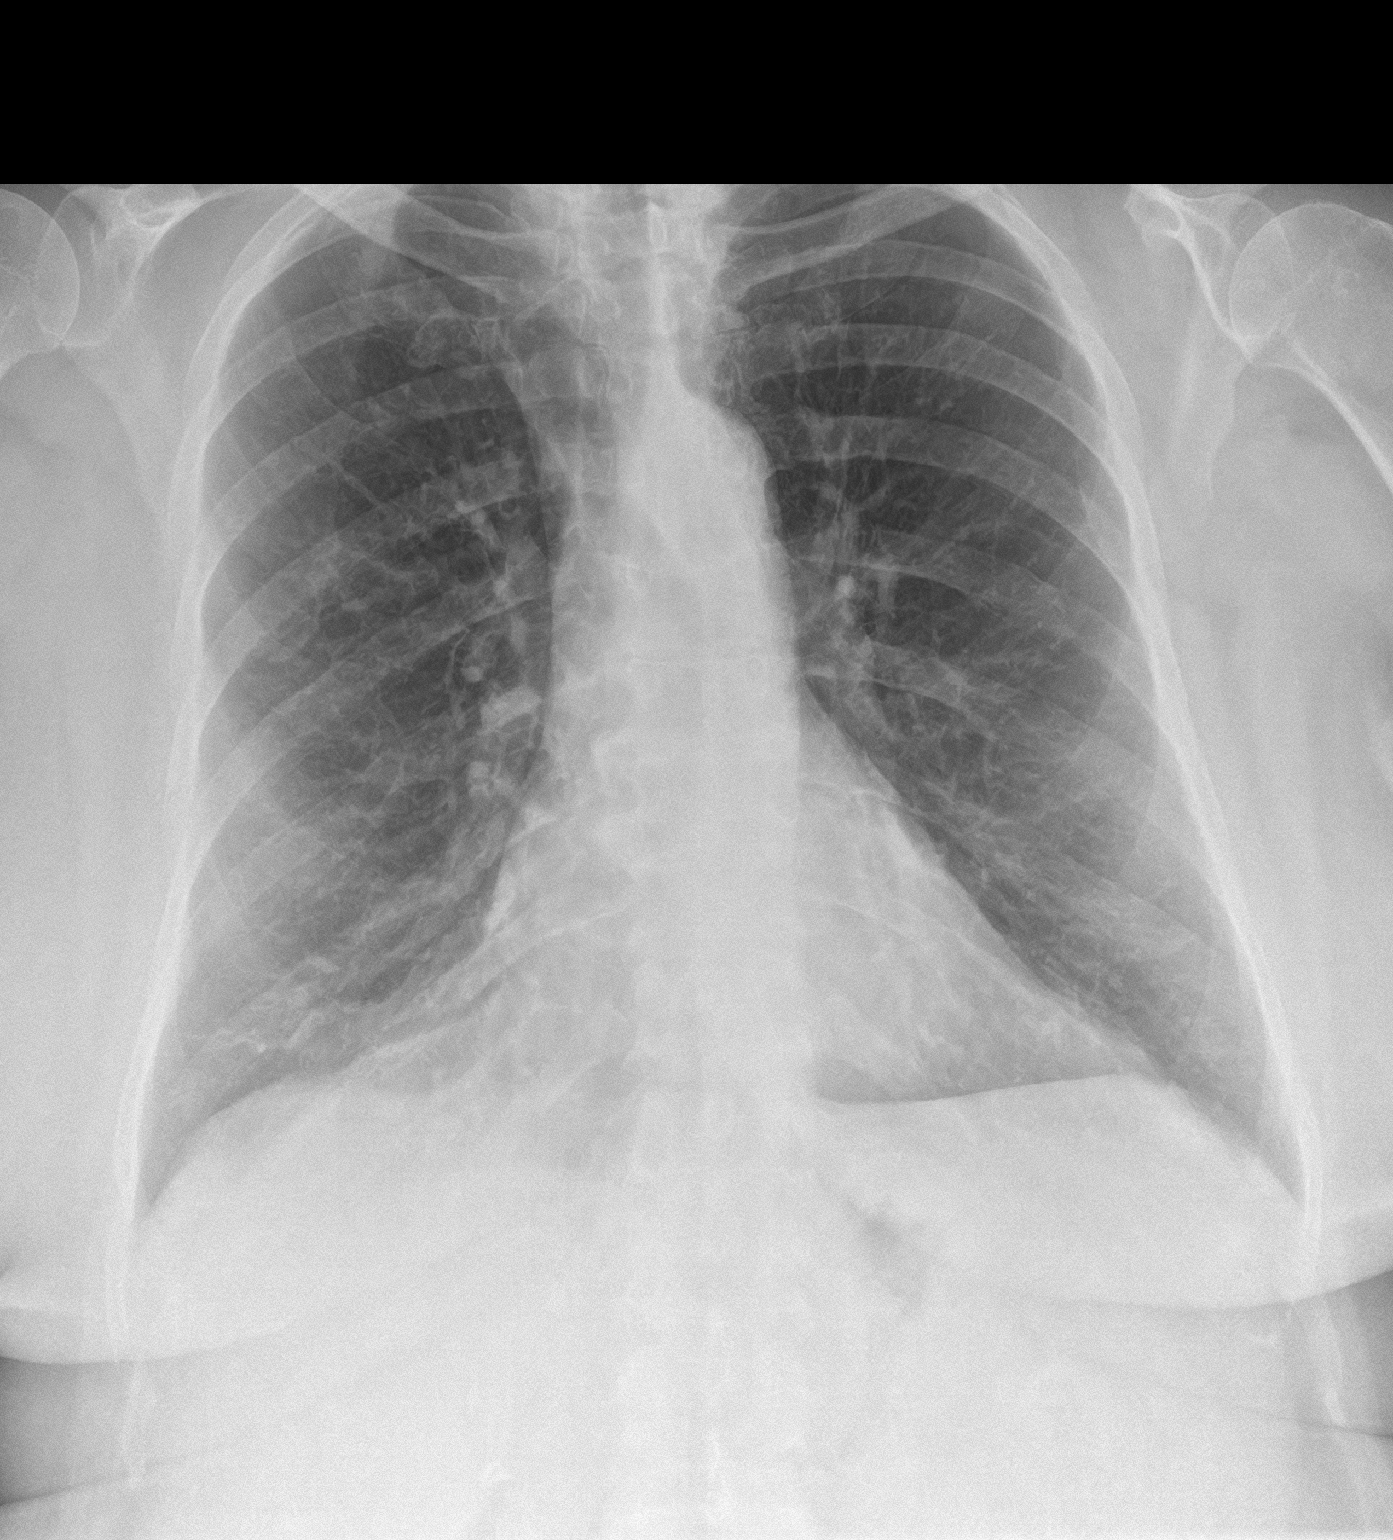
[im 2/2]
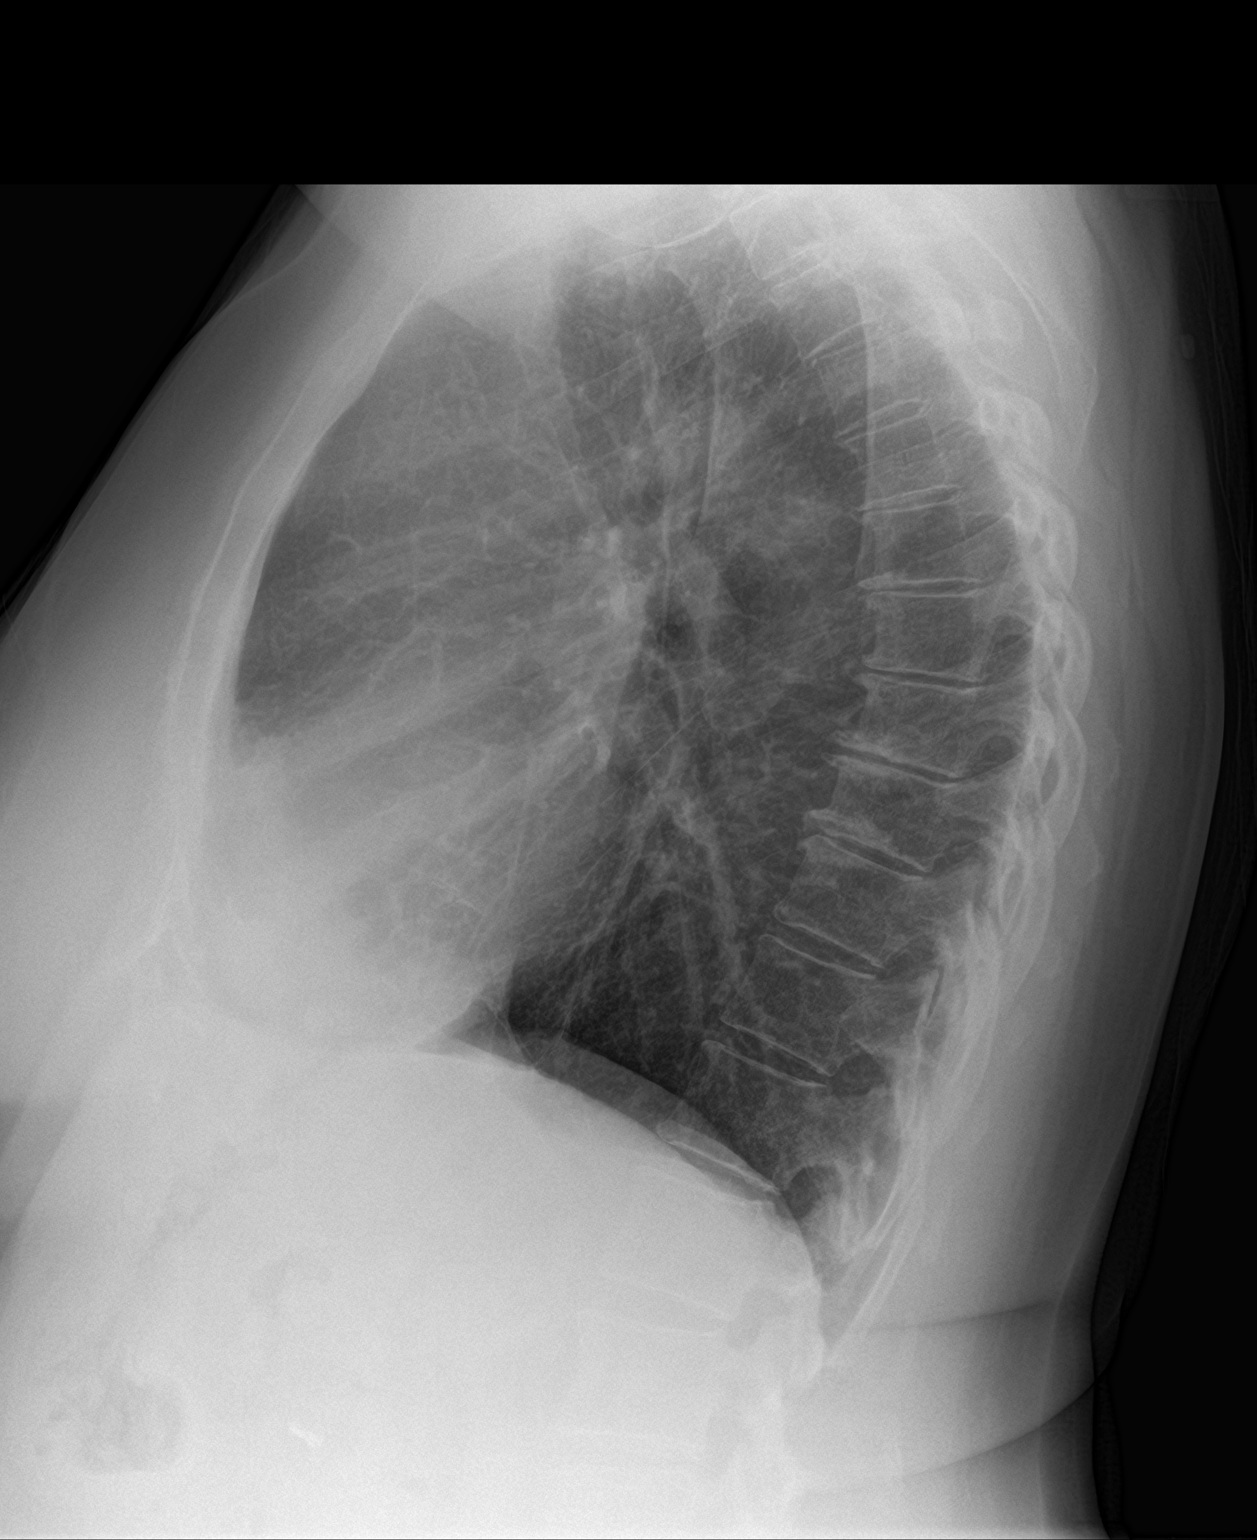

[2 of 2 positions shown; findings below may reference images not displayed]

FINDINGS: Slight lung hyperinflation with coarsened interstitial changes. No
consolidation, features of edema, pneumothorax, or effusion. The
cardiomediastinal contours are unremarkable. No acute osseous or
soft tissue abnormality.
IMPRESSION: Chronic hyperinflation and coarse interstitial changes, may reflect
smoking related changes.

No acute cardiopulmonary abnormality.

## 2020-06-21 ENCOUNTER — Telehealth: Payer: Self-pay

## 2020-06-21 ENCOUNTER — Other Ambulatory Visit: Payer: Self-pay | Admitting: Family Medicine

## 2020-06-21 MED ORDER — HYDROCODONE-ACETAMINOPHEN 7.5-325 MG PO TABS: 1.0000 | ORAL_TABLET | Freq: Four times a day (QID) | ORAL | 0 refills | Status: DC | PRN

## 2020-06-21 NOTE — Telephone Encounter (Signed)
Order for home sleep study faxed to [facility] °

## 2020-06-21 NOTE — Telephone Encounter (Signed)
Requested medication (s) are due for refill today: yes  Requested medication (s) are on the active medication list: yes  Last refill:  05/20/2020  Future visit scheduled: no  Notes to clinic:  this refill cannot be delegated    Requested Prescriptions  Pending Prescriptions Disp Refills   HYDROcodone-acetaminophen (NORCO) 7.5-325 MG tablet 120 tablet 0    Sig: Take 1 tablet by mouth 4 (four) times daily as needed for moderate pain.      Not Delegated - Analgesics:  Opioid Agonist Combinations Failed - 06/21/2020  9:29 AM      Failed - This refill cannot be delegated      Failed - Urine Drug Screen completed in last 360 days.      Passed - Valid encounter within last 6 months    Recent Outpatient Visits           3 weeks ago Other fatigue   [facility] [name], MD   1 month ago Upper respiratory tract infection, unspecified type   [facility] [name], MD   5 months ago Vertigo   [facility] [name], MD   6 months ago [place], Donald E, MD   1 year ago Pure hypercholesterolemia   [place], Kirstie Peri, MD

## 2020-06-21 NOTE — Telephone Encounter (Signed)
Copied from [place] [id number]. Topic: General - Other >> Jun 21, 2020  9:23 AM Leward Quan A wrote: Reason for CRM: Patient called to inquire of Dr Caryn Section about referral that she said was discussed for sleep apnea test asking for a call back with some information Ph# [phone]

## 2020-06-21 NOTE — Telephone Encounter (Signed)
Copied from [place] [id number]. Topic: Quick Communication - Rx Refill/Question >> Jun 21, 2020  9:21 AM Leward Quan A wrote: Medication: HYDROcodone-acetaminophen ([place]) 7.5-325 MG tablet   Has the patient contacted their pharmacy? No. (Agent: If no, request that the patient contact the pharmacy for the refill.) (Agent: If yes, when and what did the pharmacy advise?)  Preferred Pharmacy (with phone number or street name): [id number] [place], [place] - [address]  Phone:  [phone] Fax:  [phone]     Agent: Please be advised that RX refills may take up to 3 business days. We ask that you follow-up with your pharmacy.

## 2020-06-22 ENCOUNTER — Other Ambulatory Visit: Payer: Self-pay | Admitting: Family Medicine

## 2020-06-22 ENCOUNTER — Telehealth: Payer: Self-pay

## 2020-06-22 DIAGNOSIS — K219 Gastro-esophageal reflux disease without esophagitis: Secondary | ICD-10-CM

## 2020-06-22 NOTE — Telephone Encounter (Signed)
Copied from [place] [phone]. Topic: General - Call Back - No Documentation >> Jun 22, 2020  1:03 PM Jaynie Collins D wrote: Reason for CRM: Patient was prescribed new medication Delexant and is causing severe diarrhea, patient would like another alternative. States PCP tried in the past to get a medication approved by insurance but was unable to. Please call patient baack with resoultion.

## 2020-06-23 MED ORDER — NEXIUM 40 MG PO CPDR: 40.0000 mg | DELAYED_RELEASE_CAPSULE | Freq: Every day | ORAL | 4 refills | Status: DC

## 2020-07-21 ENCOUNTER — Other Ambulatory Visit: Payer: Self-pay | Admitting: Family Medicine

## 2020-07-21 MED ORDER — HYDROCODONE-ACETAMINOPHEN 7.5-325 MG PO TABS: 1.0000 | ORAL_TABLET | Freq: Four times a day (QID) | ORAL | 0 refills | Status: DC | PRN

## 2020-07-21 NOTE — Telephone Encounter (Signed)
Pt is calling to request a refill on hydrocodone . [facility] [address]

## 2020-07-21 NOTE — Telephone Encounter (Signed)
Requested medication (s) are due for refill today: yes   Requested medication (s) are on the active medication list: yes   Last refill:  06/21/2020  Future visit scheduled: no  Notes to clinic:  this refill cannot be delegated    Requested Prescriptions  Pending Prescriptions Disp Refills   HYDROcodone-acetaminophen (NORCO) 7.5-325 MG tablet 120 tablet 0    Sig: Take 1 tablet by mouth 4 (four) times daily as needed for moderate pain.      Not Delegated - Analgesics:  Opioid Agonist Combinations Failed - 07/21/2020  9:20 AM      Failed - This refill cannot be delegated      Failed - Urine Drug Screen completed in last 360 days.      Passed - Valid encounter within last 6 months    Recent Outpatient Visits           1 month ago Other fatigue   [facility] [name], MD   2 months ago Upper respiratory tract infection, unspecified type   [facility] [name], MD   6 months ago Vertigo   [facility] [name], MD   7 months ago [place], Donald E, MD   1 year ago Pure hypercholesterolemia   [place], Kirstie Peri, MD

## 2020-07-25 ENCOUNTER — Ambulatory Visit: Payer: Self-pay

## 2020-07-25 NOTE — Telephone Encounter (Signed)
Just FYI.

## 2020-07-25 NOTE — Telephone Encounter (Signed)
Pt. Reports she has had dizziness and vertigo x 1 week. OTC has helped "a little." Seems worse when she looks down and then back up. Reports she has had this in the past. Appointment made.  Reason for Disposition . [1] MODERATE dizziness (e.g., vertigo; feels very unsteady, interferes with normal activities) AND [2] has NOT been evaluated by physician for this  Answer Assessment - Initial Assessment Questions 1. DESCRIPTION: "Describe your dizziness."     Dizzy 2. VERTIGO: "Do you feel like either you or the room is spinning or tilting?"      Yes 3. LIGHTHEADED: "Do you feel lightheaded?" (e.g., somewhat faint, woozy, weak upon standing)     Woozy 4. SEVERITY: "How bad is it?"  "Can you walk?"   - MILD: Feels unsteady but walking normally.   - MODERATE: Feels very unsteady when walking, but not falling; interferes with normal activities (e.g., school, work) .   - SEVERE: Unable to walk without falling, or requires assistance to walk without falling.     Moderate 5. ONSET:  "When did the dizziness begin?"     I week 6. AGGRAVATING FACTORS: "Does anything make it worse?" (e.g., standing, change in head position)     Head movement 7. CAUSE: "What do you think is causing the dizziness?"     Vertigo 8. RECURRENT SYMPTOM: "Have you had dizziness before?" If Yes, ask: "When was the last time?" "What happened that time?"     Yes 9. OTHER SYMPTOMS: "Do you have any other symptoms?" (e.g., headache, weakness, numbness, vomiting, earache)     Headache 10. PREGNANCY: "Is there any chance you are pregnant?" "When was your last menstrual period?"       No  Protocols used: DIZZINESS - VERTIGO-A-AH

## 2020-07-26 ENCOUNTER — Ambulatory Visit (INDEPENDENT_AMBULATORY_CARE_PROVIDER_SITE_OTHER): Payer: Medicare Other | Admitting: Physician Assistant

## 2020-07-26 ENCOUNTER — Other Ambulatory Visit: Payer: Self-pay

## 2020-07-26 ENCOUNTER — Encounter: Payer: Self-pay | Admitting: Physician Assistant

## 2020-07-26 VITALS — BP 127/80 | HR 66 | Temp 98.6°F | Ht 65.0 in | Wt 216.2 lb

## 2020-07-26 DIAGNOSIS — R42 Dizziness and giddiness: Secondary | ICD-10-CM | POA: Diagnosis not present

## 2020-07-26 DIAGNOSIS — R197 Diarrhea, unspecified: Secondary | ICD-10-CM

## 2020-07-26 DIAGNOSIS — Z1211 Encounter for screening for malignant neoplasm of colon: Secondary | ICD-10-CM | POA: Diagnosis not present

## 2020-07-26 MED ORDER — MECLIZINE HCL 25 MG PO TABS: 25.0000 mg | ORAL_TABLET | Freq: Three times a day (TID) | ORAL | 0 refills | Status: DC | PRN

## 2020-07-26 MED ORDER — MECLIZINE HCL 25 MG PO TABS: 25.0000 mg | ORAL_TABLET | Freq: Three times a day (TID) | ORAL | 1 refills | Status: DC | PRN

## 2020-07-26 NOTE — Progress Notes (Signed)
Established patient visit   Patient: Alejandra Little   DOB: Dec 04, 1955   64 y.o. Female  MRN: [id number] Visit Date: 07/26/2020  Today's healthcare provider: Trinna Post, PA-C   Chief Complaint  Patient presents with  . Dizziness   Subjective    HPI  Patient presents today with dizziness. She reports that she has had symptoms X 1 week. She reports that she has had this before in the past. She has had an episode of vertigo in 12/2019 that she was seen for in the ER. She had an MRI brain which was negative. She reports dizziness particularly when she turns over in bed and looks down at the ground. She reports some nausea without vision. She denies vision change, focal weakness.   Reports bloating and abdominal pain with most food intake. She reports some diarrhea without blood. She does consume dairy.  She is due for colonoscopy. She was referred 04/2020 for colonoscopy but declined to schedule because she had to have a COVID test prior to the procedure. She is now willing to do this.      Medications: Outpatient Medications Prior to Visit  Medication Sig  . ALPRAZolam (XANAX) 0.5 MG tablet Take 1 tablet (0.5 mg total) by mouth at bedtime.  Marland Kitchen FLUoxetine (PROZAC) 20 MG capsule TAKE 3 CAPSULES BY MOUTH EVERY DAY (Patient taking differently: Take 60 mg by mouth daily. )  . gabapentin (NEURONTIN) 300 MG capsule Take 1 capsule (300mg ) by mouth three times daily and take 2 capsules (600mg ) by mouth at bedtime  . HYDROcodone-acetaminophen (NORCO) 7.5-325 MG tablet Take 1 tablet by mouth 4 (four) times daily as needed for moderate pain.  Marland Kitchen NEXIUM 40 MG capsule Take 1 capsule (40 mg total) by mouth daily.  . pravastatin (PRAVACHOL) 40 MG tablet TAKE 1 TABLET BY MOUTH EVERYDAY AT BEDTIME  . Specialty Vitamins Products ([facility] HAIR/SKIN/NAILS) TABS Take 1 tablet by mouth daily.   . [DISCONTINUED] meclizine (ANTIVERT) 25 MG tablet Take 1 tablet (25 mg total) by mouth 3 (three) times daily as  needed for dizziness. (Patient not taking: Reported on 07/26/2020)   No facility-administered medications prior to visit.    Review of Systems  Constitutional: Negative.   Respiratory: Negative.   Cardiovascular: Negative.   Skin: Positive for rash.  Hematological: Negative.       Objective    BP 127/80   Pulse 66   Temp 98.6 F (37 C)   Ht 5\' 5"  (1.651 m)   Wt 216 lb 3.2 oz (98.1 kg)   BMI 35.98 kg/m    Physical Exam Constitutional:      Appearance: She is obese.  HENT:     Right Ear: Tympanic membrane normal.     Left Ear: Tympanic membrane normal.  Eyes:     Extraocular Movements: Extraocular movements intact.     Conjunctiva/sclera: Conjunctivae normal.     Pupils: Pupils are equal, round, and reactive to light.  Pulmonary:     Effort: Pulmonary effort is normal.  Abdominal:     General: Bowel sounds are normal.     Palpations: Abdomen is soft.  Skin:    General: Skin is warm and dry.  Neurological:     Mental Status: She is alert and oriented to person, place, and time. Mental status is at baseline.     Motor: No weakness.     Coordination: Coordination normal.     Gait: Gait normal.  Psychiatric:  Mood and Affect: Mood normal.        Behavior: Behavior normal.       No results found for any visits on 07/26/20.  Assessment & Plan    1. Vertigo  Treat as below, refer to ENT for further treatment.   - Ambulatory referral to ENT - meclizine (ANTIVERT) 25 MG tablet; Take 1 tablet (25 mg total) by mouth 3 (three) times daily as needed for dizziness.  Dispense: 30 tablet; Refill: 1  2. Diarrhea, unspecified type  Recommend eliminating dairy.  3. Colon cancer screening  Refer for colonoscopy.   - Ambulatory referral to Gastroenterology    No follow-ups on file.      ITrinna Post, PA-C, have reviewed all documentation for this visit. The documentation on 08/01/20 for the exam, diagnosis, procedures, and orders are all accurate and  complete.  The entirety of the information documented in the History of Present Illness, Review of Systems and Physical Exam were personally obtained by me. Portions of this information were initially documented by [facility] and reviewed by me for thoroughness and accuracy.     Paulene Floor  [facility] [phone] (phone) [phone] (fax)  [place]

## 2020-08-02 ENCOUNTER — Other Ambulatory Visit: Payer: Self-pay

## 2020-08-02 ENCOUNTER — Telehealth (INDEPENDENT_AMBULATORY_CARE_PROVIDER_SITE_OTHER): Payer: Self-pay | Admitting: Gastroenterology

## 2020-08-02 DIAGNOSIS — Z86010 Personal history of colonic polyps: Secondary | ICD-10-CM

## 2020-08-02 DIAGNOSIS — Z1211 Encounter for screening for malignant neoplasm of colon: Secondary | ICD-10-CM

## 2020-08-02 MED ORDER — PEG 3350-KCL-NA BICARB-NACL 420 G PO SOLR: 4000.0000 mL | Freq: Once | ORAL | 0 refills | Status: AC

## 2020-08-02 NOTE — Progress Notes (Signed)
Gastroenterology Pre-Procedure Review  Request Date: Monday 08/21/20 Requesting Physician: Dr. Marius Ditch  PATIENT REVIEW QUESTIONS: The patient responded to the following health history questions as indicated:    1. Are you having any GI issues? Belly bloating and diarrhea.  Advised per Carles Collet to stop dairy products 2. Do you have a personal history of Polyps? yes (patient states her last colonoscopy was with Dr. Vira Agar and she thinks he removed polyps 12/14/12) 3. Do you have a family history of Colon Cancer or Polyps? yes (twin sister had 7 polyps recently) 4. Diabetes Mellitus? no 5. Joint replacements in the past 12 months?no 6. Major health problems in the past 3 months?no 7. Any artificial heart valves, MVP, or defibrillator?no    MEDICATIONS & ALLERGIES:    Patient reports the following regarding taking any anticoagulation/antiplatelet therapy:   Plavix, Coumadin, Eliquis, Xarelto, Lovenox, Pradaxa, Brilinta, or Effient? no Aspirin? no  Patient confirms/reports the following medications:  Current Outpatient Medications  Medication Sig Dispense Refill  . ALPRAZolam (XANAX) 0.5 MG tablet Take 1 tablet (0.5 mg total) by mouth at bedtime. 30 tablet 5  . FLUoxetine (PROZAC) 20 MG capsule TAKE 3 CAPSULES BY MOUTH EVERY DAY (Patient taking differently: Take 60 mg by mouth daily. ) 270 capsule 4  . gabapentin (NEURONTIN) 300 MG capsule Take 1 capsule (300mg ) by mouth three times daily and take 2 capsules (600mg ) by mouth at bedtime 450 capsule 3  . HYDROcodone-acetaminophen (NORCO) 7.5-325 MG tablet Take 1 tablet by mouth 4 (four) times daily as needed for moderate pain. 120 tablet 0  . NEXIUM 40 MG capsule Take 1 capsule (40 mg total) by mouth daily. 90 capsule 4  . pravastatin (PRAVACHOL) 40 MG tablet TAKE 1 TABLET BY MOUTH EVERYDAY AT BEDTIME 90 tablet 4  . Specialty Vitamins Products ([facility] HAIR/SKIN/NAILS) TABS Take 1 tablet by mouth daily.     . meclizine (ANTIVERT) 25 MG tablet  Take 1 tablet (25 mg total) by mouth 3 (three) times daily as needed for dizziness. (Patient not taking: Reported on 08/02/2020) 30 tablet 1   No current facility-administered medications for this visit.    Patient confirms/reports the following allergies:  Allergies  Allergen Reactions  . Dexilant [Dexlansoprazole] Diarrhea    No orders of the defined types were placed in this encounter.   AUTHORIZATION INFORMATION Primary Insurance: 1D#: Group #:  Secondary Insurance: 1D#: Group #:  SCHEDULE INFORMATION: Date: Monday 08/21/20 Time: Location:[facility]

## 2020-08-15 ENCOUNTER — Other Ambulatory Visit: Payer: Self-pay

## 2020-08-15 ENCOUNTER — Other Ambulatory Visit
Admission: RE | Admit: 2020-08-15 | Discharge: 2020-08-15 | Disposition: A | Discharge status: Home / Self Care | Payer: Medicare Other | Source: Ambulatory Visit | Attending: Gastroenterology | Admitting: Gastroenterology

## 2020-08-15 DIAGNOSIS — Z01812 Encounter for preprocedural laboratory examination: Secondary | ICD-10-CM | POA: Diagnosis not present

## 2020-08-15 DIAGNOSIS — Z20822 Contact with and (suspected) exposure to covid-19: Secondary | ICD-10-CM | POA: Insufficient documentation

## 2020-08-15 LAB — SARS CORONAVIRUS 2 (TAT 6-24 HRS): SARS Coronavirus 2: NEGATIVE

## 2020-08-16 ENCOUNTER — Encounter: Payer: Self-pay | Admitting: Gastroenterology

## 2020-08-16 ENCOUNTER — Telehealth: Payer: Self-pay

## 2020-08-16 NOTE — Telephone Encounter (Signed)
Patient states that no one has called her telling her the change in [facility] has [facility]. Informed patient that Sharyn Lull sent a mychart message to inform her. She states she has a bowel prep at home and has started taking the medication. She states she will be fine to have her procedure tomorrow

## 2020-08-16 NOTE — Telephone Encounter (Signed)
-----   Message from Storm Frisk, [place] sent at 08/16/2020  4:46 PM EDT ----- Regarding: Prep Fran [employer],  Patient left a voicemail stating she does not have the bowel prep for colonoscopy that is scheduled for tomorrow. She said [facility] the office to get prescription but they were unable to reach anyone. Please contact patient at [phone].    Thanks, Jovon

## 2020-08-16 NOTE — Telephone Encounter (Signed)
Pt states when she got to the pharmacy they did not have her ClenPiq rx when she arrived, although it was verbally provided to [facility] the pharmacist.  He even said it was available.  Pt stated that she was advised per the pharmacist to use Gatorade with Miralax Dulcolax bowel prep instead.  I advised her that this prep was not FDA approved and that I would notate that she used this bowel prep.  Thanks,  [place], [place]

## 2020-08-17 ENCOUNTER — Ambulatory Visit: Payer: Medicare Other | Admitting: Anesthesiology

## 2020-08-17 ENCOUNTER — Other Ambulatory Visit: Payer: Self-pay

## 2020-08-17 ENCOUNTER — Encounter
Admission: RE | Disposition: A | Discharge status: Home / Self Care | Payer: Self-pay | Source: Home / Self Care | Attending: Gastroenterology

## 2020-08-17 ENCOUNTER — Ambulatory Visit
Admission: RE | Admit: 2020-08-17 | Discharge: 2020-08-17 | Disposition: A | Discharge status: Home / Self Care | Payer: Medicare Other | Attending: Gastroenterology | Admitting: Gastroenterology

## 2020-08-17 ENCOUNTER — Encounter: Payer: Self-pay | Admitting: Gastroenterology

## 2020-08-17 DIAGNOSIS — G4730 Sleep apnea, unspecified: Secondary | ICD-10-CM | POA: Insufficient documentation

## 2020-08-17 DIAGNOSIS — Z860100 Personal history of colon polyps, unspecified: Secondary | ICD-10-CM

## 2020-08-17 DIAGNOSIS — Z90710 Acquired absence of both cervix and uterus: Secondary | ICD-10-CM | POA: Insufficient documentation

## 2020-08-17 DIAGNOSIS — Z87891 Personal history of nicotine dependence: Secondary | ICD-10-CM | POA: Diagnosis not present

## 2020-08-17 DIAGNOSIS — M1990 Unspecified osteoarthritis, unspecified site: Secondary | ICD-10-CM | POA: Insufficient documentation

## 2020-08-17 DIAGNOSIS — F419 Anxiety disorder, unspecified: Secondary | ICD-10-CM | POA: Insufficient documentation

## 2020-08-17 DIAGNOSIS — D126 Benign neoplasm of colon, unspecified: Secondary | ICD-10-CM | POA: Diagnosis not present

## 2020-08-17 DIAGNOSIS — E785 Hyperlipidemia, unspecified: Secondary | ICD-10-CM | POA: Insufficient documentation

## 2020-08-17 DIAGNOSIS — K635 Polyp of colon: Secondary | ICD-10-CM

## 2020-08-17 DIAGNOSIS — Z888 Allergy status to other drugs, medicaments and biological substances status: Secondary | ICD-10-CM | POA: Diagnosis not present

## 2020-08-17 DIAGNOSIS — Z8249 Family history of ischemic heart disease and other diseases of the circulatory system: Secondary | ICD-10-CM | POA: Insufficient documentation

## 2020-08-17 DIAGNOSIS — Z79899 Other long term (current) drug therapy: Secondary | ICD-10-CM | POA: Diagnosis not present

## 2020-08-17 DIAGNOSIS — Z9049 Acquired absence of other specified parts of digestive tract: Secondary | ICD-10-CM | POA: Insufficient documentation

## 2020-08-17 DIAGNOSIS — Z1211 Encounter for screening for malignant neoplasm of colon: Secondary | ICD-10-CM | POA: Diagnosis not present

## 2020-08-17 DIAGNOSIS — K219 Gastro-esophageal reflux disease without esophagitis: Secondary | ICD-10-CM | POA: Insufficient documentation

## 2020-08-17 DIAGNOSIS — D125 Benign neoplasm of sigmoid colon: Secondary | ICD-10-CM | POA: Diagnosis not present

## 2020-08-17 HISTORY — PX: COLONOSCOPY WITH PROPOFOL: SHX5780

## 2020-08-17 HISTORY — DX: Polyp of colon: K63.5

## 2020-08-17 HISTORY — DX: Sleep apnea, unspecified: G47.30

## 2020-08-17 SURGERY — COLONOSCOPY WITH PROPOFOL
Anesthesia: General

## 2020-08-17 MED ORDER — PHENYLEPHRINE HCL (PRESSORS) 10 MG/ML IV SOLN
INTRAVENOUS | Status: DC | PRN
  Administered 2020-08-17: 50 ug via INTRAVENOUS

## 2020-08-17 MED ORDER — PROPOFOL 500 MG/50ML IV EMUL
INTRAVENOUS | Status: DC | PRN
  Administered 2020-08-17: 150 ug/kg/min via INTRAVENOUS

## 2020-08-17 MED ORDER — PROPOFOL 500 MG/50ML IV EMUL
INTRAVENOUS | Status: AC
  Filled 2020-08-17: qty 50

## 2020-08-17 MED ORDER — SODIUM CHLORIDE 0.9 % IV SOLN: INTRAVENOUS | Status: DC

## 2020-08-17 MED ORDER — PROPOFOL 10 MG/ML IV BOLUS
INTRAVENOUS | Status: DC | PRN
  Administered 2020-08-17: 50 mg via INTRAVENOUS

## 2020-08-17 NOTE — Anesthesia Postprocedure Evaluation (Signed)
Anesthesia Post Note  Patient: Alejandra Little  Procedure(s) Performed: COLONOSCOPY WITH PROPOFOL (N/A )  Patient location during evaluation: Endoscopy Anesthesia Type: General Level of consciousness: awake and alert Pain management: pain level controlled Vital Signs Assessment: post-procedure vital signs reviewed and stable Respiratory status: spontaneous breathing, nonlabored ventilation, respiratory function stable and patient connected to nasal cannula oxygen Cardiovascular status: blood pressure returned to baseline and stable Postop Assessment: no apparent nausea or vomiting Anesthetic complications: no   No complications documented.   Last Vitals:  Vitals:   08/17/20 1024 08/17/20 1034  BP: 113/61 115/85  Pulse:    Resp: 10   Temp:    SpO2: 99%     Last Pain:  Vitals:   08/17/20 1034  TempSrc:   PainSc: 2                  Arita Miss

## 2020-08-17 NOTE — H&P (Signed)
Vonda Antigua, MD [address] [place], [place], [place], [address] [address], [place], [place], [place], [address] Phone: [phone]  Fax: [phone]  Primary Care Physician:  [name], MD   Pre-Procedure History & Physical: HPI:  Alejandra Little is a 64 y.o. female is here for a colonoscopy.   Past Medical History:  Diagnosis Date  . Anxiety   . Arthritis   . Chronic back pain   . Colon polyps   . Dog tapeworm infection 01/11/2016  . GERD (gastroesophageal reflux disease)   . Giardia 01/11/2016  . Hyperlipidemia   . Sleep apnea     Past Surgical History:  Procedure Laterality Date  . ABDOMINAL HYSTERECTOMY  1999   Fibroids  . CHOLECYSTECTOMY  2004  . LITHOTRIPSY      Prior to Admission medications   Medication Sig Start Date End Date Taking? Authorizing Provider  ALPRAZolam Duanne Moron) 0.5 MG tablet Take 1 tablet (0.5 mg total) by mouth at bedtime. 04/11/20  Yes [name], MD  FLUoxetine (PROZAC) 20 MG capsule TAKE 3 CAPSULES BY MOUTH EVERY DAY Patient taking differently: Take 60 mg by mouth daily.  07/11/19  Yes [name], MD  gabapentin (NEURONTIN) 300 MG capsule Take 1 capsule (300mg ) by mouth three times daily and take 2 capsules (600mg ) by mouth at bedtime 12/27/19  Yes [name], MD  HYDROcodone-acetaminophen (NORCO) 7.5-325 MG tablet Take 1 tablet by mouth 4 (four) times daily as needed for moderate pain. 07/21/20  Yes [name], MD  NEXIUM 40 MG capsule Take 1 capsule (40 mg total) by mouth daily. 06/23/20  Yes [name], MD  pravastatin (PRAVACHOL) 40 MG tablet TAKE 1 TABLET BY MOUTH EVERYDAY AT BEDTIME 02/05/20  Yes [name], MD  Specialty Vitamins Products ([facility] HAIR/SKIN/NAILS) TABS Take 1 tablet by mouth daily.    Yes [provider]  meclizine (ANTIVERT) 25 MG tablet Take 1 tablet (25 mg total) by mouth 3 (three) times daily as needed for dizziness. Patient not taking: Reported on 08/02/2020  07/26/20   Trinna Post, PA-C    Allergies as of 08/02/2020 - Review Complete 08/02/2020  Allergen Reaction Noted  . Dexilant [dexlansoprazole] Diarrhea 06/23/2020    Family History  Problem Relation Age of Onset  . Lung cancer Mother   . Lung cancer Father   . Hypertension Sister   . COPD Sister   . COPD Sister     Social History   Socioeconomic History  . Marital status: Divorced    Spouse name: Not on file  . Number of children: 3  . Years of education: 8  . Highest education level: 8th grade  Occupational History  . Occupation: Disability  Tobacco Use  . Smoking status: Former Smoker    Packs/day: 1.00    Years: 24.00    Pack years: 24.00    Types: Cigarettes    Start date: 74    Quit date: 11/08/2015    Years since quitting: 4.7  . Smokeless tobacco: Never Used  Vaping Use  . Vaping Use: Never used  Substance and Sexual Activity  . Alcohol use: No  . Drug use: Never  . Sexual activity: Yes  Other Topics Concern  . Not on file  Social History Narrative  . Not on file   Social Determinants of Health   Financial Resource Strain: Low Risk   . Difficulty of Paying Living Expenses: Not hard at all  Food Insecurity: No Food Insecurity  . Worried About Charity fundraiser in the Last Year: Never true  . Ran Out of Food in the Last Year: Never true  Transportation Needs: No Transportation Needs  . Lack of Transportation (Medical): No  . Lack of Transportation (Non-Medical): No  Physical Activity: Inactive  . Days of Exercise per Week: 0 days  . Minutes of Exercise per Session: 0 min  Stress: No Stress Concern Present  . Feeling of Stress : Not at all  Social Connections: Socially Isolated  . Frequency of Communication with Friends and Family: More than three times a week  . Frequency of Social Gatherings with Friends and Family: More than three times a week  . Attends Religious Services: Never  . Active Member of Clubs or Organizations: No  .  Attends Archivist Meetings: Never  . Marital Status: Divorced  Human resources officer Violence: Not At Risk  . Fear of Current or Ex-Partner: No  . Emotionally Abused: No  . Physically Abused: No  . Sexually Abused: No    Review of Systems: See HPI, otherwise negative ROS  Physical Exam: BP (!) 145/85   Pulse 77   Temp 97.6 F (36.4 C) (Temporal)   Resp (!) 21   Ht 5\' 5"  (1.651 m)   Wt 96.6 kg   SpO2 97%   BMI 35.45 kg/m  General:   Alert,  pleasant and cooperative in NAD Head:  Normocephalic and atraumatic. Neck:  Supple; no masses or thyromegaly. Lungs:  Clear throughout to auscultation, normal respiratory effort.    Heart:  +S1, +S2, Regular rate and rhythm, No edema. Abdomen:  Soft, nontender and nondistended. Normal bowel sounds, without guarding, and without rebound.   Neurologic:  Alert and  oriented x4;  grossly normal neurologically.  Impression/Plan: Alejandra Little is here for a colonoscopy to be performed for history of polyps. Colonoscopy with dr. Tiffany Kocher in 2014 did not show polyps. 2008 with 2 subcentimeter polyps removed from the descending colon, pathology report not available  Risks, benefits, limitations, and alternatives regarding  colonoscopy have been reviewed with the patient.  Questions have been answered.  All parties agreeable.   Virgel Manifold, MD  08/17/2020, 9:43 AM

## 2020-08-17 NOTE — Op Note (Signed)
[facility] Gastroenterology Patient Name: Alejandra Little Procedure Date: 08/17/2020 9:44 AM MRN: [id number] Account #: [id number] Date of Birth: Apr 04, 1956 Admit Type: Outpatient Age: 64 Room: [facility] ENDO ROOM 1 Gender: Female Note Status: Finalized Procedure:             Colonoscopy Indications:           High risk colon cancer surveillance: Personal history                         of colonic polyps Providers:             Andrews Tener B. Bonna Gains MD, MD Referring MD:          Kirstie Peri. Caryn Section, MD (Referring MD) Medicines:             Monitored Anesthesia Care Complications:         No immediate complications. Procedure:             Pre-Anesthesia Assessment:                        - ASA Grade Assessment: II - A patient with mild                         systemic disease.                        - Prior to the procedure, a History and Physical was                         performed, and patient medications, allergies and                         sensitivities were reviewed. The patient's tolerance                         of previous anesthesia was reviewed.                        - The risks and benefits of the procedure and the                         sedation options and risks were discussed with the                         patient. All questions were answered and informed                         consent was obtained.                        - Patient identification and proposed procedure were                         verified prior to the procedure by the physician, the                         nurse, the anesthesiologist, the anesthetist and the                         technician. The procedure  was verified in the                         procedure room.                        After obtaining informed consent, the colonoscope was                         passed under direct vision. Throughout the procedure,                         the patient's blood pressure, pulse, and  oxygen                         saturations were monitored continuously. The                         Colonoscope was introduced through the anus and                         advanced to the the cecum, identified by appendiceal                         orifice and ileocecal valve. The colonoscopy was                         performed with ease. The patient tolerated the                         procedure well. The quality of the bowel preparation                         was good. Findings:      The perianal and digital rectal examinations were normal.      A 4 mm polyp was found in the sigmoid colon. The polyp was flat. The       polyp was removed with a jumbo cold forceps. Resection and retrieval       were complete.      The exam was otherwise without abnormality.      The rectum, sigmoid colon, descending colon, transverse colon, ascending       colon and cecum appeared normal.      The retroflexed view of the distal rectum and anal verge was normal and       showed no anal or rectal abnormalities. Impression:            - One 4 mm polyp in the sigmoid colon, removed with a                         jumbo cold forceps. Resected and retrieved.                        - The examination was otherwise normal.                        - The rectum, sigmoid colon, descending colon,                         transverse colon, ascending  colon and cecum are normal.                        - The distal rectum and anal verge are normal on                         retroflexion view. Recommendation:        - Await pathology results.                        - Discharge patient to home (with escort).                        - Advance diet as tolerated.                        - Continue present medications.                        - Repeat colonoscopy in 5 years.                        - The findings and recommendations were discussed with                         the patient.                        - The findings  and recommendations were discussed with                         the patient's family.                        - Return to primary care physician as previously                         scheduled. Procedure Code(s):     --- Professional ---                        [phone], Colonoscopy, flexible; with biopsy, single or                         multiple Diagnosis Code(s):     --- Professional ---                        Z86.010, Personal history of colonic polyps                        K63.5, Polyp of colon CPT copyright 2019 American Medical Association. All rights reserved. The codes documented in this report are preliminary and upon coder review may  be revised to meet current compliance requirements.  Vonda Antigua, MD Margretta Sidle B. Bonna Gains MD, MD 08/17/2020 10:23:56 AM This report has been signed electronically. Number of Addenda: 0 Note Initiated On: 08/17/2020 9:44 AM Scope Withdrawal Time: 0 hours 13 minutes 6 seconds  Total Procedure Duration: 0 hours 19 minutes 4 seconds  Estimated Blood Loss:  Estimated blood loss: none.      [facility]

## 2020-08-17 NOTE — Transfer of Care (Signed)
Immediate Anesthesia Transfer of Care Note  Patient: Alejandra Little  Procedure(s) Performed: COLONOSCOPY WITH PROPOFOL (N/A )  Patient Location: PACU  Anesthesia Type:General  Level of Consciousness: awake and alert   Airway & Oxygen Therapy: Patient Spontanous Breathing and Patient connected to nasal cannula oxygen  Post-op Assessment: Report given to RN and Post -op Vital signs reviewed and stable  Post vital signs: Reviewed and stable  Last Vitals:  Vitals Value Taken Time  BP 113/61 08/17/20 1027  Temp 36.2 C 08/17/20 1024  Pulse 81 08/17/20 1029  Resp 15 08/17/20 1029  SpO2 97 % 08/17/20 1029  Vitals shown include unvalidated device data.  Last Pain:  Vitals:   08/17/20 1024  TempSrc: Temporal  PainSc: Asleep         Complications: No complications documented.

## 2020-08-17 NOTE — Anesthesia Preprocedure Evaluation (Addendum)
Anesthesia Evaluation  Patient identified by MRN, date of birth, ID band Patient awake    Reviewed: Allergy & Precautions, NPO status , Patient's Chart, lab work & pertinent test results  History of Anesthesia Complications Negative for: history of anesthetic complications  Airway Mallampati: II  TM Distance: >3 FB Neck ROM: Full    Dental no notable dental hx. (+) Teeth Intact   Pulmonary sleep apnea , neg COPD, Patient abstained from smoking.Not current smoker, former smoker,  Patient states she has severe OSA, does not use cpap   Pulmonary exam normal breath sounds clear to auscultation       Cardiovascular Exercise Tolerance: Good METS(-) hypertension(-) CAD and (-) Past MI (-) dysrhythmias  Rhythm:Regular Rate:Normal - Systolic murmurs    Neuro/Psych PSYCHIATRIC DISORDERS Anxiety negative neurological ROS     GI/Hepatic GERD  ,(+)     (-) substance abuse  , Patient says she has chronic bloating including today. Denies nausea, vomiting.   Endo/Other  neg diabetes  Renal/GU negative Renal ROS     Musculoskeletal   Abdominal   Peds  Hematology   Anesthesia Other Findings Past Medical History: No date: Anxiety No date: Arthritis No date: Chronic back pain No date: Colon polyps 01/11/2016: Dog tapeworm infection No date: GERD (gastroesophageal reflux disease) 01/11/2016: Giardia No date: Hyperlipidemia No date: Sleep apnea  Reproductive/Obstetrics                             Anesthesia Physical Anesthesia Plan  ASA: II  Anesthesia Plan: General   Post-op Pain Management:    Induction: Intravenous  PONV Risk Score and Plan: 3 and Ondansetron, Propofol infusion and TIVA  Airway Management Planned: Nasal CPAP  Additional Equipment: None  Intra-op Plan:   Post-operative Plan:   Informed Consent: I have reviewed the patients History and Physical, chart, labs and  discussed the procedure including the risks, benefits and alternatives for the proposed anesthesia with the patient or authorized representative who has indicated his/her understanding and acceptance.     Dental advisory given  Plan Discussed with: CRNA and Surgeon  Anesthesia Plan Comments: (Discussed risks of anesthesia with patient, including possibility of difficulty with spontaneous ventilation under anesthesia necessitating airway intervention, PONV, and rare risks such as cardiac or respiratory or neurological events. Patient understands.)       Anesthesia Quick Evaluation

## 2020-08-18 ENCOUNTER — Encounter: Payer: Self-pay | Admitting: Gastroenterology

## 2020-08-18 LAB — SURGICAL PATHOLOGY

## 2020-08-21 ENCOUNTER — Other Ambulatory Visit: Payer: Self-pay | Admitting: Family Medicine

## 2020-08-21 MED ORDER — HYDROCODONE-ACETAMINOPHEN 7.5-325 MG PO TABS
1.0000 | ORAL_TABLET | Freq: Four times a day (QID) | ORAL | 0 refills | Status: DC | PRN
Start: 2020-08-21 — End: 2020-09-20

## 2020-08-21 NOTE — Telephone Encounter (Signed)
Requested medication (s) are due for refill today: yes  Requested medication (s) are on the active medication list: yes  Last refill:  07/21/20  Future visit scheduled: no  Notes to clinic:  med not delegated to NT to RF   Requested Prescriptions  Pending Prescriptions Disp Refills   HYDROcodone-acetaminophen (NORCO) 7.5-325 MG tablet 120 tablet 0    Sig: Take 1 tablet by mouth 4 (four) times daily as needed for moderate pain.      Not Delegated - Analgesics:  Opioid Agonist Combinations Failed - 08/21/2020  9:12 AM      Failed - This refill cannot be delegated      Failed - Urine Drug Screen completed in last 360 days.      Passed - Valid encounter within last 6 months    Recent Outpatient Visits           3 weeks ago [name], [name], [name]   2 months ago Other fatigue   [facility] [name], MD   3 months ago Upper respiratory tract infection, unspecified type   [facility] [name], MD   7 months ago Vertigo   [facility] [name], MD   8 months ago Anxiety   [facility] [name], MD

## 2020-08-21 NOTE — Telephone Encounter (Signed)
Patient requesting HYDROcodone-acetaminophen ([place]) 7.5-325 MG tablet, informed please allow 48 to 72 hour turn around time.   [id number] [place], [place] - [address] Phone:  [phone]  Fax:  [phone]

## 2020-08-29 DIAGNOSIS — R42 Dizziness and giddiness: Secondary | ICD-10-CM | POA: Diagnosis not present

## 2020-08-29 DIAGNOSIS — H903 Sensorineural hearing loss, bilateral: Secondary | ICD-10-CM | POA: Diagnosis not present

## 2020-08-29 DIAGNOSIS — G4733 Obstructive sleep apnea (adult) (pediatric): Secondary | ICD-10-CM | POA: Diagnosis not present

## 2020-08-31 ENCOUNTER — Encounter: Payer: Self-pay | Admitting: Gastroenterology

## 2020-09-09 ENCOUNTER — Other Ambulatory Visit: Payer: Self-pay | Admitting: Family Medicine

## 2020-09-09 DIAGNOSIS — F419 Anxiety disorder, unspecified: Secondary | ICD-10-CM

## 2020-09-09 NOTE — Telephone Encounter (Signed)
Requested Prescriptions  Pending Prescriptions Disp Refills  . FLUoxetine (PROZAC) 20 MG capsule [Pharmacy Med Name: FLUOXETINE HCL 20 MG CAPSULE] 270 capsule 1    Sig: TAKE 3 CAPSULES BY MOUTH EVERY DAY     Psychiatry:  Antidepressants - SSRI Passed - 09/09/2020  8:52 AM      Passed - Valid encounter within last 6 months    Recent Outpatient Visits          1 month ago [place], [place], [name]   3 months ago Other fatigue   [facility] [name], MD   3 months ago Upper respiratory tract infection, unspecified type   [facility] [name], MD   8 months ago Vertigo   [facility] [name], MD   9 months ago Anxiety   [facility] [name], MD

## 2020-09-11 ENCOUNTER — Other Ambulatory Visit: Payer: Self-pay | Admitting: Family Medicine

## 2020-09-11 DIAGNOSIS — F419 Anxiety disorder, unspecified: Secondary | ICD-10-CM

## 2020-09-11 MED ORDER — ALPRAZOLAM 0.5 MG PO TABS: 0.5000 mg | ORAL_TABLET | Freq: Every day | ORAL | 5 refills | Status: DC

## 2020-09-11 NOTE — Telephone Encounter (Signed)
Pt need a refill  ALPRAZolam (XANAX) 0.5 MG tablet [[id number]]  [id number] [place], [place] - [address]  [address] [place] [address]  Phone: [phone] Fax: [phone]

## 2020-09-11 NOTE — Telephone Encounter (Signed)
Requested medication (s) are due for refill today: No. Due 10/12/20  Requested medication (s) are on the active medication list:  yes  Last refill: 04/11/20  #30  5 refills  Future visit scheduled no  Notes to clinic: Not delegated. Attempted to reach pt regarding early request, left VM to CB.  Requested Prescriptions  Pending Prescriptions Disp Refills   ALPRAZolam (XANAX) 0.5 MG tablet 30 tablet 5    Sig: Take 1 tablet (0.5 mg total) by mouth at bedtime.      Not Delegated - Psychiatry:  Anxiolytics/Hypnotics Failed - 09/11/2020  9:59 AM      Failed - This refill cannot be delegated      Failed - Urine Drug Screen completed in last 360 days.      Passed - Valid encounter within last 6 months    Recent Outpatient Visits           1 month ago [place], [place], [name]   3 months ago Other fatigue   [facility] [name], [facility]   3 months ago Upper respiratory tract infection, unspecified type   [facility] [name], MD   8 months ago Vertigo   [facility] [name], MD   9 months ago Anxiety   [facility] [name], MD

## 2020-09-13 ENCOUNTER — Ambulatory Visit: Payer: Medicare Other | Attending: Otolaryngology

## 2020-09-13 DIAGNOSIS — G4733 Obstructive sleep apnea (adult) (pediatric): Secondary | ICD-10-CM | POA: Insufficient documentation

## 2020-09-14 ENCOUNTER — Other Ambulatory Visit: Payer: Self-pay

## 2020-09-20 ENCOUNTER — Other Ambulatory Visit: Payer: Self-pay | Admitting: Family Medicine

## 2020-09-20 MED ORDER — HYDROCODONE-ACETAMINOPHEN 7.5-325 MG PO TABS
1.0000 | ORAL_TABLET | Freq: Four times a day (QID) | ORAL | 0 refills | Status: DC | PRN
Start: 2020-09-20 — End: 2020-10-19

## 2020-09-20 NOTE — Telephone Encounter (Signed)
Requested medication (s) are due for refill today: yes  Requested medication (s) are on the active medication list: yes  Last refill:  08/21/2020  Future visit scheduled: no  Notes to clinic:  this refill cannot be delegated  Requested Prescriptions  Pending Prescriptions Disp Refills   HYDROcodone-acetaminophen (NORCO) 7.5-325 MG tablet 120 tablet 0    Sig: Take 1 tablet by mouth 4 (four) times daily as needed for moderate pain.      Not Delegated - Analgesics:  Opioid Agonist Combinations Failed - 09/20/2020  9:52 AM      Failed - This refill cannot be delegated      Failed - Urine Drug Screen completed in last 360 days      Passed - Valid encounter within last 6 months    Recent Outpatient Visits           1 month ago [place], [place], [name]   3 months ago Other fatigue   [facility] [name], MD   4 months ago Upper respiratory tract infection, unspecified type   [facility] [name], MD   8 months ago Vertigo   [facility] [name], MD   9 months ago Anxiety   [facility] [name], MD

## 2020-09-20 NOTE — Telephone Encounter (Signed)
PT need a refill  HYDROcodone-acetaminophen (NORCO) 7.5-325 MG tablet [[id number]]  [id number] [place], [place] - [address]  [address] [place] [address]  Phone: [phone] Fax: [phone]

## 2020-09-30 DIAGNOSIS — Z23 Encounter for immunization: Secondary | ICD-10-CM | POA: Diagnosis not present

## 2020-10-16 ENCOUNTER — Other Ambulatory Visit: Payer: Self-pay | Admitting: Family Medicine

## 2020-10-19 ENCOUNTER — Telehealth: Payer: Self-pay | Admitting: Family Medicine

## 2020-10-19 MED ORDER — HYDROCODONE-ACETAMINOPHEN 7.5-325 MG PO TABS
1.0000 | ORAL_TABLET | Freq: Four times a day (QID) | ORAL | 0 refills | Status: DC | PRN
Start: 2020-10-19 — End: 2020-11-21

## 2020-10-19 NOTE — Telephone Encounter (Signed)
Requested medication (s) are due for refill today: yes   Requested medication (s) are on the active medication list: yes  Last refill: 09/20/2020  Future visit scheduled: yes   Notes to clinic:  this refill cannot be delegated    Requested Prescriptions  Pending Prescriptions Disp Refills   HYDROcodone-acetaminophen (NORCO) 7.5-325 MG tablet 120 tablet 0    Sig: Take 1 tablet by mouth 4 (four) times daily as needed for moderate pain.      Not Delegated - Analgesics:  Opioid Agonist Combinations Failed - 10/19/2020  9:43 AM      Failed - This refill cannot be delegated      Failed - Urine Drug Screen completed in last 360 days      Passed - Valid encounter within last 6 months    Recent Outpatient Visits           2 months ago [place], [place], [name]   4 months ago Other fatigue   [facility] [name], MD   5 months ago Upper respiratory tract infection, unspecified type   [facility] [name], MD   9 months ago [place], Donald E, MD   10 months ago [place], Donald E, MD       Future Appointments             In 1 month Fisher, Kirstie Peri, MD [facility], [place]

## 2020-10-19 NOTE — Telephone Encounter (Signed)
PT need a refill  HYDROcodone-acetaminophen ([place]) 7.5-325 MG tablet [[id number]]  [id number] [place], [place] - [address]  [address] [place] [address]  Phone: [phone] Fax: [phone]

## 2020-11-21 ENCOUNTER — Other Ambulatory Visit: Payer: Self-pay | Admitting: Family Medicine

## 2020-11-21 ENCOUNTER — Telehealth: Payer: Self-pay

## 2020-11-21 MED ORDER — HYDROCODONE-ACETAMINOPHEN 7.5-325 MG PO TABS
1.0000 | ORAL_TABLET | Freq: Four times a day (QID) | ORAL | 0 refills | Status: DC | PRN
Start: 2020-11-21 — End: 2020-12-25

## 2020-11-21 NOTE — Telephone Encounter (Signed)
Requested medication (s) are due for refill today: yes  Requested medication (s) are on the active medication list: yes  Last refill:  10/19/2020  Future visit scheduled: yes  Notes to clinic:  this refill cannot be delegated    Requested Prescriptions  Pending Prescriptions Disp Refills   HYDROcodone-acetaminophen (NORCO) 7.5-325 MG tablet 120 tablet 0    Sig: Take 1 tablet by mouth 4 (four) times daily as needed for moderate pain.      There is no refill protocol information for this order

## 2020-11-21 NOTE — Telephone Encounter (Signed)
Copied from CRM [id number]. Topic: Quick Communication - Rx Refill/Question >> Nov 21, 2020  1:30 PM Jaquita Rector A wrote: Medication: HYDROcodone-acetaminophen (NORCO) 7.5-325 MG tablet   Has the patient contacted their pharmacy? Yes.   (Agent: If no, request that the patient contact the pharmacy for the refill.) (Agent: If yes, when and what did the pharmacy advise?)  Preferred Pharmacy (with phone number or street name): [id number] [place], [place] - [address]  Phone:  [phone] Fax:  [phone]     Agent: Please be advised that RX refills may take up to 3 business days. We ask that you follow-up with your pharmacy.

## 2020-11-21 NOTE — Telephone Encounter (Signed)
Copied from CRM [id number]. Topic: General - Other >> Nov 21, 2020  1:31 PM Jaquita Rector A wrote: Reason for CRM: Patient want to inquire of Dr Sherrie Mustache if her upcoming appointment can be a virtual visit since she fears getting Covid if she come into the office. Please call Ph# [phone]

## 2020-11-21 NOTE — Telephone Encounter (Signed)
Patient advised. Appointment changed to virtual.

## 2020-11-21 NOTE — Telephone Encounter (Signed)
That's fine

## 2020-12-05 ENCOUNTER — Telehealth (INDEPENDENT_AMBULATORY_CARE_PROVIDER_SITE_OTHER): Payer: Medicare Other | Admitting: Family Medicine

## 2020-12-05 ENCOUNTER — Encounter: Payer: Self-pay | Admitting: Family Medicine

## 2020-12-05 ENCOUNTER — Other Ambulatory Visit: Payer: Self-pay

## 2020-12-05 DIAGNOSIS — F0631 Mood disorder due to known physiological condition with depressive features: Secondary | ICD-10-CM | POA: Diagnosis not present

## 2020-12-05 DIAGNOSIS — F5104 Psychophysiologic insomnia: Secondary | ICD-10-CM | POA: Diagnosis not present

## 2020-12-05 DIAGNOSIS — G4733 Obstructive sleep apnea (adult) (pediatric): Secondary | ICD-10-CM

## 2020-12-05 DIAGNOSIS — F419 Anxiety disorder, unspecified: Secondary | ICD-10-CM | POA: Diagnosis not present

## 2020-12-05 DIAGNOSIS — E7800 Pure hypercholesterolemia, unspecified: Secondary | ICD-10-CM

## 2020-12-05 DIAGNOSIS — M25552 Pain in left hip: Secondary | ICD-10-CM

## 2020-12-05 DIAGNOSIS — M25551 Pain in right hip: Secondary | ICD-10-CM

## 2020-12-05 MED ORDER — ALPRAZOLAM 0.5 MG PO TABS: 0.5000 mg | ORAL_TABLET | Freq: Every day | ORAL | 5 refills | Status: DC

## 2020-12-05 NOTE — Progress Notes (Signed)
Virtual telephone visit    Virtual Visit via Telephone Note   This visit type was conducted due to national recommendations for restrictions regarding the COVID-19 Pandemic (e.g. social distancing) in an effort to limit this patient's exposure and mitigate transmission in our community. Due to her co-morbid illnesses, this patient is at least at moderate risk for complications without adequate follow up. This format is felt to be most appropriate for this patient at this time. The patient did not have access to video technology or had technical difficulties with video requiring transitioning to audio format only (telephone). Physical exam was limited to content and character of the telephone converstion.    Patient location: [place]fp Provider location: home  I discussed the limitations of evaluation and management by telemedicine and the availability of in person appointments. The patient expressed understanding and agreed to proceed.   Visit Date: 12/05/2020  Today's healthcare provider: Lelon Huh, MD   Chief Complaint  Patient presents with  . Anxiety  . Hyperlipidemia  . Sleep Apnea   Subjective    HPI  Anxiety, Follow-up  She was last seen for anxiety 1 year ago. Changes made at last visit include none; continue same medications.   She reports good compliance with treatment. She reports good tolerance of treatment. She is not having side effects.   She feels her anxiety is moderate and Unchanged since last visit.  Symptoms: No chest pain No difficulty concentrating  No dizziness No fatigue  No feelings of losing control No insomnia  No irritable No palpitations  No panic attacks No racing thoughts  No shortness of breath No sweating  No tremors/shakes    GAD-7 Results GAD-7 Generalized Anxiety Disorder Screening Tool 12/05/2020  1. Feeling Nervous, Anxious, or on Edge 1  2. Not Being Able to Stop or Control Worrying 0  3. Worrying Too Much About Different  Things 1  4. Trouble Relaxing 0  5. Being So Restless it's Hard To Sit Still 1  6. Becoming Easily Annoyed or Irritable 1  7. Feeling Afraid As If Something Awful Might Happen 0  Total GAD-7 Score 4  Difficulty At Work, Home, or Getting  Along With Others? Not difficult at all    PHQ-9 Scores PHQ9 SCORE ONLY 12/05/2020 05/08/2020 05/05/2019  PHQ-9 Total Score 7 6 7    She take alprazolam every night to help get to sleep, but is still sometimes unable to sleep and would like to know if she can two every once in awhile.  ---------------------------------------------------------------------------------------------------  Lipid/Cholesterol, Follow-up  Last lipid panel Other pertinent labs  Lab Results  Component Value Date   CHOL 208 (H) 05/29/2020   HDL 45 05/29/2020   LDLCALC 132 (H) 05/29/2020   TRIG 171 (H) 05/29/2020   CHOLHDL 4.6 (H) 05/29/2020   Lab Results  Component Value Date   ALT 12 05/29/2020   AST 12 05/29/2020   PLT 155 05/29/2020   TSH 1.470 05/29/2020     She was last seen for this 6 months ago.  Management since that visit includes advising patient to cut back on saturated fats in diet.  She reports good compliance with treatment. She is not having side effects.   Symptoms: No chest pain No chest pressure/discomfort  No dyspnea No lower extremity edema  No numbness or tingling of extremity No orthopnea  No palpitations No paroxysmal nocturnal dyspnea  No speech difficulty No syncope   Current diet: well balanced Current exercise: none  The 10-year  ASCVD risk score Mikey Bussing DC Jr., et al., 2013) is: 5.6%  ---------------------------------------------------------------------------------------------------  Follow up for Sleep apnea:  The patient was last seen for this 6 months ago. Changes made at last visit include ordering home sleep test.  Patient had the home sleep study done, subsequently seen Dr. Pryor Ochoa and had CPAP ordered back in October, but  apparently she hasn't been able to get it yet. She does have an appt with Dr. Martha Clan later this week.    -----------------------------------------------------------------------------------------       Medications: Outpatient Medications Prior to Visit  Medication Sig  . ALPRAZolam (XANAX) 0.5 MG tablet Take 1 tablet (0.5 mg total) by mouth at bedtime.  Marland Kitchen FLUoxetine (PROZAC) 20 MG capsule TAKE 3 CAPSULES BY MOUTH EVERY DAY  . gabapentin (NEURONTIN) 300 MG capsule Take 1 capsule (300mg ) by mouth three times daily and take 2 capsules (600mg ) by mouth at bedtime  . HYDROcodone-acetaminophen (NORCO) 7.5-325 MG tablet Take 1 tablet by mouth 4 (four) times daily as needed for moderate pain.  Marland Kitchen NEXIUM 40 MG capsule Take 1 capsule (40 mg total) by mouth daily.  . pravastatin (PRAVACHOL) 40 MG tablet TAKE 1 TABLET BY MOUTH EVERYDAY AT BEDTIME  . Specialty Vitamins Products ([facility] HAIR/SKIN/NAILS) TABS Take 1 tablet by mouth daily.   . [DISCONTINUED] meclizine (ANTIVERT) 25 MG tablet Take 1 tablet (25 mg total) by mouth 3 (three) times daily as needed for dizziness. (Patient not taking: No sig reported)   No facility-administered medications prior to visit.    Review of Systems    Objective    There were no vitals taken for this visit.     Assessment & Plan     1. 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   2. Anxiety Continue fluoxetine, only needing alprazolam in the evening to help sleep.    3. Obstructive sleep apnea syndrome Is still waiting for CPAP ordered by Dr. Pryor Ochoa, she has follow up with him later this week.   4. Pain of both hip joints Doing fairly well with current dose of hydrocodone/apap   5. Pure hypercholesterolemia She is tolerating pravastatin well with no adverse effects.  Is to follow up in the summer for labs.   6. Psychophysiological insomnia Advised she can take a second  alprazolam occasionally.       I discussed the assessment and treatment plan with the patient. The patient was provided an opportunity to ask questions and all were answered. The patient agreed with the plan and demonstrated an understanding of the instructions.   The patient was advised to call back or seek an in-person evaluation if the symptoms worsen or if the condition fails to improve as anticipated.  I provided 14 minutes of non-face-to-face time during this encounter.  The entirety of the information documented in the History of Present Illness, Review of Systems and Physical Exam were personally obtained by me. Portions of this information were initially documented by the CMA and reviewed by me for thoroughness and accuracy.    Lelon Huh, MD [facility] [phone] (phone) [phone] (fax)  [place]

## 2020-12-25 ENCOUNTER — Other Ambulatory Visit: Payer: Self-pay | Admitting: Family Medicine

## 2020-12-25 MED ORDER — HYDROCODONE-ACETAMINOPHEN 7.5-325 MG PO TABS
1.0000 | ORAL_TABLET | Freq: Four times a day (QID) | ORAL | 0 refills | Status: DC | PRN
Start: 2020-12-25 — End: 2021-01-25

## 2020-12-25 NOTE — Telephone Encounter (Signed)
Requested medication (s) are due for refill today - yes  Requested medication (s) are on the active medication list -yes  Future visit scheduled -yes  Last refill: 11/21/20  Notes to clinic: Request non delegated rx  Requested Prescriptions  Pending Prescriptions Disp Refills   HYDROcodone-acetaminophen (NORCO) 7.5-325 MG tablet 120 tablet 0    Sig: Take 1 tablet by mouth 4 (four) times daily as needed for moderate pain.      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   [facility] [name], MD   5 months ago [place], [name]   7 months ago Other fatigue   [facility] [name], MD   7 months ago Upper respiratory tract infection, unspecified type   [facility] [name], MD   12 months ago [place], [place]       Future Appointments             In 5 months Fisher, Kirstie Peri, MD [facility]                 Requested Prescriptions  Pending Prescriptions Disp Refills   HYDROcodone-acetaminophen (NORCO) 7.5-325 MG tablet 120 tablet 0    Sig: Take 1 tablet by mouth 4 (four) times daily as needed for moderate pain.      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 [name], MD   12 months ago [place], Donald E, MD       Future Appointments             In 5 months Fisher, Kirstie Peri, MD [facility], [place]

## 2020-12-25 NOTE — Telephone Encounter (Signed)
Medication Refill - Medication: Hydrocodone ° °Has the patient contacted their pharmacy? No. °(Agent: If no, request that the patient contact the pharmacy for the refill.) °(Agent: If yes, when and what did the pharmacy advise?) ° °Preferred Pharmacy (with phone number or street name): [id number] - [place], [place] - [address] ° °Agent: Please be advised that RX refills may take up to 3 business days. We ask that you follow-up with your pharmacy. °

## 2020-12-27 DIAGNOSIS — Z20822 Contact with and (suspected) exposure to covid-19: Secondary | ICD-10-CM | POA: Diagnosis not present

## 2021-01-03 ENCOUNTER — Other Ambulatory Visit: Payer: Self-pay | Admitting: Family Medicine

## 2021-01-03 DIAGNOSIS — K219 Gastro-esophageal reflux disease without esophagitis: Secondary | ICD-10-CM

## 2021-01-03 DIAGNOSIS — F419 Anxiety disorder, unspecified: Secondary | ICD-10-CM

## 2021-01-25 ENCOUNTER — Other Ambulatory Visit: Payer: Self-pay | Admitting: Family Medicine

## 2021-01-25 NOTE — Telephone Encounter (Signed)
Copied from [place] [id number]. Topic: Quick Communication - Rx Refill/Question >> Jan 25, 2021  4:55 PM Tessa Lerner A wrote: Medication: HYDROcodone-acetaminophen (NORCO) 7.5-325 MG tablet   Has the patient contacted their pharmacy? No. Patient has not contacted pharmacy due to the classification of this medication   Preferred Pharmacy (with phone number or street name): [id number] [place], [place] - [address]  Phone:  [phone]  Agent: Please be advised that RX refills may take up to 3 business days. We ask that you follow-up with your pharmacy.

## 2021-01-25 NOTE — Telephone Encounter (Signed)
Requested medication (s) are due for refill today: yes  Requested medication (s) are on the active medication list: yes  Last refill:  12/25/20 #120 0 refills  Future visit scheduled: yes  Notes to clinic:  not delegated per protocol     Requested Prescriptions  Pending Prescriptions Disp Refills   HYDROcodone-acetaminophen (NORCO) 7.5-325 MG tablet 120 tablet 0    Sig: Take 1 tablet by mouth 4 (four) times daily as needed for moderate pain.      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

## 2021-01-26 MED ORDER — HYDROCODONE-ACETAMINOPHEN 7.5-325 MG PO TABS
1.0000 | ORAL_TABLET | Freq: Four times a day (QID) | ORAL | 0 refills | Status: DC | PRN
Start: 2021-01-26 — End: 2021-02-27

## 2021-02-19 ENCOUNTER — Other Ambulatory Visit: Payer: Self-pay | Admitting: Family Medicine

## 2021-02-19 DIAGNOSIS — E785 Hyperlipidemia, unspecified: Secondary | ICD-10-CM

## 2021-02-27 ENCOUNTER — Telehealth: Payer: Self-pay | Admitting: Family Medicine

## 2021-02-27 NOTE — Telephone Encounter (Signed)
Copied from [place] [id number]. Topic: Quick Communication - Rx Refill/Question >> Feb 27, 2021 11:25 AM Leward Quan A wrote: Medication: HYDROcodone-acetaminophen ([place]) 7.5-325 MG tablet   Has the patient contacted their pharmacy? Yes.   (Agent: If no, request that the patient contact the pharmacy for the refill.) (Agent: If yes, when and what did the pharmacy advise?)  Preferred Pharmacy (with phone number or street name): [id number] [place], [place] - [address]  Phone:  [phone] Fax:  [phone]     Agent: Please be advised that RX refills may take up to 3 business days. We ask that you follow-up with your pharmacy.

## 2021-02-27 NOTE — Telephone Encounter (Signed)
Requested medication (s) are due for refill today: yes   Requested medication (s) are on the active medication list: yes  Last refill: 01/26/2021  Future visit scheduled: yes   Notes to clinic:  this refill cannot be delegated    Requested Prescriptions  Pending Prescriptions Disp Refills   HYDROcodone-acetaminophen (NORCO) 7.5-325 MG tablet 120 tablet 0    Sig: Take 1 tablet by mouth 4 (four) times daily as needed for moderate pain.      There is no refill protocol information for this order

## 2021-02-28 MED ORDER — HYDROCODONE-ACETAMINOPHEN 7.5-325 MG PO TABS
1.0000 | ORAL_TABLET | Freq: Four times a day (QID) | ORAL | 0 refills | Status: DC | PRN
Start: 2021-02-28 — End: 2021-04-09

## 2021-03-02 ENCOUNTER — Ambulatory Visit: Payer: Self-pay | Admitting: *Deleted

## 2021-03-02 NOTE — Telephone Encounter (Signed)
Summary: Please advise   Pt has an appt with dr Caryn Section for July 2022 and does not want to make sooner appt. Pt has been having no energy for 1 yrs. Pt had sleep study done and suppose to be on cpap machine. Pt Will call dr Sharol Roussel office to check on cpap      Call to patient- patient states she is not sleeping, has chronic pain back/knee that wake her at night-she feels like she hasn't slept all night when she wakes in the morning. No energy to do activities in the home. Offered first availble appointment- Monday- patient declines due to her schedule- patient has been scheduled for next availble appointment with provider-5/2. Reason for Disposition . [1] MODERATE weakness (i.e., interferes with work, school, normal activities) AND [2] persists > 3 days  Answer Assessment - Initial Assessment Questions 1. DESCRIPTION: "Describe how you are feeling."     No energy- get up in morning and feels like she has not slept. Patient has chronic back pain. No energy- 5-10 minutes of activity- has to sit 2. SEVERITY: "How bad is it?"  "Can you stand and walk?"   - MILD - Feels weak or tired, but does not interfere with work, school or normal activities   - [place] to stand and walk; weakness interferes with work, school, or normal activities   - SEVERE - Unable to stand or walk     moderate 3. ONSET:  "When did the weakness begin?"     6 months- seems to be getting worse 4. CAUSE: "What do you think is causing the weakness?"     Sleep apnea, back pain- stress of pain 5. MEDICINES: "Have you recently started a new medicine or had a change in the amount of a medicine?"     No new medications 6. OTHER SYMPTOMS: "Do you have any other symptoms?" (e.g., chest pain, fever, cough, SOB, vomiting, diarrhea, bleeding, other areas of pain)     New knee pain 7. PREGNANCY: "Is there any chance you are pregnant?" "When was your last menstrual period?"     n/a  Protocols used: WEAKNESS (GENERALIZED) AND  FATIGUE-A-AH

## 2021-03-04 ENCOUNTER — Other Ambulatory Visit: Payer: Self-pay | Admitting: Family Medicine

## 2021-03-04 DIAGNOSIS — G8929 Other chronic pain: Secondary | ICD-10-CM

## 2021-03-04 DIAGNOSIS — E785 Hyperlipidemia, unspecified: Secondary | ICD-10-CM

## 2021-03-19 ENCOUNTER — Ambulatory Visit: Payer: Self-pay | Admitting: Family Medicine

## 2021-03-26 ENCOUNTER — Ambulatory Visit: Payer: Self-pay

## 2021-03-26 NOTE — Telephone Encounter (Signed)
Can alternate, heat/ice every 3-4 hours. Can take OTC tylenol. Go to Urgent Care if pain is severe or having any trouble ambulating.

## 2021-03-26 NOTE — Telephone Encounter (Signed)
Pt. Reports she fell down steps last Thursday. Is having mid-low back pain, mainly on right side. Having "spasms". Spoke with Arbie Cookey in the practice and will send triage for review.  Reason for Disposition . [1] High-risk adult (e.g., age > 19 years, osteoporosis, chronic steroid use) AND [2] still hurts  Answer Assessment - Initial Assessment Questions 1. MECHANISM: "How did the injury happen?" (Consider the possibility of domestic violence or elder abuse)     Fell down steps 2. ONSET: "When did the injury happen?" (Minutes or hours ago)     Thursday 3. LOCATION: "What part of the back is injured?"     Low back at kidney - right side 4. SEVERITY: "Can you move the back normally?"     Hurts to move 5. PAIN: "Is there any pain?" If Yes, ask: "How bad is the pain?"   (Scale 1-10; or mild, moderate, severe)     10 6. CORD SYMPTOMS: Any weakness or numbness of the arms or legs?"     No 7. SIZE: For cuts, bruises, or swelling, ask: "How large is it?" (e.g., inches or centimeters)     No 8. TETANUS: For any breaks in the skin, ask: "When was the last tetanus booster?"     Unsure 9. OTHER SYMPTOMS: "Do you have any other symptoms?" (e.g., abdominal pain, blood in urine)     No 10. PREGNANCY: "Is there any chance you are pregnant?" "When was your last menstrual period?"       No  Protocols used: BACK INJURY-A-AH

## 2021-03-26 NOTE — Telephone Encounter (Signed)
Pt advised.  She is going to go to an Urgent Care.   Thanks,   -Mickel Baas

## 2021-03-27 DIAGNOSIS — S20221A Contusion of right back wall of thorax, initial encounter: Secondary | ICD-10-CM | POA: Diagnosis not present

## 2021-03-27 DIAGNOSIS — Z87828 Personal history of other (healed) physical injury and trauma: Secondary | ICD-10-CM | POA: Diagnosis not present

## 2021-03-27 DIAGNOSIS — M545 Low back pain, unspecified: Secondary | ICD-10-CM | POA: Diagnosis not present

## 2021-03-27 DIAGNOSIS — M4180 Other forms of scoliosis, site unspecified: Secondary | ICD-10-CM | POA: Diagnosis not present

## 2021-03-27 DIAGNOSIS — W19XXXA Unspecified fall, initial encounter: Secondary | ICD-10-CM | POA: Diagnosis not present

## 2021-04-04 ENCOUNTER — Other Ambulatory Visit: Payer: Self-pay | Admitting: Family Medicine

## 2021-04-04 DIAGNOSIS — E785 Hyperlipidemia, unspecified: Secondary | ICD-10-CM

## 2021-04-09 ENCOUNTER — Other Ambulatory Visit: Payer: Self-pay | Admitting: Family Medicine

## 2021-04-09 MED ORDER — HYDROCODONE-ACETAMINOPHEN 7.5-325 MG PO TABS: 1.0000 | ORAL_TABLET | Freq: Four times a day (QID) | ORAL | 0 refills | Status: DC | PRN

## 2021-04-09 NOTE — Telephone Encounter (Signed)
Copied from [place] [id number]. Topic: Quick Communication - Rx Refill/Question >> Apr 09, 2021  9:47 AM Yvette Rack wrote: Medication: HYDROcodone-acetaminophen (NORCO) 7.5-325 MG tablet  Has the patient contacted their pharmacy? no  Preferred Pharmacy (with phone number or street name): [id number] [place], [place] - [address]   Phone: [phone]  Fax: [phone]  Agent: Please be advised that RX refills may take up to 3 business days. We ask that you follow-up with your pharmacy.

## 2021-04-09 NOTE — Telephone Encounter (Signed)
Requested medication (s) are due for refill today:yes  Requested medication (s) are on the active medication list: yes  Last refill: 02/28/21  #120  0 refills  Future visit scheduled yes  06/05/21  Notes to clinic: Not delegated  Requested Prescriptions  Pending Prescriptions Disp Refills   HYDROcodone-acetaminophen (NORCO) 7.5-325 MG tablet 120 tablet 0    Sig: Take 1 tablet by mouth 4 (four) times daily as needed for moderate pain.      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

## 2021-04-26 ENCOUNTER — Telehealth: Payer: Self-pay | Admitting: Family Medicine

## 2021-04-26 DIAGNOSIS — U071 COVID-19: Secondary | ICD-10-CM

## 2021-04-26 NOTE — Telephone Encounter (Signed)
Pt is calling to report - headache, poor concentration, body aches. Pt is calling to request a script for a blue pill unknown name. Preferred pharmacy [facility] [address] Cb- [phone]

## 2021-04-27 MED ORDER — AZITHROMYCIN 250 MG PO TABS: ORAL_TABLET | ORAL | 0 refills | Status: AC

## 2021-04-27 MED ORDER — MOLNUPIRAVIR EUA 200MG CAPSULE: 4.0000 | ORAL_CAPSULE | Freq: Two times a day (BID) | ORAL | 0 refills | Status: AC

## 2021-04-27 NOTE — Telephone Encounter (Signed)
Pt says in addition to the symptoms she reported, she says she what she is coughing up is "green." Pt is requesting a ZPAC if possible, please advise.  [id number] [place], [place] - [address]  [address] [place] [address]  Phone: [phone] Fax: [phone]

## 2021-04-27 NOTE — Telephone Encounter (Signed)
Patient advised and verbalized understanding 

## 2021-04-27 NOTE — Telephone Encounter (Signed)
Pt states she tested positive yesterday (04/26/2021) for Covid and her symptoms started yesterday as well.  Her symptoms include body aches, coughing, headaches.  Pt has had two Covid vaccines  02/01/2020 and 01/02/2020.  She has not had any of the boosters.    [facility] [address]   Thanks,   -Mickel Baas

## 2021-04-27 NOTE — Telephone Encounter (Signed)
[facility] If she has any trouble with breathing then she will need to go to ER.

## 2021-05-02 ENCOUNTER — Ambulatory Visit: Payer: Self-pay

## 2021-05-02 NOTE — Telephone Encounter (Signed)
The patient has concerns related to testing positive for COVID, via an at home test and would like to discuss further   The patient feels no symptoms but would like to be instructed on when they can test again   Please contact when possible  Reviewed quarantine requirements. Had COVID diagnosis last Thursday and states she is feeling better. Concerned because she was still testing positive. Instructed to call as needed.

## 2021-05-09 ENCOUNTER — Other Ambulatory Visit: Payer: Self-pay | Admitting: Family Medicine

## 2021-05-09 MED ORDER — HYDROCODONE-ACETAMINOPHEN 7.5-325 MG PO TABS: 1.0000 | ORAL_TABLET | Freq: Four times a day (QID) | ORAL | 0 refills | Status: DC | PRN

## 2021-05-09 NOTE — Telephone Encounter (Signed)
Requested medication (s) are due for refill today: Yes  Requested medication (s) are on the active medication list: Yes  Last refill:  04/09/21  Future visit scheduled: Yes  Notes to clinic:  See request.    Requested Prescriptions  Pending Prescriptions Disp Refills   HYDROcodone-acetaminophen (NORCO) 7.5-325 MG tablet 120 tablet 0    Sig: Take 1 tablet by mouth 4 (four) times daily as needed for moderate pain.      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

## 2021-05-09 NOTE — Telephone Encounter (Signed)
Medication Refill - Medication: HYDROcodone-acetaminophen (NORCO) 7.5-325 MG tablet   Has the patient contacted their pharmacy? Yes.    (Agent: If yes, when and what did the pharmacy advise?)  contact PCP   Preferred Pharmacy (with phone number or street name)  [id number] [place], [place] - [address] Phone:  [phone]  Fax:  [phone]      Agent: Please be advised that RX refills may take up to 3 business days. We ask that you follow-up with your pharmacy.

## 2021-05-14 ENCOUNTER — Other Ambulatory Visit: Payer: Self-pay | Admitting: Family Medicine

## 2021-05-14 DIAGNOSIS — K219 Gastro-esophageal reflux disease without esophagitis: Secondary | ICD-10-CM

## 2021-05-14 DIAGNOSIS — F419 Anxiety disorder, unspecified: Secondary | ICD-10-CM

## 2021-06-05 ENCOUNTER — Telehealth (INDEPENDENT_AMBULATORY_CARE_PROVIDER_SITE_OTHER): Payer: Medicare Other | Admitting: Family Medicine

## 2021-06-05 ENCOUNTER — Ambulatory Visit: Payer: Self-pay | Admitting: Family Medicine

## 2021-06-05 ENCOUNTER — Encounter: Payer: Self-pay | Admitting: Family Medicine

## 2021-06-05 DIAGNOSIS — M25552 Pain in left hip: Secondary | ICD-10-CM

## 2021-06-05 DIAGNOSIS — E7800 Pure hypercholesterolemia, unspecified: Secondary | ICD-10-CM

## 2021-06-05 DIAGNOSIS — G8929 Other chronic pain: Secondary | ICD-10-CM

## 2021-06-05 DIAGNOSIS — M545 Low back pain, unspecified: Secondary | ICD-10-CM

## 2021-06-05 NOTE — Progress Notes (Signed)
MyChart Video Visit    Virtual Visit via Video Note   This visit type was conducted due to national recommendations for restrictions regarding the COVID-19 Pandemic (e.g. social distancing) in an effort to limit this patient's exposure and mitigate transmission in our community. This patient is at least at moderate risk for complications without adequate follow up. This format is felt to be most appropriate for this patient at this time. Physical exam was limited by quality of the video and audio technology used for the visit.   Patient location: home Provider location: [facility]  I discussed the limitations of evaluation and management by telemedicine and the availability of in person appointments. The patient expressed understanding and agreed to proceed.  Patient: Alejandra Little   DOB: [birth year]   65 y.o. Female  MRN: [id number] Visit Date: 06/05/2021  Today's healthcare provider: Lelon Huh, MD   Chief Complaint  Patient presents with   Depression   Anxiety   Hyperlipidemia    Subjective    HPI  Anxiety and mood disorder with depressive features, Follow-up  She was last seen for anxiety 6 months ago. Changes made at last visit include none; continue Fluoxetine.   She reports good compliance with treatment. She reports good tolerance of treatment. She is not having side effects.   She feels her anxiety is moderate and Unchanged since last visit.  Symptoms: No chest pain No difficulty concentrating  No dizziness No fatigue  No feelings of losing control No insomnia  No irritable No palpitations  No panic attacks No racing thoughts  No shortness of breath Yes sweating  No tremors/shakes    GAD-7 Results GAD-7 Generalized Anxiety Disorder Screening Tool 12/05/2020  1. Feeling Nervous, Anxious, or on Edge 1  2. Not Being Able to Stop or Control Worrying 0  3. Worrying Too Much About Different Things 1  4. Trouble Relaxing 0  5. Being So Restless it's Hard To Sit  Still 1  6. Becoming Easily Annoyed or Irritable 1  7. Feeling Afraid As If Something Awful Might Happen 0  Total GAD-7 Score 4  Difficulty At Work, Home, or Getting  Along With Others? Not difficult at all    PHQ-9 Scores PHQ9 SCORE ONLY 06/05/2021 12/05/2020 05/08/2020  PHQ-9 Total Score 2 7 6     ---------------------------------------------------------------------------------------------------   Lipid/Cholesterol, Follow-up  Last lipid panel Other pertinent labs  Lab Results  Component Value Date   CHOL 208 (H) 05/29/2020   HDL 45 05/29/2020   LDLCALC 132 (H) 05/29/2020   TRIG 171 (H) 05/29/2020   CHOLHDL 4.6 (H) 05/29/2020   Lab Results  Component Value Date   ALT 12 05/29/2020   AST 12 05/29/2020   PLT 155 05/29/2020   TSH 1.470 05/29/2020     She was last seen for this 6 months ago.  Management since that visit includes continue same medication.  She reports good compliance with treatment. She is not having side effects.   Symptoms: No chest pain No chest pressure/discomfort  No dyspnea No lower extremity edema  No numbness or tingling of extremity No orthopnea  No palpitations No paroxysmal nocturnal dyspnea  No speech difficulty No syncope   Current diet: well balanced Current exercise: none  The 10-year ASCVD risk score Mikey Bussing DC Jr., et al., 2013) is: 5.6%  ---------------------------------------------------------------------------------------------------   Follow up for insomnia:  The patient was last seen for this 6 months ago. Changes made at last visit include none. Patient was advised  that she can take a second alprazolam occasionally.  She reports good compliance with treatment. She feels that condition is Unchanged. She is not having side effects.   -----------------------------------------------------------------------------------------   Also complains of persistent pain in low back going into the left hip for several months and getting  worse. Hurts at night and keeps her up.     Medications: Outpatient Medications Prior to Visit  Medication Sig   ALPRAZolam (XANAX) 0.5 MG tablet Take 1-2 tablets (0.5-1 mg total) by mouth at bedtime.   FLUoxetine (PROZAC) 20 MG capsule TAKE 3 CAPSULES BY MOUTH EVERY DAY   gabapentin (NEURONTIN) 300 MG capsule TAKE 1 CAPSULE (300MG ) BY MOUTH THREE TIMES DAILY AND TAKE 2 CAPSULES (600MG ) BY MOUTH AT BEDTIME   HYDROcodone-acetaminophen (NORCO) 7.5-325 MG tablet Take 1 tablet by mouth 4 (four) times daily as needed for moderate pain.   NEXIUM 40 MG capsule TAKE 1 CAPSULE BY MOUTH EVERY DAY   pravastatin (PRAVACHOL) 40 MG tablet TAKE 1 TABLET BY MOUTH EVERYDAY AT BEDTIME   Specialty Vitamins Products ([facility] HAIR/SKIN/NAILS) TABS Take 1 tablet by mouth daily.    No facility-administered medications prior to visit.    Review of Systems  Constitutional:  Negative for appetite change, chills, fatigue and fever.  Respiratory:  Negative for chest tightness and shortness of breath.   Cardiovascular:  Negative for chest pain and palpitations.  Gastrointestinal:  Negative for abdominal pain, nausea and vomiting.  Neurological:  Negative for dizziness and weakness.     Objective    There were no vitals taken for this visit.   Physical Exam  Awake, alert, oriented x 3. In no apparent distress    Assessment & Plan     1. Pure hypercholesterolemia She is tolerating pravastatin well with no adverse effects.   - CBC - Comprehensive metabolic panel - Lipid panel  2. Left low back pain, unspecified chronicity, unspecified whether sciatica present  - DG Lumbar Spine Complete; Future  3. Hip pain, chronic, left  - DG Hip Unilat W OR W/O Pelvis 2-3 Views Left; Future   4. Mood disorder She feels she is doing very well with fluoxetine and wishes to continue current medication unchanged.     I discussed the assessment and treatment plan with the patient. The patient was provided an  opportunity to ask questions and all were answered. The patient agreed with the plan and demonstrated an understanding of the instructions.   The patient was advised to call back or seek an in-person evaluation if the symptoms worsen or if the condition fails to improve as anticipated.  Video connection was lost when less than 50% of the duration of the visit was complete, at which time the remainder of the visit was completed via audio only.  I provided 12 minutes of non-face-to-face time during this encounter.  The entirety of the information documented in the History of Present Illness, Review of Systems and Physical Exam were personally obtained by me. Portions of this information were initially documented by the CMA and reviewed by me for thoroughness and accuracy.    Lelon Huh, MD [facility] [phone] (phone) [phone] (fax)  [place]

## 2021-06-05 NOTE — Patient Instructions (Addendum)
Please review the attached list of medications and notify my office if there are any errors.   Go to the [facility] on [facility] for left hip and back Xray   Please go to the lab draw station in [address] on the second floor of [facility]  when you are fasting for 8 hours. Normal hours are 8:00am to 11:30am and 1:00pm to 4:00pm Monday through Friday

## 2021-06-08 ENCOUNTER — Other Ambulatory Visit: Payer: Self-pay | Admitting: Family Medicine

## 2021-06-08 MED ORDER — HYDROCODONE-ACETAMINOPHEN 7.5-325 MG PO TABS: 1.0000 | ORAL_TABLET | Freq: Four times a day (QID) | ORAL | 0 refills | Status: DC | PRN

## 2021-06-08 NOTE — Telephone Encounter (Signed)
Requested medication (s) are due for refill today: yes  Requested medication (s) are on the active medication list: yes   Last refill:  05/09/2021  Future visit scheduled: no   Notes to clinic:  this refill cannot be delegated    Requested Prescriptions  Pending Prescriptions Disp Refills   HYDROcodone-acetaminophen (NORCO) 7.5-325 MG tablet 120 tablet 0    Sig: Take 1 tablet by mouth 4 (four) times daily as needed for moderate pain.      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

## 2021-06-08 NOTE — Telephone Encounter (Signed)
Medication Refill - Medication: HYDROcodone-acetaminophen (NORCO) 7.5-325 MG tablet   Has the patient contacted their pharmacy? No. (Agent: If no, request that the patient contact the pharmacy for the refill.) (Agent: If yes, when and what did the pharmacy advise?)  Preferred Pharmacy (with phone number [facility] [address]   Agent: Please be advised that RX refills may take up to 3 business days. We ask that you follow-up with your pharmacy.

## 2021-06-11 ENCOUNTER — Ambulatory Visit
Admission: RE | Admit: 2021-06-11 | Discharge: 2021-06-11 | Disposition: A | Discharge status: Home / Self Care | Payer: Medicare Other | Attending: Family Medicine | Admitting: Family Medicine

## 2021-06-11 ENCOUNTER — Ambulatory Visit
Admission: RE | Admit: 2021-06-11 | Discharge: 2021-06-11 | Disposition: A | Discharge status: Home / Self Care | Payer: Medicare Other | Source: Ambulatory Visit | Attending: Family Medicine | Admitting: Family Medicine

## 2021-06-11 ENCOUNTER — Other Ambulatory Visit: Payer: Self-pay

## 2021-06-11 ENCOUNTER — Telehealth (INDEPENDENT_AMBULATORY_CARE_PROVIDER_SITE_OTHER): Payer: Medicare Other

## 2021-06-11 DIAGNOSIS — M25552 Pain in left hip: Secondary | ICD-10-CM | POA: Diagnosis not present

## 2021-06-11 DIAGNOSIS — R300 Dysuria: Secondary | ICD-10-CM

## 2021-06-11 DIAGNOSIS — G8929 Other chronic pain: Secondary | ICD-10-CM | POA: Diagnosis not present

## 2021-06-11 DIAGNOSIS — M545 Low back pain, unspecified: Secondary | ICD-10-CM | POA: Insufficient documentation

## 2021-06-11 DIAGNOSIS — E7800 Pure hypercholesterolemia, unspecified: Secondary | ICD-10-CM | POA: Diagnosis not present

## 2021-06-11 LAB — POCT URINALYSIS DIPSTICK
Blood, UA: NEGATIVE
Glucose, UA: NEGATIVE
Ketones, UA: NEGATIVE
Nitrite, UA: NEGATIVE
Protein, UA: NEGATIVE
Spec Grav, UA: 1.025 (ref 1.010–1.025)
Urobilinogen, UA: 0.2 E.U./dL
pH, UA: 6 (ref 5.0–8.0)

## 2021-06-11 NOTE — Telephone Encounter (Signed)
Patient dropped off a urine sample. She complains of back pain and mucus in her urine. Please review POCT UA results and advise if you would like to send a urine culture to the lab.

## 2021-06-11 NOTE — Telephone Encounter (Signed)
Done

## 2021-06-11 NOTE — Telephone Encounter (Signed)
Please send for culture

## 2021-06-12 LAB — COMPREHENSIVE METABOLIC PANEL
ALT: 8 IU/L (ref 0–32)
AST: 9 IU/L (ref 0–40)
Albumin/Globulin Ratio: 2.1 (ref 1.2–2.2)
Albumin: 4.5 g/dL (ref 3.8–4.8)
Alkaline Phosphatase: 60 IU/L (ref 44–121)
BUN/Creatinine Ratio: 23 (ref 12–28)
BUN: 17 mg/dL (ref 8–27)
Bilirubin Total: 0.3 mg/dL (ref 0.0–1.2)
CO2: 28 mmol/L (ref 20–29)
Calcium: 9.9 mg/dL (ref 8.7–10.3)
Chloride: 103 mmol/L (ref 96–106)
Creatinine, Ser: 0.75 mg/dL (ref 0.57–1.00)
Globulin, Total: 2.1 g/dL (ref 1.5–4.5)
Glucose: 103 mg/dL — ABNORMAL HIGH (ref 65–99)
Potassium: 4.7 mmol/L (ref 3.5–5.2)
Sodium: 143 mmol/L (ref 134–144)
Total Protein: 6.6 g/dL (ref 6.0–8.5)
eGFR: 88 mL/min/{1.73_m2} (ref >59)

## 2021-06-12 LAB — CBC
Hematocrit: 42.7 % (ref 34.0–46.6)
Hemoglobin: 13.9 g/dL (ref 11.1–15.9)
MCH: 29.3 pg (ref 26.6–33.0)
MCHC: 32.6 g/dL (ref 31.5–35.7)
MCV: 90 fL (ref 79–97)
Platelets: 149 10*3/uL — ABNORMAL LOW (ref 150–450)
RBC: 4.75 x10E6/uL (ref 3.77–5.28)
RDW: 12.7 % (ref 11.7–15.4)
WBC: 5.6 10*3/uL (ref 3.4–10.8)

## 2021-06-12 LAB — LIPID PANEL
Chol/HDL Ratio: 4.5 ratio — ABNORMAL HIGH (ref 0.0–4.4)
Cholesterol, Total: 194 mg/dL (ref 100–199)
HDL: 43 mg/dL (ref >39)
LDL Chol Calc (NIH): 122 mg/dL — ABNORMAL HIGH (ref 0–99)
Triglycerides: 165 mg/dL — ABNORMAL HIGH (ref 0–149)
VLDL Cholesterol Cal: 29 mg/dL (ref 5–40)

## 2021-06-14 LAB — URINE CULTURE

## 2021-06-20 ENCOUNTER — Other Ambulatory Visit: Payer: Self-pay | Admitting: *Deleted

## 2021-06-20 DIAGNOSIS — G8929 Other chronic pain: Secondary | ICD-10-CM

## 2021-06-23 ENCOUNTER — Other Ambulatory Visit: Payer: Self-pay | Admitting: Family Medicine

## 2021-06-23 DIAGNOSIS — F419 Anxiety disorder, unspecified: Secondary | ICD-10-CM

## 2021-06-23 NOTE — Telephone Encounter (Signed)
Requested medication (s) are due for refill today: yes  Requested medication (s) are on the active medication list: yes  Last refill:  12/05/20 #45 5 RF  Future visit scheduled: no  Notes to clinic:  med not delegated to NT to RF   Requested Prescriptions  Pending Prescriptions Disp Refills   ALPRAZolam (XANAX) 0.5 MG tablet [Pharmacy Med Name: ALPRAZOLAM 0.5 MG TABLET] 45 tablet     Sig: Take 1-2 tablets (0.5-1 mg total) by mouth at bedtime.      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 [name], MD   11 months ago [place], [name]   1 year ago Other fatigue   [facility] [name], MD   1 year ago Upper respiratory tract infection, unspecified type   [facility] [name], MD

## 2021-07-03 ENCOUNTER — Telehealth: Payer: Self-pay

## 2021-07-03 DIAGNOSIS — R42 Dizziness and giddiness: Secondary | ICD-10-CM

## 2021-07-03 MED ORDER — MECLIZINE HCL 25 MG PO TABS: 25.0000 mg | ORAL_TABLET | Freq: Three times a day (TID) | ORAL | 0 refills | Status: DC | PRN

## 2021-07-03 NOTE — Telephone Encounter (Signed)
Copied from [place] [id number]. Topic: General - Other >> Jul 03, 2021  9:36 AM Tessa Lerner A wrote: Reason for CRM: Patient has called requesting a prescription for something to help with their disorientation and dizziness   The patient shares that they've been experiencing the sensations since 4:45 AM today 07/03/21  The patient declined to schedule an appt to see their PCP, and shares that they've previously made the practice aware of this problem  Please contact further if needed

## 2021-07-03 NOTE — Addendum Note (Signed)
Addended by: Lelon Huh E on: 07/03/2021 12:11 PM   Modules accepted: Orders

## 2021-07-03 NOTE — Telephone Encounter (Signed)
Can send prescription for meclizine to pharmacy of her choice.

## 2021-07-03 NOTE — Telephone Encounter (Signed)
[facility].  Pt advised.  Thanks,   -Mickel Baas

## 2021-07-03 NOTE — Addendum Note (Signed)
Addended by: Ashley Royalty E on: 07/03/2021 02:46 PM   Modules accepted: Orders

## 2021-07-10 ENCOUNTER — Other Ambulatory Visit: Payer: Self-pay | Admitting: Family Medicine

## 2021-07-10 MED ORDER — HYDROCODONE-ACETAMINOPHEN 7.5-325 MG PO TABS: 1.0000 | ORAL_TABLET | Freq: Four times a day (QID) | ORAL | 0 refills | Status: DC | PRN

## 2021-07-10 NOTE — Telephone Encounter (Signed)
Requested medication (s) are due for refill today:  yes   Requested medication (s) are on the active medication list:  yes  Last refill:  06/08/2021  Future visit scheduled: no  Notes to clinic:  this refill cannot be delegated    Requested Prescriptions  Pending Prescriptions Disp Refills   HYDROcodone-acetaminophen (NORCO) 7.5-325 MG tablet 120 tablet 0    Sig: Take 1 tablet by mouth 4 (four) times daily as needed for moderate pain.     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

## 2021-07-10 NOTE — Telephone Encounter (Signed)
Medication Refill - Medication: hydrocodone 7.5-325 mg  Has the patient contacted their pharmacy? Yes.  Told to call provider. Pt last appt 06-05-2021 no future appt sch Preferred Pharmacy (with phone number [facility] street name): [facility] [address] [place] phone number [phone]  Agent: Please be advised that RX refills may take up to 3 business days. We ask that you follow-up with your pharmacy.

## 2021-07-18 ENCOUNTER — Telehealth: Payer: Self-pay

## 2021-07-18 NOTE — Telephone Encounter (Signed)
Copied from [place]. Topic: General - Other >> Jul 18, 2021 10:02 AM Rayann Heman wrote: Reason for CRM: Pt called and stated that she would like to Know if Dr Caryn Section gives Cortizone shots or will need to be referred out

## 2021-07-18 NOTE — Telephone Encounter (Signed)
I Called and spoke with patient. She complains of chronic pain in her left hip. She wants to know if Dr. Caryn Section can give her a cortisone injection to help ease off the pain. Patient has been seen for this hip pain. Her last office visit was 06/05/2021 (virtual visit). X rays were ordered and patient was referred to orthopedics. Patient states she turned the orthopedic appointment down at that time because she was in so much debt with her other medical bills. Patient would like to have something done asap for her hip pain. Patient wants to see if Dr. Caryn Section can give her a cortisone injection instead of having to go to an orthopedic doctor. Please advise. I advised patient that Dr. Caryn Section is out of the office today. Patient is fine with waiting until Dr. Caryn Section returns for a response.

## 2021-07-19 NOTE — Telephone Encounter (Signed)
She needs referral to orthopedics I don't do hip injections.

## 2021-07-20 ENCOUNTER — Other Ambulatory Visit: Payer: Self-pay

## 2021-07-20 ENCOUNTER — Other Ambulatory Visit: Payer: Self-pay | Admitting: Family Medicine

## 2021-07-20 DIAGNOSIS — M1612 Unilateral primary osteoarthritis, left hip: Secondary | ICD-10-CM | POA: Diagnosis not present

## 2021-07-20 DIAGNOSIS — M169 Osteoarthritis of hip, unspecified: Secondary | ICD-10-CM | POA: Diagnosis not present

## 2021-07-20 DIAGNOSIS — G8929 Other chronic pain: Secondary | ICD-10-CM

## 2021-07-20 DIAGNOSIS — E785 Hyperlipidemia, unspecified: Secondary | ICD-10-CM

## 2021-07-20 MED ORDER — PRAVASTATIN SODIUM 40 MG PO TABS: ORAL_TABLET | ORAL | 0 refills | Status: DC

## 2021-07-20 NOTE — Telephone Encounter (Signed)
Pt stated she needs a refill for her Cholesterol medication and does not remember the name just knows its a green pill/ please advise and [facility] [address]

## 2021-07-20 NOTE — Telephone Encounter (Signed)
I called patient back. She wants to be seen by orthopedics today. I gave her the contact info to [facility]. Patient plan to go by there today.

## 2021-07-20 NOTE — Telephone Encounter (Signed)
Pt called back and I advised her of message below/ she stated she would like the referral to Ortho asap for her pain and asked if someone can call her asap when that is placed with the information

## 2021-07-20 NOTE — Telephone Encounter (Signed)
Valid encounter. No future visits at this time

## 2021-07-26 DIAGNOSIS — Z1152 Encounter for screening for COVID-19: Secondary | ICD-10-CM | POA: Diagnosis not present

## 2021-08-08 DIAGNOSIS — M25552 Pain in left hip: Secondary | ICD-10-CM | POA: Diagnosis not present

## 2021-08-13 ENCOUNTER — Other Ambulatory Visit: Payer: Self-pay | Admitting: Family Medicine

## 2021-08-13 NOTE — Telephone Encounter (Signed)
Requested medication (s) are due for refill today: yes   Requested medication (s) are on the active medication list: yes  Last refill:  07/09/21 #120 0 refills   Future visit scheduled: no  Notes to clinic:  not delegated per protocol     Requested Prescriptions  Pending Prescriptions Disp Refills   HYDROcodone-acetaminophen (NORCO) 7.5-325 MG tablet 120 tablet 0    Sig: Take 1 tablet by mouth 4 (four) times daily as needed for moderate pain.     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

## 2021-08-13 NOTE — Telephone Encounter (Signed)
Copied from [place] [phone]. Topic: Quick Communication - Rx Refill/Question >> Aug 13, 2021  9:17 AM Yvette Rack wrote: Medication: HYDROcodone-acetaminophen ([place]) 7.5-325 MG tablet  Has the patient contacted their pharmacy? No. Pt told to contact pcp (Agent: If no, request that the patient contact the pharmacy for the refill.) (Agent: If yes, when and what did the pharmacy advise?)  Preferred Pharmacy (with phone number [facility] street name): [id number] [place], [place] - [address]  Phone:  [phone] Fax:  [phone]  Has the patient been seen for an appointment in the last year OR does the patient have an upcoming appointment? Yes.    Agent: Please be advised that RX refills may take up to 3 business days. We ask that you follow-up with your pharmacy.

## 2021-08-14 MED ORDER — HYDROCODONE-ACETAMINOPHEN 7.5-325 MG PO TABS: 1.0000 | ORAL_TABLET | Freq: Four times a day (QID) | ORAL | 0 refills | Status: DC | PRN

## 2021-08-14 NOTE — Telephone Encounter (Signed)
LOV 06/05/2021 NOV: None   Last Refill 07/09/2021 #120 0 Refills.

## 2021-08-29 ENCOUNTER — Telehealth: Payer: Self-pay

## 2021-08-29 NOTE — Telephone Encounter (Signed)
Copied from [place] [id number]. Topic: General - Other >> Aug 29, 2021 12:25 PM Tessa Lerner A wrote: Reason for CRM: The patient has called to request an MRI of their left hip  The patient shares that they're experiencing discomfort that extends in to their groin as well as their back (the patient shares that the discomfort has occurred for roughly more than a month)   The patient has stressed the urgency of their request and shares that they're unable to put pressure on their left leg  Please contact further when possible regarding the orders

## 2021-08-30 NOTE — Telephone Encounter (Signed)
She needs a referral to orthopedist for chronic hip and back pain.

## 2021-08-30 NOTE — Telephone Encounter (Signed)
Pt advised.  She states she has an appointment with [facility] in November.     Thanks,   -Mickel Baas

## 2021-09-06 DIAGNOSIS — Z23 Encounter for immunization: Secondary | ICD-10-CM | POA: Diagnosis not present

## 2021-09-12 ENCOUNTER — Other Ambulatory Visit: Payer: Self-pay | Admitting: Family Medicine

## 2021-09-12 NOTE — Telephone Encounter (Signed)
Requested medications are due for refill today.  yes  Requested medications are on the active medications list.  yes  Last refill. 08/14/2021  Future visit scheduled.   no  Notes to clinic.  Medication not delegated.

## 2021-09-12 NOTE — Telephone Encounter (Signed)
Copied from [place] [phone]. Topic: Quick Communication - Rx Refill/Question >> Sep 12, 2021  9:24 AM Tessa Lerner A wrote: Medication: HYDROcodone-acetaminophen ([place]) 7.5-325 MG tablet [[id number]]   Has the patient contacted their pharmacy? No. (Agent: If no, request that the patient contact the pharmacy for the refill. If patient does not wish to contact the pharmacy document the reason why and proceed with request.) (Agent: If yes, when and what did the pharmacy advise?)  Preferred Pharmacy (with phone number or street name): [id number] [place], [place] - [address]  Phone:  [phone] Fax:  [phone]  Has the patient been seen for an appointment in the last year OR does the patient have an upcoming appointment? Yes.    Agent: Please be advised that RX refills may take up to 3 business days. We ask that you follow-up with your pharmacy.

## 2021-09-13 MED ORDER — HYDROCODONE-ACETAMINOPHEN 7.5-325 MG PO TABS: 1.0000 | ORAL_TABLET | Freq: Four times a day (QID) | ORAL | 0 refills | Status: DC | PRN

## 2021-09-24 DIAGNOSIS — M25552 Pain in left hip: Secondary | ICD-10-CM | POA: Diagnosis not present

## 2021-10-01 ENCOUNTER — Ambulatory Visit: Payer: Self-pay

## 2021-10-01 NOTE — Telephone Encounter (Signed)
Returned patient's  call.  Patient is requesting a call back from Dr. Caryn Section.   Patient has had pain in her groin since August which is very intense.   Patient does not think that the pain is from her hip, which is what the MRI is for, she thinks it is from kidney stones. Patient is wondering if MRI scheduled for tomorrow will show kidney stones or not.   Patient is a bit upset as she feels her pain has not been properly addressed.      Pt stated she has an appt for a MRI tomorrow but she requests call back from a nurse to discuss possible kidney stones. Cb# [phone]

## 2021-10-01 NOTE — Telephone Encounter (Signed)
I called patient and advised her that Dr. Caryn Section is out of the office this afternoon. Patient says she has an appointment to have an MRI done tomorrow. [facility] set up this test. Patient wants to know if the MRI will show kidney stones? Patient thinks she may have kidney stones and this may be the cause of her pain. She says the pain is in her groin area and it hurts to walk. Patient is unsure what type of MRI is ordered. She plans to call [facility] to find out if the MRI will show her kidneys and whether it will show kidney stones.

## 2021-10-02 DIAGNOSIS — M1612 Unilateral primary osteoarthritis, left hip: Secondary | ICD-10-CM | POA: Diagnosis not present

## 2021-10-02 DIAGNOSIS — M25552 Pain in left hip: Secondary | ICD-10-CM | POA: Diagnosis not present

## 2021-10-02 DIAGNOSIS — M24852 Other specific joint derangements of left hip, not elsewhere classified: Secondary | ICD-10-CM | POA: Diagnosis not present

## 2021-10-02 DIAGNOSIS — M94252 Chondromalacia, left hip: Secondary | ICD-10-CM | POA: Diagnosis not present

## 2021-10-05 DIAGNOSIS — M1612 Unilateral primary osteoarthritis, left hip: Secondary | ICD-10-CM | POA: Diagnosis not present

## 2021-10-08 ENCOUNTER — Other Ambulatory Visit: Payer: Self-pay

## 2021-10-08 ENCOUNTER — Telehealth: Payer: Self-pay | Admitting: Family Medicine

## 2021-10-08 DIAGNOSIS — K219 Gastro-esophageal reflux disease without esophagitis: Secondary | ICD-10-CM

## 2021-10-08 NOTE — Telephone Encounter (Signed)
[facility] faxed refill request for the following medications:   esomeprazole (NEXIUM) 40 MG capsule    Please advise.  

## 2021-10-09 MED ORDER — ESOMEPRAZOLE MAGNESIUM 40 MG PO CPDR: DELAYED_RELEASE_CAPSULE | ORAL | 4 refills | Status: DC

## 2021-10-15 ENCOUNTER — Ambulatory Visit: Payer: Self-pay

## 2021-10-15 NOTE — Telephone Encounter (Signed)
HYDROcodone-acetaminophen (NORCO) 7.5-325 MG tablet 120 tablet 0 09/13/2021  Sig - Route: Take 1 tablet by mouth 4 (four) times daily as needed for moderate pain. - Oral  Sent to pharmacy as: HYDROcodone-acetaminophen ([place]) 7.5-325 MG tablet  Earliest Fill Date: 09/13/2021  E-Prescribing Status: Receipt confirmed by pharmacy (09/13/2021  7:33 AM EDT)   Pt is wanting a refill on this med, she has not been seen in 07/26/20 but one Mychart with Dr Caryn Section on 7/19. Mentioned may need visit but she states having hip surgery this week and would need an appt tomorrow or Wed. Please advise. She states she absolutely must have this med FU at [phone]   Pt. Reports she is having hip surgery Thursday and needs a refill on her hydrocodone. Please advise pt.    Answer Assessment - Initial Assessment Questions 1. DRUG NAME: "What medicine do you need to have refilled?"     Norco 2. REFILLS REMAINING: "How many refills are remaining?" (Note: The label on the medicine or pill bottle will show how many refills are remaining. If there are no refills remaining, then a renewal may be needed.)     0 3. EXPIRATION DATE: "What is the expiration date?" (Note: The label states when the prescription will expire, and thus can no longer be refilled.)     N/a 4. PRESCRIBING HCP: "Who prescribed it?" Reason: If prescribed by specialist, call should be referred to that group.     Dr. Caryn Section 5. SYMPTOMS: "Do you have any symptoms?"     No 6. PREGNANCY: "Is there any chance that you are pregnant?" "When was your last menstrual period?"     No  Protocols used: Medication Refill and Renewal Call-A-AH

## 2021-10-16 MED ORDER — HYDROCODONE-ACETAMINOPHEN 7.5-325 MG PO TABS: 1.0000 | ORAL_TABLET | Freq: Four times a day (QID) | ORAL | 0 refills | Status: DC | PRN

## 2021-10-18 HISTORY — PX: TOTAL HIP ARTHROPLASTY: SHX124

## 2021-10-23 DIAGNOSIS — M1612 Unilateral primary osteoarthritis, left hip: Secondary | ICD-10-CM | POA: Diagnosis not present

## 2021-10-24 ENCOUNTER — Telehealth: Payer: Self-pay

## 2021-10-24 NOTE — Telephone Encounter (Signed)
Follow up appointment scheduled for 10/29/2021 at 9:40am.

## 2021-10-24 NOTE — Telephone Encounter (Signed)
Copied from [place] [phone]. Topic: Appointment Scheduling - Scheduling Inquiry for Clinic >> Oct 24, 2021  8:38 AM Tessa Lerner A wrote: Reason for CRM: The patient would like to be seen by Dr. Caryn Section for a medication follow up prior to 11/08/21  The patient would like to be seen virtually if possible   The agent was unable to successfully schedule at the time of call with patient  Please contact further

## 2021-10-24 NOTE — Telephone Encounter (Signed)
She's not been seen in the office for over a year, needs to schedule an in-office appt.

## 2021-10-24 NOTE — Telephone Encounter (Signed)
Please advise of ok to schedule medication follow up appointment as a virtual visit. Her last follow up appointment on 06/05/2021 was a virtual visit.

## 2021-10-29 ENCOUNTER — Ambulatory Visit (INDEPENDENT_AMBULATORY_CARE_PROVIDER_SITE_OTHER): Payer: Medicare Other | Admitting: Family Medicine

## 2021-10-29 ENCOUNTER — Other Ambulatory Visit: Payer: Self-pay

## 2021-10-29 ENCOUNTER — Encounter: Payer: Self-pay | Admitting: Family Medicine

## 2021-10-29 VITALS — BP 113/65 | HR 67 | Temp 98.1°F | Resp 16 | Wt 213.0 lb

## 2021-10-29 DIAGNOSIS — F0631 Mood disorder due to known physiological condition with depressive features: Secondary | ICD-10-CM

## 2021-10-29 DIAGNOSIS — F1190 Opioid use, unspecified, uncomplicated: Secondary | ICD-10-CM | POA: Diagnosis not present

## 2021-10-29 DIAGNOSIS — M1612 Unilateral primary osteoarthritis, left hip: Secondary | ICD-10-CM

## 2021-10-29 DIAGNOSIS — E7800 Pure hypercholesterolemia, unspecified: Secondary | ICD-10-CM

## 2021-10-29 DIAGNOSIS — M5137 Other intervertebral disc degeneration, lumbosacral region: Secondary | ICD-10-CM | POA: Diagnosis not present

## 2021-10-29 NOTE — Patient Instructions (Signed)
Please call the [facility] at [facility] at [phone] to schedule your mammogram.  

## 2021-10-29 NOTE — Progress Notes (Signed)
Established patient visit   Patient: Alejandra Little   DOB: 05-14-1956   65 y.o. Female  MRN: [id number] Visit Date: 10/29/2021  Today's healthcare provider: Lelon Huh, MD   Chief Complaint  Patient presents with   Hyperlipidemia   Depression   Back Pain   Subjective    HPI  Lipid/Cholesterol, Follow-up  Last lipid panel Other pertinent labs  Lab Results  Component Value Date   CHOL 194 06/11/2021   HDL 43 06/11/2021   LDLCALC 122 (H) 06/11/2021   TRIG 165 (H) 06/11/2021   CHOLHDL 4.5 (H) 06/11/2021   Lab Results  Component Value Date   ALT 8 06/11/2021   AST 9 06/11/2021   PLT 149 (L) 06/11/2021   TSH 1.470 05/29/2020     She was last seen for this 4 months ago.  Management since that visit includes no changes.  She reports excellent compliance with treatment. She is not having side effects.   Symptoms: No chest pain No chest pressure/discomfort  No dyspnea No lower extremity edema  No numbness or tingling of extremity No orthopnea  No palpitations No paroxysmal nocturnal dyspnea  No speech difficulty No syncope   Current diet: in general, a "healthy" diet   Current exercise: none  The 10-year ASCVD risk score (Arnett DK, et al., 2019) is: 5.8%  ---------------------------------------------------------------------------------------------------  Follow up for mood disorder  The patient was last seen for this 4 months ago. Changes made at last visit include no changes.  She reports good compliance with treatment. She feels that condition is Worse. She had to put her dog to sleep on thanksgiving, which has her more depressed than usual. Is also having a lot of hip  and back pain which aggravates the depression. She does feel current dose of fluoxetine is helping and does not wish to change medications.  She is not having side effects.   -----------------------------------------------------------------------------------------  Follow up for  chronic back pain  The patient was last seen for this 4 months ago. She has since been referred to orthopedics for OA and is scheduled for hip replacement later this month.   She reports good compliance with treatment. She feels that condition is Unchanged. She is not having side effects.   -----------------------------------------------------------------------------------------      Medications: Outpatient Medications Prior to Visit  Medication Sig   ALPRAZolam (XANAX) 0.5 MG tablet TAKE 1-2 TABLETS (0.5-1 MG TOTAL) BY MOUTH AT BEDTIME.   esomeprazole (NEXIUM) 40 MG capsule TAKE 1 CAPSULE BY MOUTH EVERY DAY   FLUoxetine (PROZAC) 20 MG capsule TAKE 3 CAPSULES BY MOUTH EVERY DAY   gabapentin (NEURONTIN) 300 MG capsule TAKE 1 CAPSULE (300MG ) BY MOUTH THREE TIMES DAILY AND TAKE 2 CAPSULES (600MG ) BY MOUTH AT BEDTIME   HYDROcodone-acetaminophen (NORCO) 7.5-325 MG tablet Take 1 tablet by mouth 4 (four) times daily as needed for moderate pain.   meclizine (ANTIVERT) 25 MG tablet Take 1 tablet (25 mg total) by mouth 3 (three) times daily as needed for dizziness.   pravastatin (PRAVACHOL) 40 MG tablet TAKE 1 TABLET BY MOUTH EVERYDAY AT BEDTIME   Specialty Vitamins Products ([facility] HAIR/SKIN/NAILS) TABS Take 1 tablet by mouth daily.    No facility-administered medications prior to visit.    Review of Systems  Constitutional:  Negative for appetite change, chills, fatigue and fever.  Respiratory:  Negative for chest tightness and shortness of breath.   Cardiovascular:  Negative for chest pain and palpitations.  Gastrointestinal:  Negative for  abdominal pain, nausea and vomiting.  Musculoskeletal:  Positive for arthralgias and back pain.  Neurological:  Negative for dizziness and weakness.  Psychiatric/Behavioral:  Positive for dysphoric mood.       Objective    BP 113/65 (BP Location: Left Arm, Patient Position: Sitting, Cuff Size: Large)   Pulse 67   Temp 98.1 F (36.7 C) (Oral)    Resp 16   Wt 213 lb (96.6 kg)   SpO2 97% Comment: room air  BMI 35.45 kg/m    Physical Exam   General: Appearance:    Obese female in no acute distress  Eyes:    PERRL, conjunctiva/corneas clear, EOM's intact       Lungs:     Clear to auscultation bilaterally, respirations unlabored  Heart:    Normal heart rate. Normal rhythm. No murmurs, rubs, or gallops.    MS:   All extremities are intact.    Neurologic:   Awake, alert, oriented x 3. No apparent focal neurological defect.          Assessment & Plan     1. 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 She wishes to continue current fluoxetine.   2. Primary osteoarthritis of left hip Anticipating hip replacement surgery by Dr. Harlow Mares. No absolute or relative contraindications for surgery.   3. Chronic, continuous use of opioids  - Pain [facility] ([facility]), [place]  4. DDD (degenerative disc disease), lumbosacral   5. Pure hypercholesterolemia She is tolerating pravastatin well with no adverse effects.     6. Morbid obesity Expect increased exercise after hip replacement.   Future Appointments  Date Time Provider [place]  05/01/2022 10:00 AM Caryn Section, Kirstie Peri, MD [facility]        The entirety of the information documented in the History of Present Illness, Review of Systems and Physical Exam were personally obtained by me. Portions of this information were initially documented by the CMA and reviewed by me for thoroughness and accuracy.     Lelon Huh, MD  [facility] [phone] (phone) [phone] (fax)  [place]

## 2021-10-30 LAB — PAIN MGT SCRN (14 DRUGS), UR
Amphetamine Scrn, Ur: NEGATIVE ng/mL
BARBITURATE SCREEN URINE: NEGATIVE ng/mL
BENZODIAZEPINE SCREEN, URINE: POSITIVE ng/mL — AB
Buprenorphine, Urine: NEGATIVE ng/mL
CANNABINOIDS UR QL SCN: NEGATIVE ng/mL
Cocaine (Metab) Scrn, Ur: NEGATIVE ng/mL
Creatinine(Crt), U: 159.7 mg/dL (ref 20.0–300.0)
Fentanyl, Urine: NEGATIVE pg/mL
Meperidine Screen, Urine: NEGATIVE ng/mL
Methadone Screen, Urine: NEGATIVE ng/mL
OXYCODONE+OXYMORPHONE UR QL SCN: NEGATIVE ng/mL
Opiate Scrn, Ur: POSITIVE ng/mL — AB
Ph of Urine: 5.6 (ref 4.5–8.9)
Phencyclidine Qn, Ur: NEGATIVE ng/mL
Propoxyphene Scrn, Ur: NEGATIVE ng/mL
Tramadol Screen, Urine: NEGATIVE ng/mL

## 2021-11-03 ENCOUNTER — Other Ambulatory Visit: Payer: Self-pay | Admitting: Family Medicine

## 2021-11-03 DIAGNOSIS — E785 Hyperlipidemia, unspecified: Secondary | ICD-10-CM

## 2021-11-03 DIAGNOSIS — F419 Anxiety disorder, unspecified: Secondary | ICD-10-CM

## 2021-11-08 DIAGNOSIS — G4733 Obstructive sleep apnea (adult) (pediatric): Secondary | ICD-10-CM | POA: Diagnosis not present

## 2021-11-08 DIAGNOSIS — K219 Gastro-esophageal reflux disease without esophagitis: Secondary | ICD-10-CM | POA: Diagnosis not present

## 2021-11-08 DIAGNOSIS — F32A Depression, unspecified: Secondary | ICD-10-CM | POA: Diagnosis not present

## 2021-11-08 DIAGNOSIS — M1612 Unilateral primary osteoarthritis, left hip: Secondary | ICD-10-CM | POA: Diagnosis not present

## 2021-11-08 DIAGNOSIS — M25752 Osteophyte, left hip: Secondary | ICD-10-CM | POA: Diagnosis not present

## 2021-11-08 DIAGNOSIS — F419 Anxiety disorder, unspecified: Secondary | ICD-10-CM | POA: Diagnosis not present

## 2021-11-08 DIAGNOSIS — E785 Hyperlipidemia, unspecified: Secondary | ICD-10-CM | POA: Diagnosis not present

## 2021-11-08 DIAGNOSIS — J45909 Unspecified asthma, uncomplicated: Secondary | ICD-10-CM | POA: Diagnosis not present

## 2021-11-08 DIAGNOSIS — M5126 Other intervertebral disc displacement, lumbar region: Secondary | ICD-10-CM | POA: Diagnosis not present

## 2021-11-09 DIAGNOSIS — M1612 Unilateral primary osteoarthritis, left hip: Secondary | ICD-10-CM | POA: Diagnosis not present

## 2021-11-09 DIAGNOSIS — E785 Hyperlipidemia, unspecified: Secondary | ICD-10-CM | POA: Diagnosis not present

## 2021-11-09 DIAGNOSIS — G4733 Obstructive sleep apnea (adult) (pediatric): Secondary | ICD-10-CM | POA: Diagnosis not present

## 2021-11-09 DIAGNOSIS — K219 Gastro-esophageal reflux disease without esophagitis: Secondary | ICD-10-CM | POA: Diagnosis not present

## 2021-11-09 DIAGNOSIS — J45909 Unspecified asthma, uncomplicated: Secondary | ICD-10-CM | POA: Diagnosis not present

## 2021-11-09 DIAGNOSIS — M25752 Osteophyte, left hip: Secondary | ICD-10-CM | POA: Diagnosis not present

## 2021-11-13 DIAGNOSIS — Z6834 Body mass index (BMI) 34.0-34.9, adult: Secondary | ICD-10-CM | POA: Diagnosis not present

## 2021-11-13 DIAGNOSIS — F419 Anxiety disorder, unspecified: Secondary | ICD-10-CM | POA: Diagnosis not present

## 2021-11-13 DIAGNOSIS — E669 Obesity, unspecified: Secondary | ICD-10-CM | POA: Diagnosis not present

## 2021-11-13 DIAGNOSIS — Z471 Aftercare following joint replacement surgery: Secondary | ICD-10-CM | POA: Diagnosis not present

## 2021-11-13 DIAGNOSIS — K219 Gastro-esophageal reflux disease without esophagitis: Secondary | ICD-10-CM | POA: Diagnosis not present

## 2021-11-13 DIAGNOSIS — Z87891 Personal history of nicotine dependence: Secondary | ICD-10-CM | POA: Diagnosis not present

## 2021-11-13 DIAGNOSIS — J45909 Unspecified asthma, uncomplicated: Secondary | ICD-10-CM | POA: Diagnosis not present

## 2021-11-13 DIAGNOSIS — E785 Hyperlipidemia, unspecified: Secondary | ICD-10-CM | POA: Diagnosis not present

## 2021-11-13 DIAGNOSIS — Z7982 Long term (current) use of aspirin: Secondary | ICD-10-CM | POA: Diagnosis not present

## 2021-11-13 DIAGNOSIS — Z96642 Presence of left artificial hip joint: Secondary | ICD-10-CM | POA: Diagnosis not present

## 2021-11-13 DIAGNOSIS — G8929 Other chronic pain: Secondary | ICD-10-CM | POA: Diagnosis not present

## 2021-11-13 DIAGNOSIS — G4733 Obstructive sleep apnea (adult) (pediatric): Secondary | ICD-10-CM | POA: Diagnosis not present

## 2021-11-13 DIAGNOSIS — Z9181 History of falling: Secondary | ICD-10-CM | POA: Diagnosis not present

## 2021-11-13 DIAGNOSIS — F32A Depression, unspecified: Secondary | ICD-10-CM | POA: Diagnosis not present

## 2021-11-13 DIAGNOSIS — Z87442 Personal history of urinary calculi: Secondary | ICD-10-CM | POA: Diagnosis not present

## 2021-11-13 DIAGNOSIS — L409 Psoriasis, unspecified: Secondary | ICD-10-CM | POA: Diagnosis not present

## 2021-11-14 DIAGNOSIS — Z471 Aftercare following joint replacement surgery: Secondary | ICD-10-CM | POA: Diagnosis not present

## 2021-11-14 DIAGNOSIS — F32A Depression, unspecified: Secondary | ICD-10-CM | POA: Diagnosis not present

## 2021-11-14 DIAGNOSIS — J45909 Unspecified asthma, uncomplicated: Secondary | ICD-10-CM | POA: Diagnosis not present

## 2021-11-14 DIAGNOSIS — G8929 Other chronic pain: Secondary | ICD-10-CM | POA: Diagnosis not present

## 2021-11-14 DIAGNOSIS — Z96642 Presence of left artificial hip joint: Secondary | ICD-10-CM | POA: Diagnosis not present

## 2021-11-14 DIAGNOSIS — F419 Anxiety disorder, unspecified: Secondary | ICD-10-CM | POA: Diagnosis not present

## 2021-11-19 DIAGNOSIS — F32A Depression, unspecified: Secondary | ICD-10-CM | POA: Diagnosis not present

## 2021-11-19 DIAGNOSIS — G8929 Other chronic pain: Secondary | ICD-10-CM | POA: Diagnosis not present

## 2021-11-19 DIAGNOSIS — J45909 Unspecified asthma, uncomplicated: Secondary | ICD-10-CM | POA: Diagnosis not present

## 2021-11-19 DIAGNOSIS — Z96642 Presence of left artificial hip joint: Secondary | ICD-10-CM | POA: Diagnosis not present

## 2021-11-19 DIAGNOSIS — Z471 Aftercare following joint replacement surgery: Secondary | ICD-10-CM | POA: Diagnosis not present

## 2021-11-19 DIAGNOSIS — F419 Anxiety disorder, unspecified: Secondary | ICD-10-CM | POA: Diagnosis not present

## 2021-11-21 DIAGNOSIS — F419 Anxiety disorder, unspecified: Secondary | ICD-10-CM | POA: Diagnosis not present

## 2021-11-21 DIAGNOSIS — J45909 Unspecified asthma, uncomplicated: Secondary | ICD-10-CM | POA: Diagnosis not present

## 2021-11-21 DIAGNOSIS — Z96642 Presence of left artificial hip joint: Secondary | ICD-10-CM | POA: Diagnosis not present

## 2021-11-21 DIAGNOSIS — F32A Depression, unspecified: Secondary | ICD-10-CM | POA: Diagnosis not present

## 2021-11-21 DIAGNOSIS — G8929 Other chronic pain: Secondary | ICD-10-CM | POA: Diagnosis not present

## 2021-11-21 DIAGNOSIS — Z471 Aftercare following joint replacement surgery: Secondary | ICD-10-CM | POA: Diagnosis not present

## 2021-11-23 DIAGNOSIS — J45909 Unspecified asthma, uncomplicated: Secondary | ICD-10-CM | POA: Diagnosis not present

## 2021-11-23 DIAGNOSIS — Z96642 Presence of left artificial hip joint: Secondary | ICD-10-CM | POA: Diagnosis not present

## 2021-11-23 DIAGNOSIS — F419 Anxiety disorder, unspecified: Secondary | ICD-10-CM | POA: Diagnosis not present

## 2021-11-23 DIAGNOSIS — Z471 Aftercare following joint replacement surgery: Secondary | ICD-10-CM | POA: Diagnosis not present

## 2021-11-23 DIAGNOSIS — F32A Depression, unspecified: Secondary | ICD-10-CM | POA: Diagnosis not present

## 2021-11-23 DIAGNOSIS — G8929 Other chronic pain: Secondary | ICD-10-CM | POA: Diagnosis not present

## 2021-11-26 DIAGNOSIS — Z471 Aftercare following joint replacement surgery: Secondary | ICD-10-CM | POA: Diagnosis not present

## 2021-11-26 DIAGNOSIS — J45909 Unspecified asthma, uncomplicated: Secondary | ICD-10-CM | POA: Diagnosis not present

## 2021-11-26 DIAGNOSIS — G8929 Other chronic pain: Secondary | ICD-10-CM | POA: Diagnosis not present

## 2021-11-26 DIAGNOSIS — F419 Anxiety disorder, unspecified: Secondary | ICD-10-CM | POA: Diagnosis not present

## 2021-11-26 DIAGNOSIS — Z96642 Presence of left artificial hip joint: Secondary | ICD-10-CM | POA: Diagnosis not present

## 2021-11-26 DIAGNOSIS — F32A Depression, unspecified: Secondary | ICD-10-CM | POA: Diagnosis not present

## 2021-11-28 DIAGNOSIS — F32A Depression, unspecified: Secondary | ICD-10-CM | POA: Diagnosis not present

## 2021-11-28 DIAGNOSIS — G8929 Other chronic pain: Secondary | ICD-10-CM | POA: Diagnosis not present

## 2021-11-28 DIAGNOSIS — F419 Anxiety disorder, unspecified: Secondary | ICD-10-CM | POA: Diagnosis not present

## 2021-11-28 DIAGNOSIS — J45909 Unspecified asthma, uncomplicated: Secondary | ICD-10-CM | POA: Diagnosis not present

## 2021-11-28 DIAGNOSIS — Z471 Aftercare following joint replacement surgery: Secondary | ICD-10-CM | POA: Diagnosis not present

## 2021-11-28 DIAGNOSIS — Z96642 Presence of left artificial hip joint: Secondary | ICD-10-CM | POA: Diagnosis not present

## 2021-11-30 ENCOUNTER — Other Ambulatory Visit: Payer: Self-pay | Admitting: Family Medicine

## 2021-11-30 DIAGNOSIS — F419 Anxiety disorder, unspecified: Secondary | ICD-10-CM | POA: Diagnosis not present

## 2021-11-30 DIAGNOSIS — G8929 Other chronic pain: Secondary | ICD-10-CM | POA: Diagnosis not present

## 2021-11-30 DIAGNOSIS — F32A Depression, unspecified: Secondary | ICD-10-CM | POA: Diagnosis not present

## 2021-11-30 DIAGNOSIS — J45909 Unspecified asthma, uncomplicated: Secondary | ICD-10-CM | POA: Diagnosis not present

## 2021-11-30 DIAGNOSIS — Z471 Aftercare following joint replacement surgery: Secondary | ICD-10-CM | POA: Diagnosis not present

## 2021-11-30 DIAGNOSIS — Z96642 Presence of left artificial hip joint: Secondary | ICD-10-CM | POA: Diagnosis not present

## 2021-11-30 NOTE — Telephone Encounter (Signed)
Requested medication (s) are due for refill today - yes  Requested medication (s) are on the active medication list -yes  Future visit scheduled -no  Last refill: 10/16/21 #120  Notes to clinic: Request RF: non delegated Rx  Requested Prescriptions  Pending Prescriptions Disp Refills   HYDROcodone-acetaminophen (NORCO) 7.5-325 MG tablet 120 tablet 0    Sig: Take 1 tablet by mouth 4 (four) times daily as needed for moderate pain.     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

## 2021-11-30 NOTE — Telephone Encounter (Signed)
Copied from [place] [id number]. Topic: Quick Communication - Rx Refill/Question >> Nov 30, 2021  3:21 PM Leward Quan A wrote: Medication: HYDROcodone-acetaminophen ([place]) 7.5-325 MG tablet   Has the patient contacted their pharmacy? No. Out of town recovering from surgery (Agent: If no, request that the patient contact the pharmacy for the refill. If patient does not wish to contact the pharmacy document the reason why and proceed with request.) (Agent: If yes, when and what did the pharmacy advise?)  Preferred Pharmacy (with phone number or street name): [place] [id number] Has the patient been seen for an appointment in the last year OR does the patient have an upcoming appointment? Yes.    Agent: Please be advised that RX refills may take up to 3 business days. We ask that you follow-up with your pharmacy.

## 2021-11-30 NOTE — Telephone Encounter (Signed)
LOV: 10/29/2021   Thanks,   -Mickel Baas

## 2021-12-03 DIAGNOSIS — J45909 Unspecified asthma, uncomplicated: Secondary | ICD-10-CM | POA: Diagnosis not present

## 2021-12-03 DIAGNOSIS — Z471 Aftercare following joint replacement surgery: Secondary | ICD-10-CM | POA: Diagnosis not present

## 2021-12-03 DIAGNOSIS — F419 Anxiety disorder, unspecified: Secondary | ICD-10-CM | POA: Diagnosis not present

## 2021-12-03 DIAGNOSIS — F32A Depression, unspecified: Secondary | ICD-10-CM | POA: Diagnosis not present

## 2021-12-03 DIAGNOSIS — G8929 Other chronic pain: Secondary | ICD-10-CM | POA: Diagnosis not present

## 2021-12-03 DIAGNOSIS — Z96642 Presence of left artificial hip joint: Secondary | ICD-10-CM | POA: Diagnosis not present

## 2021-12-03 MED ORDER — HYDROCODONE-ACETAMINOPHEN 7.5-325 MG PO TABS: 1.0000 | ORAL_TABLET | Freq: Four times a day (QID) | ORAL | 0 refills | Status: DC | PRN

## 2021-12-05 DIAGNOSIS — F32A Depression, unspecified: Secondary | ICD-10-CM | POA: Diagnosis not present

## 2021-12-05 DIAGNOSIS — G8929 Other chronic pain: Secondary | ICD-10-CM | POA: Diagnosis not present

## 2021-12-05 DIAGNOSIS — J45909 Unspecified asthma, uncomplicated: Secondary | ICD-10-CM | POA: Diagnosis not present

## 2021-12-05 DIAGNOSIS — Z96642 Presence of left artificial hip joint: Secondary | ICD-10-CM | POA: Diagnosis not present

## 2021-12-05 DIAGNOSIS — F419 Anxiety disorder, unspecified: Secondary | ICD-10-CM | POA: Diagnosis not present

## 2021-12-05 DIAGNOSIS — Z471 Aftercare following joint replacement surgery: Secondary | ICD-10-CM | POA: Diagnosis not present

## 2021-12-07 DIAGNOSIS — Z471 Aftercare following joint replacement surgery: Secondary | ICD-10-CM | POA: Diagnosis not present

## 2021-12-07 DIAGNOSIS — F32A Depression, unspecified: Secondary | ICD-10-CM | POA: Diagnosis not present

## 2021-12-07 DIAGNOSIS — Z96642 Presence of left artificial hip joint: Secondary | ICD-10-CM | POA: Diagnosis not present

## 2021-12-07 DIAGNOSIS — J45909 Unspecified asthma, uncomplicated: Secondary | ICD-10-CM | POA: Diagnosis not present

## 2021-12-07 DIAGNOSIS — G8929 Other chronic pain: Secondary | ICD-10-CM | POA: Diagnosis not present

## 2021-12-07 DIAGNOSIS — F419 Anxiety disorder, unspecified: Secondary | ICD-10-CM | POA: Diagnosis not present

## 2021-12-11 DIAGNOSIS — Z471 Aftercare following joint replacement surgery: Secondary | ICD-10-CM | POA: Diagnosis not present

## 2021-12-11 DIAGNOSIS — F419 Anxiety disorder, unspecified: Secondary | ICD-10-CM | POA: Diagnosis not present

## 2021-12-11 DIAGNOSIS — G8929 Other chronic pain: Secondary | ICD-10-CM | POA: Diagnosis not present

## 2021-12-11 DIAGNOSIS — J45909 Unspecified asthma, uncomplicated: Secondary | ICD-10-CM | POA: Diagnosis not present

## 2021-12-11 DIAGNOSIS — F32A Depression, unspecified: Secondary | ICD-10-CM | POA: Diagnosis not present

## 2021-12-11 DIAGNOSIS — Z96642 Presence of left artificial hip joint: Secondary | ICD-10-CM | POA: Diagnosis not present

## 2021-12-13 DIAGNOSIS — E669 Obesity, unspecified: Secondary | ICD-10-CM | POA: Diagnosis not present

## 2021-12-13 DIAGNOSIS — L409 Psoriasis, unspecified: Secondary | ICD-10-CM | POA: Diagnosis not present

## 2021-12-13 DIAGNOSIS — K219 Gastro-esophageal reflux disease without esophagitis: Secondary | ICD-10-CM | POA: Diagnosis not present

## 2021-12-13 DIAGNOSIS — Z7982 Long term (current) use of aspirin: Secondary | ICD-10-CM | POA: Diagnosis not present

## 2021-12-13 DIAGNOSIS — F419 Anxiety disorder, unspecified: Secondary | ICD-10-CM | POA: Diagnosis not present

## 2021-12-13 DIAGNOSIS — G4733 Obstructive sleep apnea (adult) (pediatric): Secondary | ICD-10-CM | POA: Diagnosis not present

## 2021-12-13 DIAGNOSIS — J45909 Unspecified asthma, uncomplicated: Secondary | ICD-10-CM | POA: Diagnosis not present

## 2021-12-13 DIAGNOSIS — Z471 Aftercare following joint replacement surgery: Secondary | ICD-10-CM | POA: Diagnosis not present

## 2021-12-13 DIAGNOSIS — G8929 Other chronic pain: Secondary | ICD-10-CM | POA: Diagnosis not present

## 2021-12-13 DIAGNOSIS — Z9181 History of falling: Secondary | ICD-10-CM | POA: Diagnosis not present

## 2021-12-13 DIAGNOSIS — Z96642 Presence of left artificial hip joint: Secondary | ICD-10-CM | POA: Diagnosis not present

## 2021-12-13 DIAGNOSIS — E785 Hyperlipidemia, unspecified: Secondary | ICD-10-CM | POA: Diagnosis not present

## 2021-12-13 DIAGNOSIS — Z87891 Personal history of nicotine dependence: Secondary | ICD-10-CM | POA: Diagnosis not present

## 2021-12-13 DIAGNOSIS — F32A Depression, unspecified: Secondary | ICD-10-CM | POA: Diagnosis not present

## 2021-12-13 DIAGNOSIS — Z6834 Body mass index (BMI) 34.0-34.9, adult: Secondary | ICD-10-CM | POA: Diagnosis not present

## 2021-12-13 DIAGNOSIS — Z87442 Personal history of urinary calculi: Secondary | ICD-10-CM | POA: Diagnosis not present

## 2021-12-14 DIAGNOSIS — G8929 Other chronic pain: Secondary | ICD-10-CM | POA: Diagnosis not present

## 2021-12-14 DIAGNOSIS — J45909 Unspecified asthma, uncomplicated: Secondary | ICD-10-CM | POA: Diagnosis not present

## 2021-12-14 DIAGNOSIS — Z471 Aftercare following joint replacement surgery: Secondary | ICD-10-CM | POA: Diagnosis not present

## 2021-12-14 DIAGNOSIS — F419 Anxiety disorder, unspecified: Secondary | ICD-10-CM | POA: Diagnosis not present

## 2021-12-14 DIAGNOSIS — Z96642 Presence of left artificial hip joint: Secondary | ICD-10-CM | POA: Diagnosis not present

## 2021-12-14 DIAGNOSIS — F32A Depression, unspecified: Secondary | ICD-10-CM | POA: Diagnosis not present

## 2021-12-17 DIAGNOSIS — F32A Depression, unspecified: Secondary | ICD-10-CM | POA: Diagnosis not present

## 2021-12-17 DIAGNOSIS — F419 Anxiety disorder, unspecified: Secondary | ICD-10-CM | POA: Diagnosis not present

## 2021-12-17 DIAGNOSIS — G8929 Other chronic pain: Secondary | ICD-10-CM | POA: Diagnosis not present

## 2021-12-17 DIAGNOSIS — Z96642 Presence of left artificial hip joint: Secondary | ICD-10-CM | POA: Diagnosis not present

## 2021-12-17 DIAGNOSIS — Z471 Aftercare following joint replacement surgery: Secondary | ICD-10-CM | POA: Diagnosis not present

## 2021-12-17 DIAGNOSIS — J45909 Unspecified asthma, uncomplicated: Secondary | ICD-10-CM | POA: Diagnosis not present

## 2021-12-20 DIAGNOSIS — Z471 Aftercare following joint replacement surgery: Secondary | ICD-10-CM | POA: Diagnosis not present

## 2021-12-20 DIAGNOSIS — G8929 Other chronic pain: Secondary | ICD-10-CM | POA: Diagnosis not present

## 2021-12-20 DIAGNOSIS — F32A Depression, unspecified: Secondary | ICD-10-CM | POA: Diagnosis not present

## 2021-12-20 DIAGNOSIS — F419 Anxiety disorder, unspecified: Secondary | ICD-10-CM | POA: Diagnosis not present

## 2021-12-20 DIAGNOSIS — J45909 Unspecified asthma, uncomplicated: Secondary | ICD-10-CM | POA: Diagnosis not present

## 2021-12-20 DIAGNOSIS — Z96642 Presence of left artificial hip joint: Secondary | ICD-10-CM | POA: Diagnosis not present

## 2021-12-21 DIAGNOSIS — Z96642 Presence of left artificial hip joint: Secondary | ICD-10-CM | POA: Diagnosis not present

## 2021-12-24 DIAGNOSIS — Z471 Aftercare following joint replacement surgery: Secondary | ICD-10-CM | POA: Diagnosis not present

## 2021-12-24 DIAGNOSIS — J45909 Unspecified asthma, uncomplicated: Secondary | ICD-10-CM | POA: Diagnosis not present

## 2021-12-24 DIAGNOSIS — G8929 Other chronic pain: Secondary | ICD-10-CM | POA: Diagnosis not present

## 2021-12-24 DIAGNOSIS — F419 Anxiety disorder, unspecified: Secondary | ICD-10-CM | POA: Diagnosis not present

## 2021-12-24 DIAGNOSIS — F32A Depression, unspecified: Secondary | ICD-10-CM | POA: Diagnosis not present

## 2021-12-24 DIAGNOSIS — Z96642 Presence of left artificial hip joint: Secondary | ICD-10-CM | POA: Diagnosis not present

## 2021-12-27 DIAGNOSIS — F32A Depression, unspecified: Secondary | ICD-10-CM | POA: Diagnosis not present

## 2021-12-27 DIAGNOSIS — Z96642 Presence of left artificial hip joint: Secondary | ICD-10-CM | POA: Diagnosis not present

## 2021-12-27 DIAGNOSIS — F419 Anxiety disorder, unspecified: Secondary | ICD-10-CM | POA: Diagnosis not present

## 2021-12-27 DIAGNOSIS — Z471 Aftercare following joint replacement surgery: Secondary | ICD-10-CM | POA: Diagnosis not present

## 2021-12-27 DIAGNOSIS — J45909 Unspecified asthma, uncomplicated: Secondary | ICD-10-CM | POA: Diagnosis not present

## 2021-12-27 DIAGNOSIS — G8929 Other chronic pain: Secondary | ICD-10-CM | POA: Diagnosis not present

## 2022-01-01 ENCOUNTER — Other Ambulatory Visit: Payer: Self-pay | Admitting: Family Medicine

## 2022-01-01 NOTE — Telephone Encounter (Signed)
Medication Refill - Medication: HYDROcodone-acetaminophen (NORCO) 7.5-325 MG tablet  Has the patient contacted their pharmacy? No. (Agent: If no, request that the patient contact the pharmacy for the refill. If patient does not wish to contact the pharmacy document the reason why and proceed with request.) (Agent: If yes, when and what did the pharmacy advise?)always told to call this in to office   Preferred Pharmacy (with phone number or street name): [id number] [place], [place] - [address]  [address] [place] [address]  Phone:  [phone]  Fax:  [phone]  Has the patient been seen for an appointment in the last year OR does the patient have an upcoming appointment? Yes.    Agent: Please be advised that RX refills may take up to 3 business days. We ask that you follow-up with your pharmacy.

## 2022-01-01 NOTE — Telephone Encounter (Signed)
Requested medications are due for refill today.  yes  Requested medications are on the active medications list.  yes  Last refill. 12/03/2021 #120 0 refills  Future visit scheduled.   no  Notes to clinic.  Medication not delegated.    Requested Prescriptions  Pending Prescriptions Disp Refills   HYDROcodone-acetaminophen (NORCO) 7.5-325 MG tablet 120 tablet 0    Sig: Take 1 tablet by mouth 4 (four) times daily as needed for moderate pain.     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

## 2022-01-02 MED ORDER — HYDROCODONE-ACETAMINOPHEN 7.5-325 MG PO TABS: 1.0000 | ORAL_TABLET | Freq: Four times a day (QID) | ORAL | 0 refills | Status: DC | PRN

## 2022-01-03 DIAGNOSIS — F419 Anxiety disorder, unspecified: Secondary | ICD-10-CM | POA: Diagnosis not present

## 2022-01-03 DIAGNOSIS — Z96642 Presence of left artificial hip joint: Secondary | ICD-10-CM | POA: Diagnosis not present

## 2022-01-03 DIAGNOSIS — F32A Depression, unspecified: Secondary | ICD-10-CM | POA: Diagnosis not present

## 2022-01-03 DIAGNOSIS — Z471 Aftercare following joint replacement surgery: Secondary | ICD-10-CM | POA: Diagnosis not present

## 2022-01-03 DIAGNOSIS — G8929 Other chronic pain: Secondary | ICD-10-CM | POA: Diagnosis not present

## 2022-01-03 DIAGNOSIS — J45909 Unspecified asthma, uncomplicated: Secondary | ICD-10-CM | POA: Diagnosis not present

## 2022-01-05 DIAGNOSIS — F419 Anxiety disorder, unspecified: Secondary | ICD-10-CM | POA: Diagnosis not present

## 2022-01-05 DIAGNOSIS — Z96642 Presence of left artificial hip joint: Secondary | ICD-10-CM | POA: Diagnosis not present

## 2022-01-05 DIAGNOSIS — F32A Depression, unspecified: Secondary | ICD-10-CM | POA: Diagnosis not present

## 2022-01-05 DIAGNOSIS — G8929 Other chronic pain: Secondary | ICD-10-CM | POA: Diagnosis not present

## 2022-01-05 DIAGNOSIS — J45909 Unspecified asthma, uncomplicated: Secondary | ICD-10-CM | POA: Diagnosis not present

## 2022-01-05 DIAGNOSIS — Z471 Aftercare following joint replacement surgery: Secondary | ICD-10-CM | POA: Diagnosis not present

## 2022-01-07 ENCOUNTER — Telehealth: Payer: Self-pay | Admitting: Family Medicine

## 2022-01-07 DIAGNOSIS — Z471 Aftercare following joint replacement surgery: Secondary | ICD-10-CM | POA: Diagnosis not present

## 2022-01-07 DIAGNOSIS — J45909 Unspecified asthma, uncomplicated: Secondary | ICD-10-CM | POA: Diagnosis not present

## 2022-01-07 DIAGNOSIS — F32A Depression, unspecified: Secondary | ICD-10-CM | POA: Diagnosis not present

## 2022-01-07 DIAGNOSIS — K219 Gastro-esophageal reflux disease without esophagitis: Secondary | ICD-10-CM

## 2022-01-07 DIAGNOSIS — G8929 Other chronic pain: Secondary | ICD-10-CM | POA: Diagnosis not present

## 2022-01-07 DIAGNOSIS — Z96642 Presence of left artificial hip joint: Secondary | ICD-10-CM | POA: Diagnosis not present

## 2022-01-07 DIAGNOSIS — F419 Anxiety disorder, unspecified: Secondary | ICD-10-CM | POA: Diagnosis not present

## 2022-01-07 MED ORDER — ESOMEPRAZOLE MAGNESIUM 40 MG PO CPDR: DELAYED_RELEASE_CAPSULE | ORAL | 4 refills | Status: DC

## 2022-01-07 NOTE — Telephone Encounter (Signed)
[facility] faxed refill request for the following medications:   esomeprazole (NEXIUM) 40 MG capsule    Please advise.  

## 2022-01-10 DIAGNOSIS — Z96642 Presence of left artificial hip joint: Secondary | ICD-10-CM | POA: Diagnosis not present

## 2022-01-10 DIAGNOSIS — Z471 Aftercare following joint replacement surgery: Secondary | ICD-10-CM | POA: Diagnosis not present

## 2022-01-10 DIAGNOSIS — F32A Depression, unspecified: Secondary | ICD-10-CM | POA: Diagnosis not present

## 2022-01-10 DIAGNOSIS — F419 Anxiety disorder, unspecified: Secondary | ICD-10-CM | POA: Diagnosis not present

## 2022-01-10 DIAGNOSIS — G8929 Other chronic pain: Secondary | ICD-10-CM | POA: Diagnosis not present

## 2022-01-10 DIAGNOSIS — J45909 Unspecified asthma, uncomplicated: Secondary | ICD-10-CM | POA: Diagnosis not present

## 2022-02-07 ENCOUNTER — Other Ambulatory Visit: Payer: Self-pay | Admitting: Family Medicine

## 2022-02-07 NOTE — Telephone Encounter (Signed)
Medication Refill - Medication: HYDROcodone-acetaminophen (NORCO) 7.5-325 MG tablet  ? ?Has the patient contacted their pharmacy? No. ? ?(Agent: If no, request that the patient contact the pharmacy for the refill. If patient does not wish to contact the pharmacy document the reason why and proceed with request.) Pharmacy advised patient to call PCP for this particular medication ? ? ?Preferred Pharmacy (with phone number or street name):  ?[id number] [address]  [phone] ?Fax:  [phone] ?  ? ? ? ?Has the patient been seen for an appointment in the last year OR does the patient have an upcoming appointment? Yes.   ? ?Agent: Please be advised that RX refills may take up to 3 business days. We ask that you follow-up with your pharmacy. ?

## 2022-02-07 NOTE — Telephone Encounter (Signed)
Please review for refill. Refill not delegated per protocol.  ?Last refill: 01/02/22 ?Last OV: 10/29/21 ?

## 2022-02-09 MED ORDER — HYDROCODONE-ACETAMINOPHEN 7.5-325 MG PO TABS: 1.0000 | ORAL_TABLET | Freq: Four times a day (QID) | ORAL | 0 refills | Status: DC | PRN

## 2022-02-19 ENCOUNTER — Other Ambulatory Visit: Payer: Self-pay | Admitting: Family Medicine

## 2022-02-19 DIAGNOSIS — F419 Anxiety disorder, unspecified: Secondary | ICD-10-CM

## 2022-03-01 DIAGNOSIS — M1711 Unilateral primary osteoarthritis, right knee: Secondary | ICD-10-CM | POA: Diagnosis not present

## 2022-03-11 ENCOUNTER — Other Ambulatory Visit: Payer: Self-pay | Admitting: Family Medicine

## 2022-03-11 NOTE — Telephone Encounter (Signed)
Medication Refill - Medication: HYDROcodone-acetaminophen (NORCO) 7.5-325 MG tablet  ? ? ?Has the patient contacted their pharmacy? No. ?(Agent: If no, request that the patient contact the pharmacy for the refill. If patient does not wish to contact the pharmacy document the reason why and proceed with request.) ?(Agent: If yes, when and what did the pharmacy advise?) ? ?Preferred Pharmacy (with phone number or street name): [id number] [place] [address] [id number] ?Phone:  [phone] Fax:  [phone] ?DEA #:  [id number]?Has the patient been seen for an appointment in the last year OR does the patient have an upcoming appointment? Yes.   ? ?Agent: Please be advised that RX refills may take up to 3 business days. We ask that you follow-up with your pharmacy. ?

## 2022-03-12 MED ORDER — HYDROCODONE-ACETAMINOPHEN 7.5-325 MG PO TABS: 1.0000 | ORAL_TABLET | Freq: Four times a day (QID) | ORAL | 0 refills | Status: DC | PRN

## 2022-03-12 NOTE — Telephone Encounter (Signed)
Requested medications are due for refill today.  yes ? ?Requested medications are on the active medications list.  yes ? ?Last refill. 02/09/2022 #120 0 refills ? ?Future visit scheduled.   no ? ?Notes to clinic.  Medication refill is not delegated. ? ? ? ?Requested Prescriptions  ?Pending Prescriptions Disp Refills  ? HYDROcodone-acetaminophen (NORCO) 7.5-325 MG tablet 120 tablet 0  ?  Sig: Take 1 tablet by mouth 4 (four) times daily as needed for moderate pain.  ?  ? Not Delegated - Analgesics:  Opioid Agonist Combinations Failed - 03/11/2022  3:18 PM  ?  ?  Failed - This refill cannot be delegated  ?  ?  Failed - Urine Drug Screen completed in last 360 days  ?  ?  Failed - Valid encounter within last 3 months  ?  Recent Outpatient Visits   ? ?      ? 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 [facility] [name], MD  ? 9 months ago Pure hypercholesterolemia  ? [facility] [name], MD  ? 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 [facility] Caryn Section, Kirstie Peri, MD  ? 1 year ago Vertigo  ? [place], [name]  ? 1 year ago Other fatigue  ? [facility] Caryn Section, Kirstie Peri, MD  ? ?  ?  ? ? ?  ?  ?  ?  ?

## 2022-03-18 ENCOUNTER — Telehealth: Payer: Self-pay | Admitting: Family Medicine

## 2022-03-21 ENCOUNTER — Telehealth: Payer: Self-pay | Admitting: Family Medicine

## 2022-03-21 NOTE — Telephone Encounter (Signed)
PT called to clarify HYDROcodone-acetaminophen ([place]) 7.5-325 MG tablet is what she takes, [facility], she needs another med for replacment! ?

## 2022-03-21 NOTE — Telephone Encounter (Signed)
[facility] following medications: ? ? ?HYDROcodone-acetaminophen (NORCO) 7.5-325 MG tablet  ? ?Please advise. ? ?

## 2022-03-26 NOTE — Telephone Encounter (Signed)
Patient called in and stated that she would like for Dr Caryn Section to please prescribe something other than the Hydrocodone since the pharmacy is out and she have been trying to get a replacement for 2 weeks. Say that she is willing to take  Oxycodone or something else she just does not want to be without any pain medication. Please advise and call patient today at Ph#  [phone] ?

## 2022-03-28 ENCOUNTER — Telehealth: Payer: Self-pay

## 2022-03-28 MED ORDER — HYDROCODONE-ACETAMINOPHEN 7.5-325 MG PO TABS: 1.0000 | ORAL_TABLET | Freq: Four times a day (QID) | ORAL | 0 refills | Status: DC | PRN

## 2022-03-28 NOTE — Addendum Note (Signed)
Addended by: [name] on: 03/28/2022 04:43 PM ? ? Modules accepted: Orders ? ?

## 2022-03-28 NOTE — Telephone Encounter (Signed)
Hydrocodone prescription sent to [facility] ?

## 2022-03-28 NOTE — Telephone Encounter (Signed)
Pt called and stated that [facility] HYDR[facility]-acetaminophen ([place]) 7.5-325 MG tablet. Pt states that she would like a similar medication prescribed, since the pharmacy is out of the Hydrocodone. Patient states this is the 3rd time inquiring about this medication and would like for Dr.Fisher's CMA to call with an update. ? ?Patient states that if a similar prescription can not be called in that she would like the Hydrocodone sent to [facility], [address] [id number] ?

## 2022-04-19 ENCOUNTER — Other Ambulatory Visit: Payer: Self-pay | Admitting: Family Medicine

## 2022-04-19 NOTE — Telephone Encounter (Signed)
Medication Refill - Medication: HYDROcodone-acetaminophen (NORCO) 7.5-325 MG tablet  Has the patient contacted their pharmacy?  No  Preferred Pharmacy (with phone number or street name): [id number] - [place], [place]):  Has the patient been seen for an appointment in the last year OR does the patient have an upcoming appointment? Yes.    Agent: Please be advised that RX refills may take up to 3 business days. We ask that you follow-up with your pharmacy.

## 2022-04-19 NOTE — Telephone Encounter (Signed)
Requested medication (s) are due for refill today -provider reveiw  Requested medication (s) are on the active medication list -yes  Future visit scheduled -no  Last refill: 03/28/22 #120  Notes to clinic: non delegated Rx  Requested Prescriptions  Pending Prescriptions Disp Refills   HYDROcodone-acetaminophen (NORCO) 7.5-325 MG tablet 120 tablet 0    Sig: Take 1 tablet by mouth 4 (four) times daily as needed for moderate pain.     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

## 2022-04-25 MED ORDER — HYDROCODONE-ACETAMINOPHEN 7.5-325 MG PO TABS: 1.0000 | ORAL_TABLET | Freq: Four times a day (QID) | ORAL | 0 refills | Status: DC | PRN

## 2022-05-01 ENCOUNTER — Encounter: Payer: Medicare Other | Admitting: Family Medicine

## 2022-05-07 NOTE — Progress Notes (Unsigned)
I,Alejandra Little,acting as a scribe for Lelon Huh, MD.,have documented all relevant documentation on the behalf of Lelon Huh, MD,as directed by  Lelon Huh, MD while in the presence of Lelon Huh, MD.  Annual Wellness Visit     Patient: Alejandra Little, Female    DOB: [birth year], 66 y.o.   MRN: [id number] Visit Date: 05/08/2022  Today's Provider: Lelon Huh, MD   Chief Complaint  Patient presents with   Hyperlipidemia   Pain Management   Medicare Wellness   Subjective    Alejandra Little is a 66 y.o. female who presents today for her Annual Wellness Visit. She reports consuming a general diet. The patient does not participate in regular exercise at present. She generally feels well. She reports sleeping well. She does not have additional problems to discuss today.    She reports low back pain which she feels is exacerbated by lower abdominal panus. She states she had breast reduction surgery many years ago which aleviated chronic back pain she was experiencing at that time and is wondering if surgical removal of lower abdominal panus would help her current back pain. 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   Lipid/Cholesterol, Follow-up  Last lipid panel Other pertinent labs  Lab Results  Component Value Date   CHOL 194 06/11/2021   HDL 43 06/11/2021   LDLCALC 122 (H) 06/11/2021   TRIG 165 (H) 06/11/2021   CHOLHDL 4.5 (H) 06/11/2021   Lab Results  Component Value Date   ALT 8 06/11/2021   AST 9 06/11/2021   PLT 149 (L) 06/11/2021   TSH 1.470 05/29/2020      She reports excellent compliance with treatment. She is not having side effects.   Symptoms: No chest pain No chest pressure/discomfort  No dyspnea No lower extremity edema  No numbness or tingling of extremity No orthopnea  No palpitations No paroxysmal nocturnal dyspnea  No speech difficulty No syncope      The 10-year ASCVD risk score (Arnett DK, et al., 2019) is: 9.4%  ---------------------------------------------------------------------------------------------------     Medications: Outpatient Medications Prior to Visit  Medication Sig   ALPRAZolam (XANAX) 0.5 MG tablet TAKE 1-2 TABLETS (0.5-1 MG TOTAL) BY MOUTH AT BEDTIME.   esomeprazole (NEXIUM) 40 MG capsule TAKE 1 CAPSULE BY MOUTH EVERY DAY   FLUoxetine (PROZAC) 20 MG capsule TAKE 3 CAPSULES BY MOUTH EVERY DAY   gabapentin (NEURONTIN) 300 MG capsule TAKE 1 CAPSULE ('300MG'$ ) BY MOUTH THREE TIMES DAILY AND TAKE 2 CAPSULES ('600MG'$ ) BY MOUTH AT BEDTIME   HYDROcodone-acetaminophen (NORCO) 7.5-325 MG tablet Take 1 tablet by mouth 4 (four) times daily as needed for moderate pain.   meclizine (ANTIVERT) 25 MG tablet Take 1 tablet (25 mg total) by mouth 3 (three) times daily as needed for dizziness.   pravastatin (PRAVACHOL) 40 MG tablet TAKE 1 TABLET BY MOUTH EVERYDAY AT BEDTIME   Specialty Vitamins Products ([employer] HAIR/SKIN/NAILS) TABS Take 1 tablet by mouth daily.    No facility-administered medications prior to visit.    Allergies  Allergen Reactions   Dexilant [Dexlansoprazole] Diarrhea    Patient Care Team: [name], MD as PCP - General (Family Medicine) Zara Council as Physician Assistant (Orthopedic Surgery) Lovell Sheehan, MD as Consulting Physician (Orthopedic Surgery)  Review of Systems  Constitutional: Negative.   Eyes: Negative.   Respiratory: Negative.    Cardiovascular: Negative.   Gastrointestinal:  Positive for abdominal distention.  Endocrine: Negative.   Genitourinary: Negative.   Musculoskeletal:  Positive for back pain.  Skin: Negative.   Allergic/Immunologic: Negative.   Neurological: Negative.   Hematological: Negative.   Psychiatric/Behavioral: Negative.          Objective    Vitals: BP 109/70 (BP Location: Left Arm, Patient Position: Sitting, Cuff Size: Large)   Pulse 65    Temp 98.5 F (36.9 C) (Oral)   Resp 16   Ht '5\' 5"'$  (1.651 m)   Wt 214 lb 12.8 oz (97.4 kg)   SpO2 97%   BMI 35.74 kg/m      General: Appearance:    Obese female in no acute distress  Eyes:    PERRL, conjunctiva/corneas clear, EOM's intact       Lungs:     Clear to auscultation bilaterally, respirations unlabored  Heart:    Normal heart rate. Normal rhythm. No murmurs, rubs, or gallops.    MS:   All extremities are intact.    Neurologic:   Awake, alert, oriented x 3. No apparent focal neurological defect.         Most recent functional status assessment:    05/08/2022   10:10 AM  In your present state of health, do you have any difficulty performing the following activities:  Hearing? 0  Vision? 0  Difficulty concentrating or making decisions? 0  Walking or climbing stairs? 0  Dressing or bathing? 0  Doing errands, shopping? 0   Most recent fall risk assessment:    05/08/2022   10:09 AM  Fall Risk   Falls in the past year? 1  Number falls in past yr: 0  Injury with Fall? 0    Most recent depression screenings:    05/08/2022   10:09 AM 10/29/2021    9:41 AM  PHQ 2/9 Scores  PHQ - 2 Score 3 6  PHQ- 9 Score 6 10   Most recent cognitive screening:    05/08/2020   10:03 AM  6CIT Screen  What Year? 0 points  What month? 0 points  What time? 0 points  Count back from 20 0 points  Months in reverse 0 points  Repeat phrase 0 points  Total Score 0 points   Most recent Audit-C alcohol use screening    05/08/2022   10:10 AM  Alcohol Use Disorder Test (AUDIT)  1. How often do you have a drink containing alcohol? 0  2. How many drinks containing alcohol do you have on a typical day when you are drinking? 0  3. How often do you have six or more drinks on one occasion? 0  AUDIT-C Score 0   A score of 3 or more in women, and 4 or more in men indicates increased risk for alcohol abuse, EXCEPT if all of the points are from question 1   No results found for any visits  on 05/08/22.  Assessment & Plan     Annual wellness visit done today including the all of the following: Reviewed patient's Family Medical History Reviewed and updated list of patient's medical providers Assessment of cognitive impairment was done Assessed patient's functional ability Established a written schedule for health screening [place] Completed and Reviewed  Exercise Activities and Dietary recommendations  Goals      Cut out extra servings     Recommend to cut out all junk food when snacking and replace with fruit and vegetables.      Increase  water intake     Recommend increasing water intake to 3 glasses a day.     LIFESTYLE - DECREASE FALLS RISK     Recommend to remove any items from the home that may cause slips or trips.        Immunization History  Administered Date(s) Administered   Influenza Split 08/03/2012   Influenza, High Dose Seasonal PF 09/30/2020, 09/06/2021   Influenza,inj,Quad PF,6+ Mos 08/23/2013, 09/05/2014, 09/07/2015, 09/03/2018   Influenza-Unspecified 09/23/2017, 08/26/2019   PFIZER(Purple Top)SARS-COV-2 Vaccination 01/02/2020, 02/01/2020   PNEUMOCOCCAL CONJUGATE-20 09/06/2021   Pneumococcal Conjugate-13 09/30/2020   Tdap 09/07/2015    Health Maintenance  Topic Date Due   Zoster Vaccines- Shingrix (1 of 2) Never done   DEXA SCAN  04/05/2011   MAMMOGRAM  02/21/2020   COVID-19 Vaccine (3 - Pfizer series) 03/28/2020   INFLUENZA VACCINE  06/18/2022   COLONOSCOPY (Pts 45-88yr Insurance coverage will need to be confirmed)  08/17/2025   TETANUS/TDAP  09/06/2025   Pneumonia Vaccine 68 Years old  Completed   Hepatitis C Screening  Completed   HPV VACCINES  Aged Out     Discussed health benefits of physical activity, and encouraged her to engage in regular exercise appropriate for her age and condition.   2. Breast cancer screening by mammogram  - MM 3D Screening Breast Bilateral - [place]  Future  3. Estrogen deficiency  - DG Bone density [facility]; Future  4. Pure hypercholesterolemia She is tolerating pravastatin well with no adverse effects.   - CBC - Comprehensive metabolic panel - Lipid panel - T[facility]  5. Chronic low back pain with right-sided sciatica, unspecified back pain laterality Progressive secondary to multilevel DDD and lumbar facet disease managed on hydrocodone/apap. She is interested in evaluation to see if surgical removal of excess abdominal panus would help with her back. She is not interested in weight loss medication at this time. Advised we could set referral to plastic surgeon for evaluation if shelikes.   Future Appointments  Date Time Provider [facility] 11/08/2022 10:40 AM FCaryn SectionDKirstie Peri MD [facility]       The entirety of the information documented in the History of Present Illness, Review of Systems and Physical Exam were personally obtained by me. Portions of this information were initially documented by the CMA and reviewed by me for thoroughness and accuracy.     DLelon Huh MD  [phone](phone) [phone](fax)  [place]

## 2022-05-08 ENCOUNTER — Encounter: Payer: Self-pay | Admitting: Family Medicine

## 2022-05-08 ENCOUNTER — Ambulatory Visit (INDEPENDENT_AMBULATORY_CARE_PROVIDER_SITE_OTHER): Payer: Medicare Other | Admitting: Family Medicine

## 2022-05-08 ENCOUNTER — Telehealth: Payer: Self-pay | Admitting: Family Medicine

## 2022-05-08 VITALS — BP 109/70 | HR 65 | Temp 98.5°F | Resp 16 | Ht 65.0 in | Wt 214.8 lb

## 2022-05-08 DIAGNOSIS — M5441 Lumbago with sciatica, right side: Secondary | ICD-10-CM

## 2022-05-08 DIAGNOSIS — E7800 Pure hypercholesterolemia, unspecified: Secondary | ICD-10-CM | POA: Diagnosis not present

## 2022-05-08 DIAGNOSIS — Z0000 Encounter for general adult medical examination without abnormal findings: Secondary | ICD-10-CM | POA: Diagnosis not present

## 2022-05-08 DIAGNOSIS — E2839 Other primary ovarian failure: Secondary | ICD-10-CM

## 2022-05-08 DIAGNOSIS — G8929 Other chronic pain: Secondary | ICD-10-CM

## 2022-05-08 DIAGNOSIS — Z1231 Encounter for screening mammogram for malignant neoplasm of breast: Secondary | ICD-10-CM

## 2022-05-08 NOTE — Patient Instructions (Addendum)
For mammogram: [facility] [address], [address] in the [facility].  Phone: [phone]

## 2022-05-08 NOTE — Telephone Encounter (Signed)
Patient wants to be able to take her little dog everywhere she goes for emotional support.  She said that she was told to talk to her doctor about this.  She would like to speak to Dr. Caryn Section only about this.

## 2022-05-08 NOTE — Telephone Encounter (Signed)
Did she mentioned this to you today? Because she didn't mention anything to me.

## 2022-05-09 LAB — CBC
Hematocrit: 39.9 % (ref 34.0–46.6)
Hemoglobin: 13.2 g/dL (ref 11.1–15.9)
MCH: 28.7 pg (ref 26.6–33.0)
MCHC: 33.1 g/dL (ref 31.5–35.7)
MCV: 87 fL (ref 79–97)
Platelets: 165 10*3/uL (ref 150–450)
RBC: 4.6 x10E6/uL (ref 3.77–5.28)
RDW: 14.2 % (ref 11.7–15.4)
WBC: 5.5 10*3/uL (ref 3.4–10.8)

## 2022-05-09 LAB — COMPREHENSIVE METABOLIC PANEL
ALT: 10 IU/L (ref 0–32)
AST: 13 IU/L (ref 0–40)
Albumin/Globulin Ratio: 1.8 (ref 1.2–2.2)
Albumin: 4.2 g/dL (ref 3.8–4.8)
Alkaline Phosphatase: 59 IU/L (ref 44–121)
BUN/Creatinine Ratio: 21 (ref 12–28)
BUN: 15 mg/dL (ref 8–27)
Bilirubin Total: 0.2 mg/dL (ref 0.0–1.2)
CO2: 27 mmol/L (ref 20–29)
Calcium: 9.5 mg/dL (ref 8.7–10.3)
Chloride: 104 mmol/L (ref 96–106)
Creatinine, Ser: 0.7 mg/dL (ref 0.57–1.00)
Globulin, Total: 2.3 g/dL (ref 1.5–4.5)
Glucose: 99 mg/dL (ref 70–99)
Potassium: 4.6 mmol/L (ref 3.5–5.2)
Sodium: 145 mmol/L — ABNORMAL HIGH (ref 134–144)
Total Protein: 6.5 g/dL (ref 6.0–8.5)
eGFR: 95 mL/min/{1.73_m2} (ref >59)

## 2022-05-09 LAB — LIPID PANEL
Chol/HDL Ratio: 3.7 ratio (ref 0.0–4.4)
Cholesterol, Total: 196 mg/dL (ref 100–199)
HDL: 53 mg/dL (ref >39)
LDL Chol Calc (NIH): 128 mg/dL — ABNORMAL HIGH (ref 0–99)
Triglycerides: 85 mg/dL (ref 0–149)
VLDL Cholesterol Cal: 15 mg/dL (ref 5–40)

## 2022-05-09 LAB — TSH: TSH: 1.85 u[IU]/mL (ref 0.450–4.500)

## 2022-05-15 ENCOUNTER — Other Ambulatory Visit: Payer: Self-pay | Admitting: Family Medicine

## 2022-05-15 DIAGNOSIS — F419 Anxiety disorder, unspecified: Secondary | ICD-10-CM

## 2022-05-30 ENCOUNTER — Telehealth: Payer: Self-pay

## 2022-05-30 NOTE — Telephone Encounter (Signed)
Copied from [place] [phone]. Topic: General - Other >> May 30, 2022  9:16 AM Chapman Fitch wrote: Reason for CRM: pt received a letter for Alejandra Little duty for August 23rd but is not abe to go due to her back issues/ pt has a form and wanted to know if Dr. Caryn Section needs this or if he can provide a letter like he did before but that permanently excuses her / please advise

## 2022-05-31 ENCOUNTER — Other Ambulatory Visit: Payer: Self-pay | Admitting: Family Medicine

## 2022-05-31 NOTE — Telephone Encounter (Signed)
Requested medication (s) are due for refill today: yes  Requested medication (s) are on the active medication list: yes  Last refill:  04/25/22  Future visit scheduled: yes  Notes to clinic:  Unable to refill per protocol, cannot delegate.      Requested Prescriptions  Pending Prescriptions Disp Refills   HYDROcodone-acetaminophen (NORCO) 7.5-325 MG tablet 120 tablet 0    Sig: Take 1 tablet by mouth 4 (four) times daily as needed for moderate pain.     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

## 2022-05-31 NOTE — Telephone Encounter (Signed)
Medication Refill - Medication: HYDROcodone-acetaminophen (NORCO) 7.5-325 MG tablet   Has the patient contacted their pharmacy? No. (Agent: If no, request that the patient contact the pharmacy for the refill. If patient does not wish to contact the pharmacy document the reason why and proceed with request.) (Agent: If yes, when and what did the pharmacy advise?)  Preferred Pharmacy (with phone number or street name): [id number] [address], [place] - [address]  [place], [place] [address]  Phone:  [phone]  Fax:  [phone]   Has the patient been seen for an appointment in the last year OR does the patient have an upcoming appointment? Yes.    Agent: Please be advised that RX refills may take up to 3 business days. We ask that you follow-up with your pharmacy.

## 2022-06-02 MED ORDER — HYDROCODONE-ACETAMINOPHEN 7.5-325 MG PO TABS: 1.0000 | ORAL_TABLET | Freq: Four times a day (QID) | ORAL | 0 refills | Status: DC | PRN

## 2022-06-05 DIAGNOSIS — H40013 Open angle with borderline findings, low risk, bilateral: Secondary | ICD-10-CM | POA: Diagnosis not present

## 2022-06-05 DIAGNOSIS — H2513 Age-related nuclear cataract, bilateral: Secondary | ICD-10-CM | POA: Diagnosis not present

## 2022-06-05 NOTE — Telephone Encounter (Signed)
Pt is calling back, requesting an update. Pt stated she has to go in one day next week and needs the letter excusing her from [employer] as soon as possible.  Please advise.

## 2022-06-13 ENCOUNTER — Ambulatory Visit: Payer: Medicare Other | Admitting: Physician Assistant

## 2022-06-13 NOTE — Progress Notes (Deleted)
     I,Sha'taria Saphire Barnhart,acting as a Education administrator for [employer], PA-C.,have documented all relevant documentation on the behalf of Mikey Kirschner, PA-C,as directed by  Mikey Kirschner, PA-C while in the presence of Mikey Kirschner, PA-C.   Established patient visit   Patient: Alejandra Little   DOB: 16-Aug-1956   66 y.o. Female  MRN: [id number] Visit Date: 06/13/2022  Today's healthcare provider: Mikey Kirschner, PA-C   No chief complaint on file.  Subjective    HPI  Patient is in today to have jury duty forms completed. Patients AWV was done 05/08/22  Medications: Outpatient Medications Prior to Visit  Medication Sig   ALPRAZolam (XANAX) 0.5 MG tablet TAKE 1-2 TABLETS (0.5-1 MG TOTAL) BY MOUTH AT BEDTIME.   esomeprazole (NEXIUM) 40 MG capsule TAKE 1 CAPSULE BY MOUTH EVERY DAY   FLUoxetine (PROZAC) 20 MG capsule TAKE 3 CAPSULES BY MOUTH EVERY DAY   gabapentin (NEURONTIN) 300 MG capsule TAKE 1 CAPSULE ('300MG'$ ) BY MOUTH THREE TIMES DAILY AND TAKE 2 CAPSULES ('600MG'$ ) BY MOUTH AT BEDTIME   HYDROcodone-acetaminophen (NORCO) 7.5-325 MG tablet Take 1 tablet by mouth 4 (four) times daily as needed for moderate pain.   meclizine (ANTIVERT) 25 MG tablet Take 1 tablet (25 mg total) by mouth 3 (three) times daily as needed for dizziness.   pravastatin (PRAVACHOL) 40 MG tablet TAKE 1 TABLET BY MOUTH EVERYDAY AT BEDTIME   Specialty Vitamins Products ([employer] HAIR/SKIN/NAILS) TABS Take 1 tablet by mouth daily.    No facility-administered medications prior to visit.    Review of Systems  {Labs  Heme  Chem  Endocrine  Serology  Results Review (optional):23779}   Objective    There were no vitals taken for this visit. {Show previous vital signs (optional):23777}  Physical Exam  ***  No results found for any visits on 06/13/22.  Assessment & Plan     ***  No follow-ups on file.      {provider attestation***:1}   Mikey Kirschner, PA-C  [facility] [phone]  (phone) [phone] (fax)  [place]

## 2022-06-28 ENCOUNTER — Ambulatory Visit: Payer: Medicare Other | Admitting: Family Medicine

## 2022-06-28 NOTE — Progress Notes (Deleted)
      Established patient visit   Patient: Alejandra Little   DOB: 1956/02/24   66 y.o. Female  MRN: [id number] Visit Date: 06/28/2022  Today's healthcare provider: Lelon Huh, MD   No chief complaint on file.  Subjective    HPI  Patient is here to discuss jury duty.   Medications: Outpatient Medications Prior to Visit  Medication Sig   ALPRAZolam (XANAX) 0.5 MG tablet TAKE 1-2 TABLETS (0.5-1 MG TOTAL) BY MOUTH AT BEDTIME.   esomeprazole (NEXIUM) 40 MG capsule TAKE 1 CAPSULE BY MOUTH EVERY DAY   FLUoxetine (PROZAC) 20 MG capsule TAKE 3 CAPSULES BY MOUTH EVERY DAY   gabapentin (NEURONTIN) 300 MG capsule TAKE 1 CAPSULE ('300MG'$ ) BY MOUTH THREE TIMES DAILY AND TAKE 2 CAPSULES ('600MG'$ ) BY MOUTH AT BEDTIME   HYDROcodone-acetaminophen (NORCO) 7.5-325 MG tablet Take 1 tablet by mouth 4 (four) times daily as needed for moderate pain.   meclizine (ANTIVERT) 25 MG tablet Take 1 tablet (25 mg total) by mouth 3 (three) times daily as needed for dizziness.   pravastatin (PRAVACHOL) 40 MG tablet TAKE 1 TABLET BY MOUTH EVERYDAY AT BEDTIME   Specialty Vitamins Products ([employer]S HAIR/SKIN/NAILS) TABS Take 1 tablet by mouth daily.    No facility-administered medications prior to visit.    Review of Systems  {Labs  Heme  Chem  Endocrine  Serology  Results Review (optional):23779}   Objective    There were no vitals taken for this visit. {Show previous vital signs (optional):23777}  Physical Exam  ***  No results found for any visits on 06/28/22.  Assessment & Plan     ***  No follow-ups on file.      {provider attestation***:1}   Lelon Huh, MD  [facility] [phone] (phone) [phone] (fax)  [place]

## 2022-07-04 ENCOUNTER — Other Ambulatory Visit: Payer: Self-pay | Admitting: Family Medicine

## 2022-07-04 NOTE — Telephone Encounter (Signed)
Medication Refill - Medication: HYDROcodone-acetaminophen (NORCO) 7.5-325 MG tablet   Has the patient contacted their pharmacy? Yes.   (Agent: If no, request that the patient contact the pharmacy for the refill. If patient does not wish to contact the pharmacy document the reason why and proceed with request.) (Agent: If yes, when and what did the pharmacy advise?)  Preferred Pharmacy (with phone number or street name):  [id number] [address], [place] - [address]  [place] [address]  Phone: [phone] Fax: [phone]   Has the patient been seen for an appointment in the last year OR does the patient have an upcoming appointment? Yes.    Agent: Please be advised that RX refills may take up to 3 business days. We ask that you follow-up with your pharmacy.

## 2022-07-05 NOTE — Telephone Encounter (Signed)
Requested medication (s) are due for refill today: yes  Requested medication (s) are on the active medication list: yes  Last refill:  06/02/22 #120/0  Future visit scheduled: yes  Notes to clinic:  Unable to refill per protocol, cannot delegate.     Requested Prescriptions  Pending Prescriptions Disp Refills   HYDROcodone-acetaminophen (NORCO) 7.5-325 MG tablet 120 tablet 0    Sig: Take 1 tablet by mouth 4 (four) times daily as needed for moderate pain.     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

## 2022-07-07 MED ORDER — HYDROCODONE-ACETAMINOPHEN 7.5-325 MG PO TABS: 1.0000 | ORAL_TABLET | Freq: Four times a day (QID) | ORAL | 0 refills | Status: DC | PRN

## 2022-07-08 ENCOUNTER — Telehealth: Payer: Self-pay | Admitting: Family Medicine

## 2022-07-08 ENCOUNTER — Other Ambulatory Visit: Payer: Self-pay

## 2022-07-08 DIAGNOSIS — K219 Gastro-esophageal reflux disease without esophagitis: Secondary | ICD-10-CM

## 2022-07-08 MED ORDER — ESOMEPRAZOLE MAGNESIUM 40 MG PO CPDR: DELAYED_RELEASE_CAPSULE | ORAL | 4 refills | Status: DC

## 2022-07-08 NOTE — Telephone Encounter (Signed)
[facility] faxed refill request for the following medications:   esomeprazole (NEXIUM) 40 MG capsule    Please advise.  

## 2022-07-08 NOTE — Telephone Encounter (Signed)
Refill sent to pharmacy.   

## 2022-07-09 ENCOUNTER — Ambulatory Visit: Payer: Self-pay

## 2022-07-09 NOTE — Patient Outreach (Signed)
Care Coordination   Initial Visit Note   1/88/4166 Name: Alejandra Little MRN: [id number] DOB: [birth year]  Alejandra Little is a 66 y.o. year old female who sees Fisher, Kirstie Peri, MD for primary care. I spoke with  Alejandra Little by phone today  What matters to the patients health and wellness today?  The patient is having an acute flare up of her back pain and is asking for recommendations     Goals Addressed             This Visit's Progress    RNCM: Effective Management of Pain       Care Coordination Interventions: 07-09-2022: The patient is rating her back pain at a 10 when she gets up. Having spasms in her back.  Reviewed provider established plan for pain management. 07-09-2022: The patient has pain medications but states that the pain meds are not helping her. She thinks she may have done this last week when she was weed eating. She says this generally happens 2 times a year. 07-09-2022 at 238 pm: Call made back to the patient and offered the patient assistance in calling the office to get an appointment with Dr. Caryn Little. The patient states that she is going to "wait it out" and see if it gets better. Education and support given. The number to the office is [phone]. Discussed importance of adherence to all scheduled medical appointments. Saw the pcp on 05-08-2022. A message has been sent to the pcp for recommendations Counseled on the importance of reporting any/all new or changed pain symptoms or management strategies to pain management provider Advised patient to report to care team affect of pain on daily activities. 07-09-2022: The patient states she has not been able to do any of her usual activities due to the pain in her lower back.  Discussed use of relaxation techniques and/or diversional activities to assist with pain reduction (distraction, imagery, relaxation, massage, acupressure, TENS, heat, and cold application. 07-09-2022: The patient states the only relief she has had was  standing in the shower and allowing the hottest water she can to hit her back where it hurts. Education on using a heating pad with 20 minutes of heat on and 20 minutes of heat off. The patient is going to try this. Education on making sure that she does not burn herself and be mindful of the area that she is applying the heat to.  Reviewed with patient prescribed pharmacological and nonpharmacological pain relief strategies. 07-09-2022: The patient has pain medications but it is not helping. The RNCM has reached out to the pcp for recommendations with patients expressed needs.  Collaboration with the pcp, Dr. Caryn Little concerning the patient having an acute exacerbation of her back pain and discomfort. She is asking for recommendations. Secure chat with Dr. Caryn Little for recommendations for the patient.  Advised patient to discuss unresolved pain, changes in level and intensity of pain, plan of care for effective pain management with provider Screening for signs and symptoms of depression related to chronic disease state  Assessed social determinant of health barriers AWV completed on 05-08-2022           SDOH assessments and interventions completed:  Yes  SDOH Interventions Today    Flowsheet Row Most Recent Value  SDOH Interventions   Food Insecurity Interventions Intervention Not Indicated  Financial Strain Interventions Intervention Not Indicated  Housing Interventions Intervention Not Indicated  Stress Interventions Intervention Not Indicated  Social Connections Interventions Intervention Not Indicated,  Other (Comment)  [the patient has a good support system with her family]  Transportation Interventions Intervention Not Indicated        Care Coordination Interventions Activated:  Yes  Care Coordination Interventions:  Yes, provided   Follow up plan: Follow up call scheduled for 09-11-2022 at 0930 am    Encounter Outcome:  Pt. Visit Completed   Alejandra Larsson RN, MSN, [facility] Mobile: [phone]

## 2022-07-09 NOTE — Patient Instructions (Signed)
Visit Information  Thank you for taking time to visit with me today. Please don't hesitate to contact me if I can be of assistance to you.   Following are the goals we discussed today:   Goals Addressed             This Visit's Progress    RNCM: Effective Management of Pain       Care Coordination Interventions: 07-09-2022: The patient is rating her back pain at a 10 when she gets up. Having spasms in her back.  Reviewed provider established plan for pain management. 07-09-2022: The patient has pain medications but states that the pain meds are not helping her. She thinks she may have done this last week when she was weed eating. She says this generally happens 2 times a year. 07-09-2022 at 238 pm: Call made back to the patient and offered the patient assistance in calling the office to get an appointment with Dr. Caryn Section. The patient states that she is going to "wait it out" and see if it gets better. Education and support given. The number to the office is [phone]. Discussed importance of adherence to all scheduled medical appointments. Saw the pcp on 05-08-2022. A message has been sent to the pcp for recommendations Counseled on the importance of reporting any/all new or changed pain symptoms or management strategies to pain management provider Advised patient to report to care team affect of pain on daily activities. 07-09-2022: The patient states she has not been able to do any of her usual activities due to the pain in her lower back.  Discussed use of relaxation techniques and/or diversional activities to assist with pain reduction (distraction, imagery, relaxation, massage, acupressure, TENS, heat, and cold application. 07-09-2022: The patient states the only relief she has had was standing in the shower and allowing the hottest water she can to hit her back where it hurts. Education on using a heating pad with 20 minutes of heat on and 20 minutes of heat off. The patient is going to try  this. Education on making sure that she does not burn herself and be mindful of the area that she is applying the heat to.  Reviewed with patient prescribed pharmacological and nonpharmacological pain relief strategies. 07-09-2022: The patient has pain medications but it is not helping. The RNCM has reached out to the pcp for recommendations with patients expressed needs.  Collaboration with the pcp, Dr. Caryn Section concerning the patient having an acute exacerbation of her back pain and discomfort. She is asking for recommendations. Secure chat with Dr. Caryn Section for recommendations for the patient.  Advised patient to discuss unresolved pain, changes in level and intensity of pain, plan of care for effective pain management with provider Screening for signs and symptoms of depression related to chronic disease state  Assessed social determinant of health barriers AWV completed on 05-08-2022           Our next appointment is by telephone on 09-11-2022 at 0930 am  Please call the care guide team at [phone] if you need to cancel or reschedule your appointment.   If you are experiencing a Mental Health or [place] or need someone to talk to, please call the Suicide and Crisis Lifeline: 988 call the [place] National Suicide Prevention Lifeline: 585-356-1902 or TTY: 417-117-0574 TTY 916-751-3433) to talk to a trained counselor call 1-800-273-TALK (toll free, 24 hour hotline)  Patient verbalizes understanding of instructions and care plan provided today and agrees to view in  MyChart. Active MyChart status and patient understanding of how to access instructions and care plan via MyChart confirmed with patient.     Telephone follow up appointment with care management team member scheduled for: 09-11-2022 at 0930 am   Noreene Larsson RN, MSN, [facility] Mobile: [phone]    Heat Therapy Heat therapy can help ease sore, stiff,  injured, and tight muscles and joints. Heat relaxes your muscles. This may help ease your pain and muscle spasms. What are the risks? If you have any of the following conditions, do not use heat therapy unless your doctor says it is okay. These conditions include: New bruises. Open wounds. Any of these in the area being treated: Healing wounds. Infected skin. Scarred skin. Problems with how blood moves through your body (circulation). Loss of feeling (numbness) in the part of your body that is being treated. Unusual swelling of the part of the body that is being treated. Blood clots. Diabetes. Heart disease. Cancer. Not being able to communicate pain. This may include young children and people who have problems with their brain function (dementia). How to use heat therapy There are different kinds of heat therapy. These include: Moist heat pack. Hot water bottle. Electric heating pad. Heated gel pack. Heated wrap. Warm water bath. Your doctor will tell you how to use heat therapy. In general, you should: Place a towel between your skin and the heat source. Leave the heat on for 20-30 minutes. Your skin may turn pink. Take off the heat if your skin turns bright red. This is very important. If you cannot feel pain, heat, or cold, you have a greater risk of getting burned. Your doctor may also tell you to take a warm water bath. To do this: Put a non-slip pad in the bathtub to prevent a fall. Fill the bathtub with warm water. Check the water temperature. Soak in the water for 15-20 minutes, or as told by your doctor. Be careful when you stand up after the bath. You may feel dizzy. Pat yourself dry after the bath. Do not rub your skin to dry it. General recommendations for heat therapy Be careful not to burn your skin when using heat therapy. High heat or using heat for a long time can cause burns. Do not sleep while using heat therapy. Only use heat therapy while you are  awake. Check your skin during heat therapy. Do not use heat therapy if you have a new injury, especially if you have swelling on the injured area. Do not use heat therapy on areas of your skin that are already irritated, such as with a rash or sunburn. Do not use heat therapy if your skin turns bright red. Contact a doctor if: You have blisters, redness, swelling, or loss of feeling in the area where you use heat therapy. You have new pain. You have pain that gets worse. Summary Heat therapy is the use of heat to help ease sore, stiff, injured, and tight muscles and joints. There are different types of heat therapy. Your doctor will tell you which one to use. Only use heat therapy while you are awake. Watch your skin to make sure you do not get burned while using heat therapy. This information is not intended to replace advice given to you by your health care provider. Make sure you discuss any questions you have with your health care provider. Document Revised: 09/06/2020 Document Reviewed: 09/06/2020 Elsevier Patient Education  2023  [employer].

## 2022-07-16 ENCOUNTER — Other Ambulatory Visit: Payer: Self-pay | Admitting: Family Medicine

## 2022-07-16 DIAGNOSIS — F419 Anxiety disorder, unspecified: Secondary | ICD-10-CM

## 2022-07-18 ENCOUNTER — Ambulatory Visit
Admission: RE | Admit: 2022-07-18 | Discharge: 2022-07-18 | Disposition: A | Discharge status: Home / Self Care | Payer: Medicare Other | Source: Ambulatory Visit | Attending: Family Medicine | Admitting: Family Medicine

## 2022-07-18 DIAGNOSIS — Z780 Asymptomatic menopausal state: Secondary | ICD-10-CM | POA: Diagnosis not present

## 2022-07-18 DIAGNOSIS — M85851 Other specified disorders of bone density and structure, right thigh: Secondary | ICD-10-CM | POA: Diagnosis not present

## 2022-07-18 DIAGNOSIS — E2839 Other primary ovarian failure: Secondary | ICD-10-CM | POA: Diagnosis not present

## 2022-07-18 DIAGNOSIS — Z1231 Encounter for screening mammogram for malignant neoplasm of breast: Secondary | ICD-10-CM | POA: Diagnosis not present

## 2022-08-08 ENCOUNTER — Other Ambulatory Visit: Payer: Self-pay | Admitting: Family Medicine

## 2022-08-08 NOTE — Telephone Encounter (Signed)
Copied from [place] [id number]. Topic: General - Other >> Aug 08, 2022  3:54 PM Everette C wrote: Reason for CRM: Medication Refill - Medication: HYDROcodone-acetaminophen ([place]) 7.5-325 MG tablet [[id number]]   Has the patient contacted their pharmacy? Yes.   (Agent: If no, request that the patient contact the pharmacy for the refill. If patient does not wish to contact the pharmacy document the reason why and proceed with request.) (Agent: If yes, when and what did the pharmacy advise?)  Preferred Pharmacy (with phone number or street name): [id number] [address], [place] [address] Phone: [phone] Fax: [phone] Hours: Not open 24 hours   Has the patient been seen for an appointment in the last year OR does the patient have an upcoming appointment? Yes.    Agent: Please be advised that RX refills may take up to 3 business days. We ask that you follow-up with your pharmacy.

## 2022-08-08 NOTE — Telephone Encounter (Signed)
Requested medication (s) are due for refill today: yes  Requested medication (s) are on the active medication list: yes  Last refill:  07/07/22 #120/0  Future visit scheduled: yes  Notes to clinic:  Unable to refill per protocol, cannot delegate.      Requested Prescriptions  Pending Prescriptions Disp Refills   HYDROcodone-acetaminophen (NORCO) 7.5-325 MG tablet 120 tablet 0    Sig: Take 1 tablet by mouth 4 (four) times daily as needed for moderate pain.     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

## 2022-08-09 ENCOUNTER — Other Ambulatory Visit: Payer: Self-pay | Admitting: Family Medicine

## 2022-08-09 DIAGNOSIS — Z23 Encounter for immunization: Secondary | ICD-10-CM | POA: Diagnosis not present

## 2022-08-09 DIAGNOSIS — G8929 Other chronic pain: Secondary | ICD-10-CM

## 2022-08-09 MED ORDER — HYDROCODONE-ACETAMINOPHEN 7.5-325 MG PO TABS: 1.0000 | ORAL_TABLET | Freq: Four times a day (QID) | ORAL | 0 refills | Status: DC | PRN

## 2022-08-09 NOTE — Telephone Encounter (Signed)
Requested medications are due for refill today.  Unsure  Requested medications are on the active medications list.  yes  Last refill. 03/05/2021 #450 0 refills  Future visit scheduled.   yes  Notes to clinic. Please review for refill. Mediation has not been ordered for over 1 year.    Requested Prescriptions  Pending Prescriptions Disp Refills   gabapentin (NEURONTIN) 300 MG capsule [Pharmacy Med Name: GABAPENTIN 300 MG CAPSULE] 450 capsule 0    Sig: TAKE 1 CAPSULE ('300MG'$ ) BY MOUTH THREE TIMES DAILY AND TAKE 2 CAPSULES ('600MG'$ ) BY MOUTH AT BEDTIME     Neurology: Anticonvulsants - gabapentin Passed - 08/09/2022  5:52 PM      Passed - Cr in normal range and within 360 days    Creatinine  Date Value Ref Range Status  11/30/2012 0.75 0.60 - 1.30 mg/dL Final   Creatinine, Ser  Date Value Ref Range Status  05/08/2022 0.70 0.57 - 1.00 mg/dL Final         Passed - Completed PHQ-2 or PHQ-9 in the last 360 days      Passed - Valid encounter within last 12 months    Recent Outpatient Visits           3 months ago Medicare annual wellness visit, subsequent   [facility] [name], MD   9 months ago 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

## 2022-08-14 ENCOUNTER — Other Ambulatory Visit: Payer: Self-pay | Admitting: Family Medicine

## 2022-08-14 DIAGNOSIS — G8929 Other chronic pain: Secondary | ICD-10-CM

## 2022-08-14 NOTE — Telephone Encounter (Signed)
Reordered 08/10/22 #450 sent to requesting pharmacy  Requested Prescriptions  Refused Prescriptions Disp Refills  . gabapentin (NEURONTIN) 300 MG capsule [Pharmacy Med Name: GABAPENTIN 300 MG CAPSULE] 450 capsule 4    Sig: TAKE 1 CAPSULE ('300MG'$ ) BY MOUTH THREE TIMES DAILY AND TAKE 2 CAPSULES ('600MG'$ ) BY MOUTH AT BEDTIME     Neurology: Anticonvulsants - gabapentin Passed - 08/14/2022 11:13 AM      Passed - Cr in normal range and within 360 days    Creatinine  Date Value Ref Range Status  11/30/2012 0.75 0.60 - 1.30 mg/dL Final   Creatinine, Ser  Date Value Ref Range Status  05/08/2022 0.70 0.57 - 1.00 mg/dL Final         Passed - Completed PHQ-2 or PHQ-9 in the last 360 days      Passed - Valid encounter within last 12 months    Recent Outpatient Visits          3 months ago Medicare annual wellness visit, subsequent   [facility] [name], MD   9 months ago 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

## 2022-09-09 ENCOUNTER — Other Ambulatory Visit: Payer: Self-pay | Admitting: Family Medicine

## 2022-09-09 NOTE — Telephone Encounter (Signed)
Medication Refill - Medication: HYDROcodone-acetaminophen (NORCO) 7.5-325 MG tablet  Has the patient contacted their pharmacy? No. (Agent: If no, request that the patient contact the pharmacy for the refill. If patient does not wish to contact the pharmacy document the reason why and proceed with request.) (Agent: If yes, when and what did the pharmacy advise?)  Preferred Pharmacy (with phone number or street name): [id number] [address], [place]  [place], [place] [address]  Phone:  [phone]  Fax:  [phone]  Has the patient been seen for an appointment in the last year OR does the patient have an upcoming appointment? Yes.    Agent: Please be advised that RX refills may take up to 3 business days. We ask that you follow-up with your pharmacy.

## 2022-09-10 ENCOUNTER — Telehealth: Payer: Self-pay | Admitting: Family Medicine

## 2022-09-10 MED ORDER — HYDROCODONE-ACETAMINOPHEN 7.5-325 MG PO TABS: 1.0000 | ORAL_TABLET | Freq: Four times a day (QID) | ORAL | 0 refills | Status: DC | PRN

## 2022-09-10 NOTE — Telephone Encounter (Signed)
[facility] called, long hold time. Patient called, left VM to return the call to the office. [facility] a 3 month supply with 4 refills, advise [facility] sent [facility]10[facility]22 and if it was not received to call us back to resend.

## 2022-09-10 NOTE — Telephone Encounter (Signed)
Requested medication (s) are due for refill today - yes  Requested medication (s) are on the active medication list -yes  Future visit scheduled -yes  Last refill: 08/09/22 #120  Notes to clinic: non delegated Rx- may need to verify pharmacy requested at different location  Requested Prescriptions  Pending Prescriptions Disp Refills   HYDROcodone-acetaminophen (NORCO) 7.5-325 MG tablet 120 tablet 0    Sig: Take 1 tablet by mouth 4 (four) times daily as needed for moderate pain.     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   [facility] [name], MD   1 year ago Pure hypercholesterolemia   [facility] [name], MD   1 year ago 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

## 2022-09-10 NOTE — Telephone Encounter (Signed)
Medication Refill - Medication: gabapentin (NEURONTIN) 300 MG capsule  Has the patient contacted their pharmacy? Yes.   (Agent: If no, request that the patient contact the pharmacy for the refill. If patient does not wish to contact the pharmacy document the reason why and proceed with request.) (Agent: If yes, when and what did the pharmacy advise?)  Preferred Pharmacy (with phone number or street name[id number] [place] [place] [id number] Phone: [phone]: [phone]  Has the patient been seen for an appointment in the last year OR does the patient have an upcoming appointment? Yes.    Agent: Please be advised that RX refills may take up to 3 business days. We ask that you follow-up with your pharmacy.

## 2022-09-11 ENCOUNTER — Other Ambulatory Visit: Payer: Self-pay | Admitting: Family Medicine

## 2022-09-11 ENCOUNTER — Ambulatory Visit: Payer: Self-pay

## 2022-09-11 NOTE — Patient Outreach (Unsigned)
  Care Coordination   Follow Up Visit Note   [id number] Name: CATHLIN BUCHAN MRN: [id number] DOB: [birth year]  ADILYNNE FITZWATER is a 66 y.o. year old female who sees Fisher, Kirstie Peri, MD for primary care. I spoke with  [name] by phone today.  What matters to the patients health and wellness today?      SDOH assessments and interventions completed:  No{THN Tip this will not be part of the note when signed-REQUIRED REPORT FIELD DO NOT DELETE (Optional):27901}     Care Coordination Interventions Activated:  Yes {THN Tip this will not be part of the note when signed-REQUIRED REPORT FIELD DO NOT DELETE (Optional):27901} Care Coordination Interventions:  Yes, provided {THN Tip this will not be part of the note when signed-REQUIRED REPORT FIELD DO NOT DELETE (Optional):27901}  Follow up plan: {CCFOLLOWUP:27768}   Encounter Outcome:  {ENCOUTCOME:27770} {THN Tip this will not be part of the note when signed-REQUIRED REPORT FIELD DO NOT DELETE (Optional):27901}

## 2022-09-11 NOTE — Telephone Encounter (Signed)
[facility] ([place]) 7.5-325 MG tablet and requests that it be sent to  [place], [place] Phone:  [phone]  Fax:  [phone]

## 2022-09-19 ENCOUNTER — Other Ambulatory Visit: Payer: Self-pay | Admitting: Family Medicine

## 2022-09-19 DIAGNOSIS — F419 Anxiety disorder, unspecified: Secondary | ICD-10-CM

## 2022-09-19 NOTE — Telephone Encounter (Signed)
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

## 2022-09-20 ENCOUNTER — Other Ambulatory Visit: Payer: Self-pay | Admitting: Family Medicine

## 2022-09-20 DIAGNOSIS — G8929 Other chronic pain: Secondary | ICD-10-CM

## 2022-09-20 NOTE — Telephone Encounter (Signed)
Requested Prescriptions  Pending Prescriptions Disp Refills   gabapentin (NEURONTIN) 300 MG capsule [Pharmacy Med Name: GABAPENTIN 300 MG CAPSULE] 450 capsule 4    Sig: TAKE 1 CAPSULE ('300MG'$ ) BY MOUTH THREE TIMES DAILY AND TAKE 2 CAPSULES ('600MG'$ ) BY MOUTH AT BEDTIME     Neurology: Anticonvulsants - gabapentin Passed - 09/20/2022 11:21 AM      Passed - Cr in normal range and within 360 days    Creatinine  Date Value Ref Range Status  11/30/2012 0.75 0.60 - 1.30 mg/dL Final   Creatinine, Ser  Date Value Ref Range Status  05/08/2022 0.70 0.57 - 1.00 mg/dL Final         Passed - Completed PHQ-2 or PHQ-9 in the last 360 days      Passed - Valid encounter within last 12 months    Recent Outpatient Visits           4 months ago Medicare annual wellness visit, subsequent   [facility] [name], MD   10 months ago 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

## 2022-09-20 NOTE — Telephone Encounter (Signed)
This transmission has failed twice, perhaps the number of capsules is not allowed. Please reassess as pt is requesting but it will not e-prescribe.

## 2022-09-24 ENCOUNTER — Other Ambulatory Visit: Payer: Self-pay | Admitting: Family Medicine

## 2022-09-24 DIAGNOSIS — G8929 Other chronic pain: Secondary | ICD-10-CM

## 2022-10-04 DIAGNOSIS — Z96642 Presence of left artificial hip joint: Secondary | ICD-10-CM | POA: Diagnosis not present

## 2022-10-04 DIAGNOSIS — M1711 Unilateral primary osteoarthritis, right knee: Secondary | ICD-10-CM | POA: Diagnosis not present

## 2022-10-07 ENCOUNTER — Telehealth: Payer: Self-pay | Admitting: Family Medicine

## 2022-10-07 DIAGNOSIS — K219 Gastro-esophageal reflux disease without esophagitis: Secondary | ICD-10-CM

## 2022-10-07 MED ORDER — ESOMEPRAZOLE MAGNESIUM 40 MG PO CPDR: DELAYED_RELEASE_CAPSULE | ORAL | 4 refills | Status: DC

## 2022-10-07 NOTE — Telephone Encounter (Signed)
[facility] faxed refill request for the following medications:   esomeprazole (NEXIUM) 40 MG capsule    Please advise.  

## 2022-10-18 ENCOUNTER — Other Ambulatory Visit: Payer: Self-pay | Admitting: Family Medicine

## 2022-10-18 DIAGNOSIS — G8929 Other chronic pain: Secondary | ICD-10-CM

## 2022-10-18 MED ORDER — HYDROCODONE-ACETAMINOPHEN 7.5-325 MG PO TABS: 1.0000 | ORAL_TABLET | Freq: Four times a day (QID) | ORAL | 0 refills | Status: DC | PRN

## 2022-10-18 MED ORDER — GABAPENTIN 300 MG PO CAPS: ORAL_CAPSULE | ORAL | 4 refills | Status: DC

## 2022-10-18 NOTE — Telephone Encounter (Signed)
Requested medication (s) are due for refill today:Yes  Requested medication (s) are on the active medication list: Yes  Last refill:  09/10/22  Future visit scheduled: Yes  Notes to clinic:  Unable to refill per protocol, cannot delegate.      Requested Prescriptions  Pending Prescriptions Disp Refills   HYDROcodone-acetaminophen (NORCO) 7.5-325 MG tablet 120 tablet 0    Sig: Take 1 tablet by mouth 4 (four) times daily as needed for moderate pain.     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   [facility] [name], MD   1 year ago Pure hypercholesterolemia   [facility] [name], MD   1 year ago 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   [facility] [name], MD   2 years ago [place], [name]       Future Appointments             In 1 month Fisher, Kirstie Peri, MD [facility], [facility]            Signed Prescriptions Disp Refills   gabapentin (NEURONTIN) 300 MG capsule 450 capsule 4    Sig: TAKE 1 CAPSULE ('300MG'$ ) BY MOUTH THREE TIMES DAILY AND TAKE 2 CAPSULES ('600MG'$ ) BY MOUTH AT BEDTIME     Neurology: Anticonvulsants - gabapentin Passed - 10/18/2022 12:39 PM      Passed - Cr in normal range and within 360 days    Creatinine  Date Value Ref Range Status  11/30/2012 0.75 0.60 - 1.30 mg/dL Final   Creatinine, Ser  Date Value Ref Range Status  05/08/2022 0.70 0.57 - 1.00 mg/dL Final          Passed - Completed PHQ-2 or PHQ-9 in the last 360 days      Passed - Valid encounter within last 12 months    Recent Outpatient Visits           5 months ago Medicare annual wellness visit, subsequent   [facility] [name], MD   11 months ago 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

## 2022-10-18 NOTE — Telephone Encounter (Signed)
[facility] called and spoke to Alejandra Little, [place] about the refill(s) gabapentin requested. Advised it was sent on 09/20/22 #450/4 refill(s). He says it was never received. Advised it will be resent. After reviewing the 09/20/22 refill encounter, it says transmission failed to pharmacy.

## 2022-10-18 NOTE — Telephone Encounter (Signed)
Medication Refill - Medication: HYDROcodone-acetaminophen (NORCO) 7.5-325 MG tablet   gabapentin (NEURONTIN) 300 MG capsule   Has the patient contacted their pharmacy? Yes.   (Agent: If no, request that the patient contact the pharmacy for the refill. If patient does not wish to contact the pharmacy document the reason why and proceed with request.) (Agent: If yes, when and what did the pharmacy advise?)  Preferred Pharmacy (with phone number or street name):  [id number] [place] [place] [address] [id number] Phone: [phone]: [phone]  Has the patient been seen for an appointment in the last year OR does the patient have an upcoming appointment? Yes.    Agent: Please be advised that RX refills may take up to 3 business days. We ask that you follow-up with your pharmacy.

## 2022-11-07 ENCOUNTER — Other Ambulatory Visit: Payer: Self-pay | Admitting: Physician Assistant

## 2022-11-07 ENCOUNTER — Ambulatory Visit: Payer: Self-pay

## 2022-11-07 DIAGNOSIS — R42 Dizziness and giddiness: Secondary | ICD-10-CM

## 2022-11-07 NOTE — Telephone Encounter (Signed)
Please advise 

## 2022-11-07 NOTE — Telephone Encounter (Signed)
   Chief Complaint: Vertigo, nausea. "Dr. Caryn Section usually calls in meclizine for me."  Symptoms: Above Frequency: This morning Pertinent Negatives: Patient denies  Disposition: '[]'$ ED /'[]'$ Urgent Care (no appt availability in office) / '[]'$ Appointment(In office/virtual)/ '[]'$  [facility]/ '[]'$ Home Care/ '[]'$ Refused Recommended Disposition /'[]'$ [facility]/ '[x]'$  Follow-up with PCP Additional Notes: Please advise pt.   Answer Assessment - Initial Assessment Questions 1. DESCRIPTION: "Describe your dizziness."     Vertigo 2. VERTIGO: "Do you feel like either you or the room is spinning or tilting?"      Yes 3. LIGHTHEADED: "Do you feel lightheaded?" (e.g., somewhat faint, woozy, weak upon standing)     Woozy 4. SEVERITY: "How bad is it?"  "Can you walk?"   - MILD: Feels slightly dizzy and unsteady, but is walking normally.   - MODERATE: Feels unsteady when walking, but not falling; interferes with normal activities (e.g., school, work).   - SEVERE: Unable to walk without falling, or requires assistance to walk without falling.     Moderate 5. ONSET:  "When did the dizziness begin?"     Today 6. AGGRAVATING FACTORS: "Does anything make it worse?" (e.g., standing, change in head position)     Getting up 7. CAUSE: "What do you think is causing the dizziness?"     Vertigo 8. RECURRENT SYMPTOM: "Have you had dizziness before?" If Yes, ask: "When was the last time?" "What happened that time?"     Yes 9. OTHER SYMPTOMS: "Do you have any other symptoms?" (e.g., headache, weakness, numbness, vomiting, earache)     Nausea 10. PREGNANCY: "Is there any chance you are pregnant?" "When was your last menstrual period?"       No  Protocols used: Dizziness - Vertigo-A-AH

## 2022-11-07 NOTE — Telephone Encounter (Signed)
Pt would like something called in for vertigo/ pt has nausea / please advise    Left message to call back abut symptoms.

## 2022-11-08 ENCOUNTER — Ambulatory Visit: Payer: Medicare Other | Admitting: Family Medicine

## 2022-11-08 ENCOUNTER — Ambulatory Visit: Payer: Self-pay | Admitting: *Deleted

## 2022-11-08 DIAGNOSIS — R42 Dizziness and giddiness: Secondary | ICD-10-CM

## 2022-11-08 MED ORDER — MECLIZINE HCL 25 MG PO TABS: 25.0000 mg | ORAL_TABLET | Freq: Three times a day (TID) | ORAL | 0 refills | Status: AC | PRN

## 2022-11-08 MED ORDER — MECLIZINE HCL 25 MG PO TABS: 25.0000 mg | ORAL_TABLET | Freq: Three times a day (TID) | ORAL | 0 refills | Status: DC | PRN

## 2022-11-08 NOTE — Addendum Note (Signed)
Addended by: [name] on: 11/08/2022 03:33 PM   Modules accepted: Orders

## 2022-11-08 NOTE — Addendum Note (Signed)
Addended by: Julieta Bellini on: 11/08/2022 02:35 PM   Modules accepted: Orders

## 2022-11-08 NOTE — Telephone Encounter (Signed)
Summary: nausea med   Patient called back still waiting on response for nausea med, meclizine  She talked ot NT yesterday about this     Patient is reporting her vertigo is bad and she needs the medication - patient advised it is waiting for provider review.  Reason for Disposition  Caller requesting a CONTROLLED substance prescription refill (e.g., narcotics, ADHD medicines)  Answer Assessment - Initial Assessment Questions 1. DRUG NAME: "What medicine do you need to have refilled?"     Meclizine  2. REFILLS REMAINING: "How many refills are remaining?" (Note: The label on the medicine or pill bottle will show how many refills are remaining. If there are no refills remaining, then a renewal may be needed.)     none 3. EXPIRATION DATE: "What is the expiration date?" (Note: The label states when the prescription will expire, and thus can no longer be refilled.)     na 4. PRESCRIBING HCP: "Who prescribed it?" Reason: If prescribed by specialist, call should be referred to that group.     PCP 5. SYMPTOMS: "Do you have any symptoms?"     vertigo  Patient is calling to follow up on request for RF. Advised awaiting provider response. Patient request call back if she needs it.  Protocols used: Medication Refill and Renewal Call-A-AH

## 2022-11-14 DIAGNOSIS — H40013 Open angle with borderline findings, low risk, bilateral: Secondary | ICD-10-CM | POA: Diagnosis not present

## 2022-11-19 ENCOUNTER — Other Ambulatory Visit: Payer: Self-pay | Admitting: Family Medicine

## 2022-11-19 NOTE — Telephone Encounter (Signed)
Medication Refill - Medication: HYDROcodone-acetaminophen (NORCO) 7.5-325 MG tablet [[id number]]    Has the patient contacted their pharmacy? No. (Agent: If no, request that the patient contact the pharmacy for the refill. If patient does not wish to contact the pharmacy document the reason why and proceed with request.) (Agent: If yes, when and what did the pharmacy advise?)  Preferred Pharmacy (with phone number or street name):  [id number] [place], [place]- [address]    Has the patient been seen for an appointment in the last year OR does the patient have an upcoming appointment? Yes.    Agent: Please be advised that RX refills may take up to 3 business days. We ask that you follow-up with your pharmacy.

## 2022-11-20 MED ORDER — HYDROCODONE-ACETAMINOPHEN 7.5-325 MG PO TABS: 1.0000 | ORAL_TABLET | Freq: Four times a day (QID) | ORAL | 0 refills | Status: DC | PRN

## 2022-11-20 NOTE — Telephone Encounter (Signed)
Requested medication (s) are due for refill today yes  Requested medication (s) are on the active medication list -yes  Future visit scheduled -yes  Last refill: 10/18/22 #120  Notes to clinic: non delegated Rx  Requested Prescriptions  Pending Prescriptions Disp Refills   HYDROcodone-acetaminophen (NORCO) 7.5-325 MG tablet 120 tablet 0    Sig: Take 1 tablet by mouth 4 (four) times daily as needed for moderate pain.     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   [facility] [name], MD   1 year ago Pure hypercholesterolemia   [facility] [name], MD   1 year ago 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

## 2022-12-02 ENCOUNTER — Ambulatory Visit (INDEPENDENT_AMBULATORY_CARE_PROVIDER_SITE_OTHER): Payer: Medicare Other | Admitting: Family Medicine

## 2022-12-02 ENCOUNTER — Encounter: Payer: Self-pay | Admitting: Family Medicine

## 2022-12-02 VITALS — BP 126/59 | HR 66 | Wt 208.0 lb

## 2022-12-02 DIAGNOSIS — F17210 Nicotine dependence, cigarettes, uncomplicated: Secondary | ICD-10-CM | POA: Diagnosis not present

## 2022-12-02 DIAGNOSIS — R5383 Other fatigue: Secondary | ICD-10-CM

## 2022-12-02 DIAGNOSIS — G4719 Other hypersomnia: Secondary | ICD-10-CM | POA: Diagnosis not present

## 2022-12-02 DIAGNOSIS — E7800 Pure hypercholesterolemia, unspecified: Secondary | ICD-10-CM

## 2022-12-02 DIAGNOSIS — R200 Anesthesia of skin: Secondary | ICD-10-CM

## 2022-12-02 DIAGNOSIS — F0631 Mood disorder due to known physiological condition with depressive features: Secondary | ICD-10-CM

## 2022-12-02 DIAGNOSIS — R202 Paresthesia of skin: Secondary | ICD-10-CM

## 2022-12-02 DIAGNOSIS — G4733 Obstructive sleep apnea (adult) (pediatric): Secondary | ICD-10-CM | POA: Diagnosis not present

## 2022-12-02 NOTE — Progress Notes (Signed)
I,Sha'taria Tyson,acting as a Education administrator for Lelon Huh, MD.,have documented all relevant documentation on the behalf of Lelon Huh, MD,as directed by  Lelon Huh, MD while in the presence of Lelon Huh, MD.   Established patient visit   Patient: Alejandra Little   DOB: [birth year]   67 y.o. Female  MRN: [id number] Visit Date: 12/02/2022  Today's healthcare provider: Lelon Huh, MD   Chief Complaint  Patient presents with   Hyperlipidemia   Fatigue   Subjective    HPI   Lipid/Cholesterol, Follow-up  Last lipid panel Other pertinent labs  Lab Results  Component Value Date   CHOL 196 05/08/2022   HDL 53 05/08/2022   LDLCALC 128 (H) 05/08/2022   TRIG 85 05/08/2022   CHOLHDL 3.7 05/08/2022   Lab Results  Component Value Date   ALT 10 05/08/2022   AST 13 05/08/2022   PLT 165 05/08/2022   TSH 1.850 05/08/2022     She was last seen for this 6 months ago.  Management since that visit includes continue current treatment.  She reports excellent compliance with treatment. She is not having side effects.   Symptoms: No chest pain No chest pressure/discomfort  No dyspnea No lower extremity edema  Yes numbness or tingling of extremity No orthopnea  No palpitations No paroxysmal nocturnal dyspnea  No speech difficulty No syncope   The 10-year ASCVD risk score (Arnett DK, et al., 2019) is: 9.6%  ---------------------------------------------------------------------------------------------------  She also reports she has had no energy and feeling tired all the time. Has been going on for several months, but much worse since last month. She does she has had some trouble with her CPAP machine and has not used for the last month. Prior to that she had been using it consistently every night.   She also reports numbness and tingling in her fingers for the last month or so.   Medications: Outpatient Medications Prior to Visit  Medication Sig   ALPRAZolam (XANAX) 0.5  MG tablet TAKE 1-2 TABLETS (0.5-1 MG TOTAL) BY MOUTH AT BEDTIME.   esomeprazole (NEXIUM) 40 MG capsule TAKE 1 CAPSULE BY MOUTH EVERY DAY   FLUoxetine (PROZAC) 20 MG capsule TAKE 3 CAPSULES BY MOUTH EVERY DAY   gabapentin (NEURONTIN) 300 MG capsule TAKE 1 CAPSULE ('300MG'$ ) BY MOUTH THREE TIMES DAILY AND TAKE 2 CAPSULES ('600MG'$ ) BY MOUTH AT BEDTIME   HYDROcodone-acetaminophen (NORCO) 7.5-325 MG tablet Take 1 tablet by mouth 4 (four) times daily as needed for moderate pain.   meclizine (ANTIVERT) 25 MG tablet Take 1 tablet (25 mg total) by mouth 3 (three) times daily as needed for dizziness.   meclizine (ANTIVERT) 25 MG tablet Take 1 tablet (25 mg total) by mouth 3 (three) times daily as needed for dizziness.   pravastatin (PRAVACHOL) 40 MG tablet TAKE 1 TABLET BY MOUTH EVERYDAY AT BEDTIME   Specialty Vitamins Products ([facility] HAIR/SKIN/NAILS) TABS Take 1 tablet by mouth daily.    No facility-administered medications prior to visit.    Review of Systems  Constitutional:  Negative for appetite change, chills, fatigue and fever.  Respiratory:  Negative for chest tightness and shortness of breath.   Cardiovascular:  Negative for chest pain and palpitations.  Gastrointestinal:  Negative for abdominal pain, nausea and vomiting.  Neurological:  Negative for dizziness and weakness.       Objective    BP (!) 126/59 (BP Location: Left Arm, Patient Position: Sitting, Cuff Size: Large)   Pulse 66   Wt  208 lb (94.3 kg)   SpO2 98%   BMI 34.61 kg/m    Physical Exam   General: Appearance:    Mildly obese female in no acute distress  Eyes:    PERRL, conjunctiva/corneas clear, EOM's intact       Lungs:     Clear to auscultation bilaterally, respirations unlabored  Heart:    Normal heart rate. Normal rhythm. No murmurs, rubs, or gallops.    MS:   All extremities are intact.    Neurologic:   Awake, alert, oriented x 3. No apparent focal neurological defect.         Assessment & Plan     1. Pure  hypercholesterolemia She is tolerating pravastatin well with no adverse effects.   - CBC - Lipid panel  2. 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   3. Obstructive sleep apnea syndrome She had been using CPAP consistently prior to last month and has been more fatigued since then. She anticipates starting back on her CPAP machine.   4. Excessive daytime sleepiness Multifactorial, but likely exacerbated by being off of CPAP for the last month, which she is planning on resuming soon. Consider adding provigil if she does not see significant improvement after resuming CPAP.   5. Numbness and tingling in both hands  - CBC - Vitamin B12  6. Other fatigue   7. Smoking greater than 20 pack years  - Ambulatory Referral for Lung Cancer Screening [REF832]      The entirety of the information documented in the History of Present Illness, Review of Systems and Physical Exam were personally obtained by me. Portions of this information were initially documented by the CMA and reviewed by me for thoroughness and accuracy.     Lelon Huh, MD  [facility] [phone] (phone) [phone] (fax)  [place]

## 2022-12-03 ENCOUNTER — Encounter: Payer: Self-pay | Admitting: Family Medicine

## 2022-12-03 DIAGNOSIS — E538 Deficiency of other specified B group vitamins: Secondary | ICD-10-CM | POA: Insufficient documentation

## 2022-12-03 LAB — CBC
Hematocrit: 43 % (ref 34.0–46.6)
Hemoglobin: 14 g/dL (ref 11.1–15.9)
MCH: 29 pg (ref 26.6–33.0)
MCHC: 32.6 g/dL (ref 31.5–35.7)
MCV: 89 fL (ref 79–97)
Platelets: 167 10*3/uL (ref 150–450)
RBC: 4.82 x10E6/uL (ref 3.77–5.28)
RDW: 13 % (ref 11.7–15.4)
WBC: 5.8 10*3/uL (ref 3.4–10.8)

## 2022-12-03 LAB — LIPID PANEL
Chol/HDL Ratio: 4 ratio (ref 0.0–4.4)
Cholesterol, Total: 197 mg/dL (ref 100–199)
HDL: 49 mg/dL (ref >39)
LDL Chol Calc (NIH): 124 mg/dL — ABNORMAL HIGH (ref 0–99)
Triglycerides: 136 mg/dL (ref 0–149)
VLDL Cholesterol Cal: 24 mg/dL (ref 5–40)

## 2022-12-03 LAB — VITAMIN B12: Vitamin B-12: 287 pg/mL (ref 232–1245)

## 2022-12-11 DIAGNOSIS — L570 Actinic keratosis: Secondary | ICD-10-CM | POA: Diagnosis not present

## 2022-12-11 DIAGNOSIS — X32XXXA Exposure to sunlight, initial encounter: Secondary | ICD-10-CM | POA: Diagnosis not present

## 2022-12-11 DIAGNOSIS — L821 Other seborrheic keratosis: Secondary | ICD-10-CM | POA: Diagnosis not present

## 2022-12-11 DIAGNOSIS — L920 Granuloma annulare: Secondary | ICD-10-CM | POA: Diagnosis not present

## 2022-12-11 DIAGNOSIS — L578 Other skin changes due to chronic exposure to nonionizing radiation: Secondary | ICD-10-CM | POA: Diagnosis not present

## 2022-12-11 DIAGNOSIS — L820 Inflamed seborrheic keratosis: Secondary | ICD-10-CM | POA: Diagnosis not present

## 2022-12-11 DIAGNOSIS — L538 Other specified erythematous conditions: Secondary | ICD-10-CM | POA: Diagnosis not present

## 2022-12-16 ENCOUNTER — Other Ambulatory Visit: Payer: Self-pay | Admitting: Family Medicine

## 2022-12-16 DIAGNOSIS — E785 Hyperlipidemia, unspecified: Secondary | ICD-10-CM

## 2022-12-16 NOTE — Telephone Encounter (Signed)
Requested Prescriptions  Pending Prescriptions Disp Refills   pravastatin (PRAVACHOL) 40 MG tablet [Pharmacy Med Name: PRAVASTATIN SODIUM 40 MG TAB] 90 tablet 3    Sig: TAKE 1 TABLET BY MOUTH EVERYDAY AT BEDTIME     Cardiovascular:  Antilipid - Statins Failed - 12/16/2022  1:31 AM      Failed - Lipid Panel in normal range within the last 12 months    Cholesterol, Total  Date Value Ref Range Status  12/02/2022 197 100 - 199 mg/dL Final   LDL Chol Calc (NIH)  Date Value Ref Range Status  12/02/2022 124 (H) 0 - 99 mg/dL Final   HDL  Date Value Ref Range Status  12/02/2022 49 >39 mg/dL Final   Triglycerides  Date Value Ref Range Status  12/02/2022 136 0 - 149 mg/dL Final         Passed - Patient is not pregnant      Passed - Valid encounter within last 12 months    Recent Outpatient Visits           2 weeks ago Pure hypercholesterolemia   [place], Donald E, MD   7 months ago Medicare annual wellness visit, subsequent   [facility] [name], MD   1 year ago 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

## 2022-12-17 ENCOUNTER — Other Ambulatory Visit: Payer: Self-pay | Admitting: Family Medicine

## 2022-12-17 DIAGNOSIS — F419 Anxiety disorder, unspecified: Secondary | ICD-10-CM

## 2022-12-17 NOTE — Telephone Encounter (Signed)
Requested Prescriptions  Pending Prescriptions Disp Refills   ALPRAZolam (XANAX) 0.5 MG tablet [Pharmacy Med Name: ALPRAZOLAM 0.5 MG TABLET] 45 tablet     Sig: TAKE 1-2 TABLETS (0.5-1 MG TOTAL) BY MOUTH AT BEDTIME.     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   [place], Donald E, MD   1 year ago Pure hypercholesterolemia   [place], Donald E, MD   2 years ago 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   [place], MD       Future Appointments             In 2 months Fisher, Kirstie Peri, MD [place], [facility]             FLUoxetine (PROZAC) 20 MG capsule [Pharmacy Med Name: FLUOXETINE HCL 20 MG CAPSULE] 270 capsule 1    Sig: TAKE 3 [place]     Psychiatry:  Antidepressants - SSRI Passed - 12/17/2022 11:16 AM      Passed - Valid encounter within last 6 months    Recent Outpatient Visits           2 weeks ago Pure hypercholesterolemia   [place], Donald E, MD   7 months ago Medicare annual wellness visit, subsequent   [facility] [name], MD   1 year ago 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, [facility]

## 2022-12-17 NOTE — Telephone Encounter (Signed)
Requested medications are due for refill today.  unsure  Requested medications are on the active medications list.  yes  Last refill. 07/16/2022 #45 3 rf  Future visit scheduled.   yes  Notes to clinic.  Refill not delegated.    Requested Prescriptions  Pending Prescriptions Disp Refills   ALPRAZolam (XANAX) 0.5 MG tablet [Pharmacy Med Name: ALPRAZOLAM 0.5 MG TABLET] 45 tablet     Sig: TAKE 1-2 TABLETS (0.5-1 MG TOTAL) BY MOUTH AT BEDTIME.     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   [place], Donald E, MD   1 year ago Pure hypercholesterolemia   [place], Donald E, MD   2 years ago 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   [place], MD       Future Appointments             In 2 months Fisher, Kirstie Peri, MD [facility], [facility]            Signed Prescriptions Disp Refills   FLUoxetine (PROZAC) 20 MG capsule 270 capsule 1    Sig: TAKE 3 CAPSULES BY [place]     Psychiatry:  Antidepressants - SSRI Passed - 12/17/2022 11:16 AM      Passed - Valid encounter within last 6 months    Recent Outpatient Visits           2 weeks ago Pure hypercholesterolemia   [place], Donald E, MD    7 months ago Medicare annual wellness visit, subsequent   [facility] [name], MD   1 year ago 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

## 2022-12-20 ENCOUNTER — Other Ambulatory Visit: Payer: Self-pay | Admitting: Family Medicine

## 2022-12-20 NOTE — Telephone Encounter (Signed)
Requested medications are due for refill today.  yes  Requested medications are on the active medications list.  yes  Last refill. 11/20/2022 #120 0 rf  Future visit scheduled.   yes  Notes to clinic.  Refill not delegated.    Requested Prescriptions  Pending Prescriptions Disp Refills   HYDROcodone-acetaminophen (NORCO) 7.5-325 MG tablet 120 tablet 0    Sig: Take 1 tablet by mouth 4 (four) times daily as needed for moderate pain.     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

## 2022-12-20 NOTE — Telephone Encounter (Unsigned)
Copied from [place] [id number]. Topic: General - Other >> Dec 20, 2022 10:12 AM Everette C wrote: Reason for CRM: Medication Refill - Medication: HYDROcodone-acetaminophen ([place]) 7.5-325 MG tablet [[id number]]  Has the patient contacted their pharmacy? No. (Agent: If no, request that the patient contact the pharmacy for the refill. If patient does not wish to contact the pharmacy document the reason why and proceed with request.) (Agent: If yes, when and what did the pharmacy advise?)  Preferred Pharmacy (with phone number or street name): [id number] [address], [place] [address] Phone: [phone] Fax: [phone] Hours: Not open 24 hours   Has the patient been seen for an appointment in the last year OR does the patient have an upcoming appointment? Yes.    Agent: Please be advised that RX refills may take up to 3 business days. We ask that you follow-up with your pharmacy.

## 2022-12-22 MED ORDER — HYDROCODONE-ACETAMINOPHEN 7.5-325 MG PO TABS: 1.0000 | ORAL_TABLET | Freq: Four times a day (QID) | ORAL | 0 refills | Status: DC | PRN

## 2023-01-06 ENCOUNTER — Telehealth: Payer: Self-pay | Admitting: Family Medicine

## 2023-01-06 ENCOUNTER — Other Ambulatory Visit: Payer: Self-pay

## 2023-01-06 DIAGNOSIS — K219 Gastro-esophageal reflux disease without esophagitis: Secondary | ICD-10-CM

## 2023-01-06 NOTE — Telephone Encounter (Signed)
[facility] faxed refill request for the following medications:   esomeprazole (NEXIUM) 40 MG capsule    Please advise.

## 2023-01-06 NOTE — Telephone Encounter (Signed)
Too soon for refill.  Receipt confirmation in November for 90 x 4 at [place]

## 2023-01-20 ENCOUNTER — Other Ambulatory Visit: Payer: Self-pay | Admitting: Family Medicine

## 2023-01-20 NOTE — Telephone Encounter (Signed)
Medication Refill - Medication: HYDROcodone-acetaminophen (NORCO) 7.5-325 MG tablet   Has the patient contacted their pharmacy? No.  Preferred Pharmacy (with phone number or street name):  [id number] [address], [place] Phone: [phone]  Fax: [phone]     Has the patient been seen for an appointment in the last year OR does the patient have an upcoming appointment? Yes.    Agent: Please be advised that RX refills may take up to 3 business days. We ask that you follow-up with your pharmacy.

## 2023-01-21 MED ORDER — HYDROCODONE-ACETAMINOPHEN 7.5-325 MG PO TABS: 1.0000 | ORAL_TABLET | Freq: Four times a day (QID) | ORAL | 0 refills | Status: DC | PRN

## 2023-01-21 NOTE — Telephone Encounter (Signed)
Requested medication (s) are due for refill today: Yes  Requested medication (s) are on the active medication list: Yes  Last refill:  12/22/22  Future visit scheduled: Yes  Notes to clinic:  See request.    Requested Prescriptions  Pending Prescriptions Disp Refills   HYDROcodone-acetaminophen (NORCO) 7.5-325 MG tablet 120 tablet 0    Sig: Take 1 tablet by mouth 4 (four) times daily as needed for moderate pain.     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

## 2023-02-19 ENCOUNTER — Other Ambulatory Visit: Payer: Self-pay | Admitting: Family Medicine

## 2023-02-19 NOTE — Telephone Encounter (Signed)
Medication Refill - Medication: HYDROcodone-acetaminophen (NORCO) 7.5-325 MG tablet [id number]   Has the patient contacted their pharmacy? Yes.    (Agent: If yes, when and what did the pharmacy advise?) Contact Pharmacy   Preferred Pharmacy (with phone number or street name): [id number] - [place], [place]   Has the patient been seen for an appointment in the last year OR does the patient have an upcoming appointment? Yes.    Agent: Please be advised that RX refills may take up to 3 business days. We ask that you follow-up with your pharmacy.

## 2023-02-19 NOTE — Telephone Encounter (Signed)
Requested medication (s) are due for refill today: yes  Requested medication (s) are on the active medication list: yes  Last refill:  01/21/23  Future visit scheduled: yes  Notes to clinic:  Unable to refill per protocol, cannot delegate.      Requested Prescriptions  Pending Prescriptions Disp Refills   HYDROcodone-acetaminophen (NORCO) 7.5-325 MG tablet 120 tablet 0    Sig: Take 1 tablet by mouth 4 (four) times daily as needed for moderate pain.     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

## 2023-02-20 MED ORDER — HYDROCODONE-ACETAMINOPHEN 7.5-325 MG PO TABS: 1.0000 | ORAL_TABLET | Freq: Four times a day (QID) | ORAL | 0 refills | Status: DC | PRN

## 2023-02-27 DIAGNOSIS — L578 Other skin changes due to chronic exposure to nonionizing radiation: Secondary | ICD-10-CM | POA: Diagnosis not present

## 2023-02-27 DIAGNOSIS — L570 Actinic keratosis: Secondary | ICD-10-CM | POA: Diagnosis not present

## 2023-02-28 ENCOUNTER — Ambulatory Visit (INDEPENDENT_AMBULATORY_CARE_PROVIDER_SITE_OTHER): Payer: Medicare Other | Admitting: Family Medicine

## 2023-02-28 ENCOUNTER — Encounter: Payer: Self-pay | Admitting: Family Medicine

## 2023-02-28 ENCOUNTER — Encounter: Payer: Self-pay | Admitting: Student

## 2023-02-28 VITALS — BP 119/73 | HR 72 | Ht 64.0 in | Wt 204.0 lb

## 2023-02-28 DIAGNOSIS — N63 Unspecified lump in unspecified breast: Secondary | ICD-10-CM

## 2023-02-28 DIAGNOSIS — R202 Paresthesia of skin: Secondary | ICD-10-CM | POA: Diagnosis not present

## 2023-02-28 DIAGNOSIS — E538 Deficiency of other specified B group vitamins: Secondary | ICD-10-CM | POA: Diagnosis not present

## 2023-02-28 DIAGNOSIS — R200 Anesthesia of skin: Secondary | ICD-10-CM

## 2023-02-28 DIAGNOSIS — N644 Mastodynia: Secondary | ICD-10-CM

## 2023-02-28 NOTE — Progress Notes (Unsigned)
Established patient visit   Patient: Alejandra Little   DOB: Nov 10, 1956   67 y.o. Female  MRN: [id number] Visit Date: 02/28/2023  Today's healthcare provider: Mila Merry, MD   No chief complaint on file.  Subjective    HPI She reports onset of intermittent pain in upper outer right breast starting about a week and half ago. Often feels like a stinging or burning related to certain arm movement. No known injury and no history of similar pains in the past. Her last screening mammogram was normal. In August 2023.   She is also due to follow up for numbness and tingling in feet, fatigue and b12 deficiency since starting starting b12 supplements after last lab finding of B12 level of 287 on 287. She states it has made a difference, she feels he energy level is significantly better and no longer having numbness and tingling in feet.  Medications: Outpatient Medications Prior to Visit  Medication Sig   ALPRAZolam (XANAX) 0.5 MG tablet TAKE 1-2 TABLETS (0.5-1 MG TOTAL) BY MOUTH AT BEDTIME.   cyanocobalamin (VITAMIN B12) 1000 MCG tablet Take 1,000 mcg by mouth daily.   esomeprazole (NEXIUM) 40 MG capsule TAKE 1 CAPSULE BY MOUTH EVERY DAY   FLUoxetine (PROZAC) 20 MG capsule TAKE 3 CAPSULES BY MOUTH EVERY DAY   gabapentin (NEURONTIN) 300 MG capsule TAKE 1 CAPSULE (300MG ) BY MOUTH THREE TIMES DAILY AND TAKE 2 CAPSULES (600MG ) BY MOUTH AT BEDTIME   HYDROcodone-acetaminophen (NORCO) 7.5-325 MG tablet Take 1 tablet by mouth 4 (four) times daily as needed for moderate pain.   meclizine (ANTIVERT) 25 MG tablet Take 1 tablet (25 mg total) by mouth 3 (three) times daily as needed for dizziness.   meclizine (ANTIVERT) 25 MG tablet Take 1 tablet (25 mg total) by mouth 3 (three) times daily as needed for dizziness.   pravastatin (PRAVACHOL) 40 MG tablet TAKE 1 TABLET BY MOUTH EVERYDAY AT BEDTIME   Specialty Vitamins Products ([facility] HAIR/SKIN/NAILS) TABS Take 1 tablet by mouth daily.    No  facility-administered medications prior to visit.    Review of Systems  {Labs  Heme  Chem  Endocrine  Serology  Results Review (optional):23779}   Objective    BP 119/73 (BP Location: Left Arm, Patient Position: Sitting, Cuff Size: Large)   Pulse 72   Ht 5\' 4"  (1.626 m)   Wt 204 lb (92.5 kg)   SpO2 98%   BMI 35.02 kg/m  {Show previous vital signs (optional):23777}  Physical Exam  General appearance: Mildly obese female, cooperative and in no acute distress Head: Normocephalic, without obvious abnormality, atraumatic Respiratory: Respirations even and unlabored, normal respiratory rate Extremities: All extremities are intact.  Breast: Skin color, texture, turgor normal. No rashes seen. Small tender pea size nodule of upper outer quadrant of right breat at about 10 o'clock.  Psych: Appropriate mood and affect. Neurologic: Mental status: Alert, oriented to person, place, and time, thought content appropriate.   Assessment & Plan     1. Breast tenderness in female  2. Breast nodule  - MM 3D DIAGNOSTIC MAMMOGRAM UNILATERAL RIGHT BREAST; Future - [place] LIMITED ULTRASOUND INCLUDING AXILLA RIGHT BREAST; Future  3. B12 deficiency Feeling improved energy  since starting b12 supplement in January. Is to continue cyanocobalamin (VITAMIN B12) 1000 MCG tablet; Take 1,000 mcg by mouth daily.  4. Numbness and tingling in both hands Improved since starting B12 supplement.       The entirety of the information documented in  the History of Present Illness, Review of Systems and Physical Exam were personally obtained by me. Portions of this information were initially documented by the CMA and reviewed by me for thoroughness and accuracy.     Mila Merry, MD  [facility] [phone] (phone) [phone] (fax)  [facility]

## 2023-03-05 ENCOUNTER — Ambulatory Visit: Payer: Medicare Other | Admitting: Family Medicine

## 2023-03-11 ENCOUNTER — Ambulatory Visit
Admission: RE | Admit: 2023-03-11 | Discharge: 2023-03-11 | Disposition: A | Discharge status: Home / Self Care | Payer: Medicare Other | Source: Ambulatory Visit | Attending: Family Medicine | Admitting: Family Medicine

## 2023-03-11 DIAGNOSIS — N6489 Other specified disorders of breast: Secondary | ICD-10-CM | POA: Diagnosis not present

## 2023-03-11 DIAGNOSIS — N63 Unspecified lump in unspecified breast: Secondary | ICD-10-CM

## 2023-03-11 DIAGNOSIS — N644 Mastodynia: Secondary | ICD-10-CM

## 2023-03-11 DIAGNOSIS — R92311 Mammographic fatty tissue density, right breast: Secondary | ICD-10-CM | POA: Diagnosis not present

## 2023-03-25 ENCOUNTER — Other Ambulatory Visit: Payer: Self-pay | Admitting: Family Medicine

## 2023-03-25 NOTE — Telephone Encounter (Signed)
Requested medications are due for refill today.  yes  Requested medications are on the active medications list.  yes  Last refill. 02/20/2023 #120 0 rf  Future visit scheduled.   yes  Notes to clinic.  Refill not delegated.    Requested Prescriptions  Pending Prescriptions Disp Refills   HYDROcodone-acetaminophen (NORCO) 7.5-325 MG tablet 120 tablet 0    Sig: Take 1 tablet by mouth 4 (four) times daily as needed for moderate pain.     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

## 2023-03-25 NOTE — Telephone Encounter (Signed)
Medication Refill - Medication: hydrocodone 7.5/325  Has the patient contacted their pharmacy? No. (Agent: If no, request that the patient contact the pharmacy for the refill. If patient does not wish to contact the pharmacy document the reason why and proceed with request.) (Agent: If yes, when and what did the pharmacy advise?)  Preferred Pharmacy (with phone number or street name): [facility] [address]  Has the patient been seen for an appointment in the last year OR does the patient have an upcoming appointment? yes  Agent: Please be advised that RX refills may take up to 3 business days. We ask that you follow-up with your pharmacy.

## 2023-03-26 MED ORDER — HYDROCODONE-ACETAMINOPHEN 7.5-325 MG PO TABS: 1.0000 | ORAL_TABLET | Freq: Four times a day (QID) | ORAL | 0 refills | Status: DC | PRN

## 2023-04-21 DIAGNOSIS — M1711 Unilateral primary osteoarthritis, right knee: Secondary | ICD-10-CM | POA: Diagnosis not present

## 2023-04-24 ENCOUNTER — Other Ambulatory Visit: Payer: Self-pay | Admitting: Family Medicine

## 2023-04-24 DIAGNOSIS — F419 Anxiety disorder, unspecified: Secondary | ICD-10-CM

## 2023-04-24 NOTE — Telephone Encounter (Signed)
Requested medication (s) are due for refill today - yes  Requested medication (s) are on the active medication list -yes  Future visit scheduled -yes  Last refill: 03/26/23 #120  Notes to clinic: non delegated Rx  Requested Prescriptions  Pending Prescriptions Disp Refills   HYDROcodone-acetaminophen (NORCO) 7.5-325 MG tablet 120 tablet 0    Sig: Take 1 tablet by mouth 4 (four) times daily as needed for moderate pain.     Not Delegated - Analgesics:  Opioid Agonist Combinations Failed - 04/24/2023 12:27 PM      Failed - This refill cannot be delegated      Failed - Urine Drug Screen completed in last 360 days      Passed - Valid encounter within last 3 months    Recent Outpatient Visits           1 month ago Breast tenderness in female   [facility] Malva Limes, MD   4 months ago Pure hypercholesterolemia   [facility] Malva Limes, MD   11 months ago Medicare annual wellness visit, subsequent   [facility] Malva Limes, MD   1 year ago 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     Not Delegated - Analgesics:  Opioid Agonist Combinations Failed - 04/24/2023 12:27 PM      Failed - This refill cannot be delegated      Failed - Urine Drug Screen completed in last 360 days      Passed - Valid encounter within last 3 months    Recent Outpatient Visits            1 month ago Breast tenderness in female   [facility] Malva Limes, MD   4 months ago Pure hypercholesterolemia   [facility] Malva Limes, MD   11 months ago Medicare annual wellness visit, subsequent   [facility] Malva Limes, MD   1 year ago 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

## 2023-04-24 NOTE — Telephone Encounter (Signed)
Medication Refill - Medication: HYDROcodone-acetaminophen (NORCO) 7.5-325 MG tablet  Has the patient contacted their pharmacy? Yes.   Pt states that she is told to call PCP for refills.   Preferred Pharmacy (with phone number or street name): [id number] [place], [place] - [address] AT [facility] [address]  Phone: [phone] Fax: [phone]  Has the patient been seen for an appointment in the last year OR does the patient have an upcoming appointment? Yes.    Agent: Please be advised that RX refills may take up to 3 business days. We ask that you follow-up with your pharmacy.

## 2023-04-24 NOTE — Telephone Encounter (Signed)
Requested medication (s) are due for refill today: yes  Requested medication (s) are on the active medication list: yes  Last refill:  12/19/22 #45/3   Future visit scheduled: yes  Notes to clinic:  Unable to refill per protocol, cannot delegate.    Requested Prescriptions  Pending Prescriptions Disp Refills   ALPRAZolam (XANAX) 0.5 MG tablet [Pharmacy Med Name: ALPRAZOLAM 0.5 MG TABLET] 45 tablet 3    Sig: TAKE 1-2 TABLETS (0.5-1 MG TOTAL) BY MOUTH AT BEDTIME.     Not Delegated - Psychiatry: Anxiolytics/Hypnotics 2 Failed - 04/24/2023 11:54 AM      Failed - This refill cannot be delegated      Failed - Urine Drug Screen completed in last 360 days      Passed - Patient is not pregnant      Passed - Valid encounter within last 6 months    Recent Outpatient Visits           1 month ago Breast tenderness in female   [facility] Malva Limes, MD   4 months ago Pure hypercholesterolemia   [facility] Malva Limes, MD   11 months ago Medicare annual wellness visit, subsequent   [facility] Malva Limes, MD   1 year ago 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

## 2023-04-25 MED ORDER — HYDROCODONE-ACETAMINOPHEN 7.5-325 MG PO TABS: 1.0000 | ORAL_TABLET | Freq: Four times a day (QID) | ORAL | 0 refills | Status: DC | PRN

## 2023-04-28 ENCOUNTER — Other Ambulatory Visit: Payer: Self-pay | Admitting: Family Medicine

## 2023-04-28 DIAGNOSIS — F419 Anxiety disorder, unspecified: Secondary | ICD-10-CM

## 2023-04-28 NOTE — Telephone Encounter (Unsigned)
Copied from CRM [id number]. Topic: General - Other >> Apr 28, 2023  2:02 PM Everette C wrote: Reason for CRM: Medication Refill - Medication: ALPRAZolam (XANAX) 0.5 MG tablet [[id number]]  Has the patient contacted their pharmacy? Yes.   (Agent: If no, request that the patient contact the pharmacy for the refill. If patient does not wish to contact the pharmacy document the reason why and proceed with request.) (Agent: If yes, when and what did the pharmacy advise?)  Preferred Pharmacy (with phone number or street name[facility] [address] [place] - [address] [place] [address] Phone: [phone] Fax: [phone] Hours: Not open 24 hours  Has the patient been seen for an appointment in the last year OR does the patient have an upcoming appointment? Yes.    Agent: Please be advised that RX refills may take up to 3 business days. We ask that you follow-up with your pharmacy.

## 2023-04-29 NOTE — Telephone Encounter (Signed)
Unable to refill per protocol, last refill by provider 04/28/23, duplicate request.  Requested Prescriptions  Pending Prescriptions Disp Refills   ALPRAZolam (XANAX) 0.5 MG tablet 45 tablet 3    Sig: Take 1-2 tablets (0.5-1 mg total) by mouth at bedtime.     Not Delegated - Psychiatry: Anxiolytics/Hypnotics 2 Failed - 04/28/2023  2:26 PM      Failed - This refill cannot be delegated      Failed - Urine Drug Screen completed in last 360 days      Passed - Patient is not pregnant      Passed - Valid encounter within last 6 months    Recent Outpatient Visits           2 months ago Breast tenderness in female   [facility] Malva Limes, MD   4 months ago Pure hypercholesterolemia   [facility] Malva Limes, MD   11 months ago Medicare annual wellness visit, subsequent   [facility] Malva Limes, MD   1 year ago 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

## 2023-05-05 NOTE — Progress Notes (Unsigned)
      Established patient visit   Patient: Alejandra Little   DOB: [birth year]   67 y.o. Female  MRN: [id number] Visit Date: 05/06/2023  Today's healthcare provider: Mila Merry, MD   No chief complaint on file.  Subjective    HPI  ***  Medications: Outpatient Medications Prior to Visit  Medication Sig   ALPRAZolam (XANAX) 0.5 MG tablet TAKE 1-2 TABLETS (0.5-1 MG TOTAL) BY MOUTH AT BEDTIME.   cyanocobalamin (VITAMIN B12) 1000 MCG tablet Take 1,000 mcg by mouth daily.   esomeprazole (NEXIUM) 40 MG capsule TAKE 1 CAPSULE BY MOUTH EVERY DAY   FLUoxetine (PROZAC) 20 MG capsule TAKE 3 CAPSULES BY MOUTH EVERY DAY   gabapentin (NEURONTIN) 300 MG capsule TAKE 1 CAPSULE (300MG ) BY MOUTH THREE TIMES DAILY AND TAKE 2 CAPSULES (600MG ) BY MOUTH AT BEDTIME   HYDROcodone-acetaminophen (NORCO) 7.5-325 MG tablet Take 1 tablet by mouth 4 (four) times daily as needed for moderate pain.   meclizine (ANTIVERT) 25 MG tablet Take 1 tablet (25 mg total) by mouth 3 (three) times daily as needed for dizziness.   meclizine (ANTIVERT) 25 MG tablet Take 1 tablet (25 mg total) by mouth 3 (three) times daily as needed for dizziness.   pravastatin (PRAVACHOL) 40 MG tablet TAKE 1 TABLET BY MOUTH EVERYDAY AT BEDTIME   Specialty Vitamins Products ([facility] HAIR/SKIN/NAILS) TABS Take 1 tablet by mouth daily.    No facility-administered medications prior to visit.    Review of Systems  {Labs  Heme  Chem  Endocrine  Serology  Results Review (optional):23779}   Objective    There were no vitals taken for this visit. {Show previous vital signs (optional):23777}  Physical Exam  ***  No results found for any visits on 05/06/23.  Assessment & Plan     ***  No follow-ups on file.      {provider attestation***:1}   Mila Merry, MD  [facility] [phone] (phone) [phone] (fax)  [facility]

## 2023-05-06 ENCOUNTER — Ambulatory Visit (INDEPENDENT_AMBULATORY_CARE_PROVIDER_SITE_OTHER): Payer: Medicare Other | Admitting: Family Medicine

## 2023-05-06 ENCOUNTER — Encounter: Payer: Self-pay | Admitting: Family Medicine

## 2023-05-06 VITALS — BP 116/75 | HR 72 | Temp 98.1°F | Resp 12 | Wt 202.3 lb

## 2023-05-06 DIAGNOSIS — F5104 Psychophysiologic insomnia: Secondary | ICD-10-CM

## 2023-05-06 DIAGNOSIS — G4733 Obstructive sleep apnea (adult) (pediatric): Secondary | ICD-10-CM | POA: Diagnosis not present

## 2023-05-06 DIAGNOSIS — F17210 Nicotine dependence, cigarettes, uncomplicated: Secondary | ICD-10-CM | POA: Diagnosis not present

## 2023-05-06 DIAGNOSIS — F0631 Mood disorder due to known physiological condition with depressive features: Secondary | ICD-10-CM

## 2023-05-06 DIAGNOSIS — F419 Anxiety disorder, unspecified: Secondary | ICD-10-CM | POA: Diagnosis not present

## 2023-05-06 DIAGNOSIS — E538 Deficiency of other specified B group vitamins: Secondary | ICD-10-CM

## 2023-05-06 NOTE — Patient Instructions (Signed)
.   Please review the attached list of medications and notify my office if there are any errors.   . Please bring all of your medications to every appointment so we can make sure that our medication list is the same as yours.   

## 2023-05-12 ENCOUNTER — Ambulatory Visit (INDEPENDENT_AMBULATORY_CARE_PROVIDER_SITE_OTHER): Payer: Medicare Other

## 2023-05-12 VITALS — Ht 64.0 in | Wt 202.0 lb

## 2023-05-12 DIAGNOSIS — Z0000 Encounter for general adult medical examination without abnormal findings: Secondary | ICD-10-CM

## 2023-05-12 NOTE — Patient Instructions (Signed)
Alejandra Little , Thank you for taking time to come for your Medicare Wellness Visit. I appreciate your ongoing commitment to your health goals. Please review the following plan we discussed and let me know if I can assist you in the future.   These are the goals we discussed:  Goals      Cut out extra servings     Recommend to cut out all junk food when snacking and replace with fruit and vegetables.      Increase water intake     Recommend increasing water intake to 3 glasses a day.     LIFESTYLE - DECREASE FALLS RISK     Recommend to remove any items from the home that may cause slips or trips.     RNCM: Effective Management of Pain     Care Coordination Interventions: 07-09-2022: The patient is rating her back pain at a 10 when she gets up. Having spasms in her back.  Reviewed provider established plan for pain management. 07-09-2022: The patient has pain medications but states that the pain meds are not helping her. She thinks she may have done this last week when she was weed eating. She says this generally happens 2 times a year. 07-09-2022 at 238 pm: Call made back to the patient and offered the patient assistance in calling the office to get an appointment with Dr. Sherrie Mustache. The patient states that she is going to "wait it out" and see if it gets better. Education and support given. The number to the office is [phone]. Discussed importance of adherence to all scheduled medical appointments. Saw the pcp on 05-08-2022. A message has been sent to the pcp for recommendations Counseled on the importance of reporting any/all new or changed pain symptoms or management strategies to pain management provider Advised patient to report to care team affect of pain on daily activities. 07-09-2022: The patient states she has not been able to do any of her usual activities due to the pain in her lower back.  Discussed use of relaxation techniques and/or diversional activities to assist with pain reduction  (distraction, imagery, relaxation, massage, acupressure, TENS, heat, and cold application. 07-09-2022: The patient states the only relief she has had was standing in the shower and allowing the hottest water she can to hit her back where it hurts. Education on using a heating pad with 20 minutes of heat on and 20 minutes of heat off. The patient is going to try this. Education on making sure that she does not burn herself and be mindful of the area that she is applying the heat to.  Reviewed with patient prescribed pharmacological and nonpharmacological pain relief strategies. 07-09-2022: The patient has pain medications but it is not helping. The RNCM has reached out to the pcp for recommendations with patients expressed needs.  Collaboration with the pcp, Dr. Sherrie Mustache concerning the patient having an acute exacerbation of her back pain and discomfort. She is asking for recommendations. Secure chat with Dr. Sherrie Mustache for recommendations for the patient.  Advised patient to discuss unresolved pain, changes in level and intensity of pain, plan of care for effective pain management with provider Screening for signs and symptoms of depression related to chronic disease state  Assessed social determinant of health barriers AWV completed on 05-08-2022   -took herself off of morphine but still experiencing back pain. 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 Pain tends to increase during cooler weather. -problems with pharmacy carrying prescribed Norco..was  not available at [facility]. Last prescription filled at [facility] Will call next week if medication is not available and order is needed for a different pharmacy.            This is a list of the screening recommended for you and due dates:  Health Maintenance  Topic Date Due   Screening for Lung Cancer  Never done   Zoster (Shingles) Vaccine (1 of 2) Never done   Flu Shot  06/19/2023   Medicare Annual Wellness Visit  05/11/2024   Mammogram  07/18/2024   DEXA scan (bone density measurement)  07/18/2025   Colon Cancer Screening  08/17/2025   DTaP/Tdap/Td vaccine (2 - Td or Tdap) 09/06/2025   Pneumonia Vaccine  Completed   COVID-19 Vaccine  Completed   Hepatitis C Screening  Completed   HPV Vaccine  Aged Out    Advanced directives: no  Conditions/risks identified: low falls risk  Next appointment: Follow up in one year for your annual wellness visit 05/12/2024 @ 2pm telephone   Preventive Care 65 Years and Older, Female Preventive care refers to lifestyle choices and visits with your health care provider that can promote health and wellness. What does preventive care include? A yearly physical exam. This is also called an annual well check. Dental exams once or twice a year. Routine eye exams. Ask your health care provider how often you should have your eyes checked. Personal lifestyle choices, including: Daily care of your teeth and gums. Regular physical activity. Eating a healthy diet. Avoiding tobacco and drug use. Limiting alcohol use. Practicing safe sex. Taking low-dose aspirin every day. Taking vitamin and mineral supplements as recommended by your health care provider. What happens during an annual well check? The services and screenings done by your health care provider during your annual well check will depend on your age, overall health, lifestyle risk factors, and family history of disease. Counseling  Your health care provider may ask you questions about  your: Alcohol use. Tobacco use. Drug use. Emotional well-being. Home and relationship well-being. Sexual activity. Eating habits. History of falls. Memory and ability to understand (cognition). Work and work Astronomer. Reproductive health. Screening  You may have the following tests or measurements: Height, weight, and BMI. Blood pressure. Lipid and cholesterol levels. These may be checked every 5 years, or more frequently if you are over 32 years old. Skin check. Lung cancer screening. You may have this screening every year starting at age 6 if you have a 30-pack-year history of smoking and currently smoke or have quit within the past 15 years. Fecal occult blood test (FOBT) of the stool. You may have this test every year starting at age 17. Flexible sigmoidoscopy or colonoscopy. You may have a sigmoidoscopy every 5 years or a colonoscopy every 10 years starting at age 28. Hepatitis C blood test. Hepatitis B blood test. Sexually transmitted disease (STD) testing. Diabetes screening. This is done by checking your blood sugar (glucose) after you have not eaten for a while (fasting). You may have this done every 1-3 years. Bone density scan. This is done to screen  for osteoporosis. You may have this done starting at age 71. Mammogram. This may be done every 1-2 years. Talk to your health care provider about how often you should have regular mammograms. Talk with your health care provider about your test results, treatment options, and if necessary, the need for more tests. Vaccines  Your health care provider may recommend certain vaccines, such as: Influenza vaccine. This is recommended every year. Tetanus, diphtheria, and acellular pertussis (Tdap, Td) vaccine. You may need a Td booster every 10 years. Zoster vaccine. You may need this after age 22. Pneumococcal 13-valent conjugate (PCV13) vaccine. One dose is recommended after age 46. Pneumococcal polysaccharide (PPSV23) vaccine.  One dose is recommended after age 25. Talk to your health care provider about which screenings and vaccines you need and how often you need them. This information is not intended to replace advice given to you by your health care provider. Make sure you discuss any questions you have with your health care provider. Document Released: 12/01/2015 Document Revised: 07/24/2016 Document Reviewed: 09/05/2015 Elsevier Interactive Patient Education  2017 [employer].  Fall Prevention in the Home Falls can cause injuries. They can happen to people of all ages. There are many things you can do to make your home safe and to help prevent falls. What can I do on the outside of my home? Regularly fix the edges of walkways and driveways and fix any cracks. Remove anything that might make you trip as you walk through a door, such as a raised step or threshold. Trim any bushes or trees on the path to your home. Use bright outdoor lighting. Clear any walking paths of anything that might make someone trip, such as rocks or tools. Regularly check to see if handrails are loose or broken. Make sure that both sides of any steps have handrails. Any raised decks and porches should have guardrails on the edges. Have any leaves, snow, or ice cleared regularly. Use sand or salt on walking paths during winter. Clean up any spills in your garage right away. This includes oil or grease spills. What can I do in the bathroom? Use night lights. Install grab bars by the toilet and in the tub and shower. Do not use towel bars as grab bars. Use non-skid mats or decals in the tub or shower. If you need to sit down in the shower, use a plastic, non-slip stool. Keep the floor dry. Clean up any water that spills on the floor as soon as it happens. Remove soap buildup in the tub or shower regularly. Attach bath mats securely with double-sided non-slip rug tape. Do not have throw rugs and other things on the floor that can make  you trip. What can I do in the bedroom? Use night lights. Make sure that you have a light by your bed that is easy to reach. Do not use any sheets or blankets that are too big for your bed. They should not hang down onto the floor. Have a firm chair that has side arms. You can use this for support while you get dressed. Do not have throw rugs and other things on the floor that can make you trip. What can I do in the kitchen? Clean up any spills right away. Avoid walking on wet floors. Keep items that you use a lot in easy-to-reach places. If you need to reach something above you, use a strong step stool that has a grab bar. Keep electrical cords out of the way. Do  not use floor polish or wax that makes floors slippery. If you must use wax, use non-skid floor wax. Do not have throw rugs and other things on the floor that can make you trip. What can I do with my stairs? Do not leave any items on the stairs. Make sure that there are handrails on both sides of the stairs and use them. Fix handrails that are broken or loose. Make sure that handrails are as long as the stairways. Check any carpeting to make sure that it is firmly attached to the stairs. Fix any carpet that is loose or worn. Avoid having throw rugs at the top or bottom of the stairs. If you do have throw rugs, attach them to the floor with carpet tape. Make sure that you have a light switch at the top of the stairs and the bottom of the stairs. If you do not have them, ask someone to add them for you. What else can I do to help prevent falls? Wear shoes that: Do not have high heels. Have rubber bottoms. Are comfortable and fit you well. Are closed at the toe. Do not wear sandals. If you use a stepladder: Make sure that it is fully opened. Do not climb a closed stepladder. Make sure that both sides of the stepladder are locked into place. Ask someone to hold it for you, if possible. Clearly mark and make sure that you can  see: Any grab bars or handrails. First and last steps. Where the edge of each step is. Use tools that help you move around (mobility aids) if they are needed. These include: Canes. Walkers. Scooters. Crutches. Turn on the lights when you go into a dark area. Replace any light bulbs as soon as they burn out. Set up your furniture so you have a clear path. Avoid moving your furniture around. If any of your floors are uneven, fix them. If there are any pets around you, be aware of where they are. Review your medicines with your doctor. Some medicines can make you feel dizzy. This can increase your chance of falling. Ask your doctor what other things that you can do to help prevent falls. This information is not intended to replace advice given to you by your health care provider. Make sure you discuss any questions you have with your health care provider. Document Released: 08/31/2009 Document Revised: 04/11/2016 Document Reviewed: 12/09/2014 Elsevier Interactive Patient Education  2017 [employer].

## 2023-05-12 NOTE — Progress Notes (Addendum)
Subjective:   Alejandra Little is a 67 y.o. female who presents for Medicare Annual (Subsequent) preventive examination.  Visit Complete: Virtual  I connected with  Alejandra Little on 05/12/23 by a audio enabled telemedicine application and verified that I am speaking with the correct person using two identifiers.  Patient Location: Home  Provider Location: Office/Clinic  I discussed the limitations of evaluation and management by telemedicine. The patient expressed understanding and agreed to proceed.  Patient Medicare AWV questionnaire was completed by the patient on (not done) ; I have confirmed that all information answered by patient is correct and no changes since this date.  Review of Systems    Cardiac Risk Factors include: advanced age (>87men, >4 women);obesity (BMI >30kg/m2);sedentary lifestyle;dyslipidemia    Objective:    Today's Vitals   05/12/23 1355 05/12/23 1400  Weight: 202 lb (91.6 kg)   Height: 5\' 4"  (1.626 m)   PainSc:  6    Body mass index is 34.67 kg/m.     05/12/2023    2:09 PM 08/17/2020    8:57 AM 05/08/2020    9:54 AM 12/24/2019    3:13 PM 05/05/2019    9:53 AM 04/30/2018    9:55 AM 03/18/2017   10:40 AM  Advanced Directives  Does Patient Have a Medical Advance Directive? No No No No No No No  Would patient like information on creating a medical advance directive?  No - Patient declined No - Patient declined No - Patient declined No - Patient declined  No - Patient declined    Current Medications (verified) Outpatient Encounter Medications as of 05/12/2023  Medication Sig   ALPRAZolam (XANAX) 0.5 MG tablet TAKE 1-2 TABLETS (0.5-1 MG TOTAL) BY MOUTH AT BEDTIME.   cyanocobalamin (VITAMIN B12) 1000 MCG tablet Take 1,000 mcg by mouth daily.   esomeprazole (NEXIUM) 40 MG capsule TAKE 1 CAPSULE BY MOUTH EVERY DAY   FLUoxetine (PROZAC) 20 MG capsule TAKE 3 CAPSULES BY MOUTH EVERY DAY   gabapentin (NEURONTIN) 300 MG capsule TAKE 1 CAPSULE (300MG ) BY MOUTH  THREE TIMES DAILY AND TAKE 2 CAPSULES (600MG ) BY MOUTH AT BEDTIME   HYDROcodone-acetaminophen (NORCO) 7.5-325 MG tablet Take 1 tablet by mouth 4 (four) times daily as needed for moderate pain.   meclizine (ANTIVERT) 25 MG tablet Take 1 tablet (25 mg total) by mouth 3 (three) times daily as needed for dizziness.   meclizine (ANTIVERT) 25 MG tablet Take 1 tablet (25 mg total) by mouth 3 (three) times daily as needed for dizziness.   pravastatin (PRAVACHOL) 40 MG tablet TAKE 1 TABLET BY MOUTH EVERYDAY AT BEDTIME   Specialty Vitamins Products ([facility] HAIR/SKIN/NAILS) TABS Take 1 tablet by mouth daily.    No facility-administered encounter medications on file as of 05/12/2023.    Allergies (verified) Dexilant [dexlansoprazole]   History: Past Medical History:  Diagnosis Date   Anxiety    Arthritis    Chronic back pain    Colon polyp 04/05/2015   Colon polyps    Dog tapeworm infection 01/11/2016   GERD (gastroesophageal reflux disease)    Giardia 01/11/2016   Hyperlipidemia    Sleep apnea    Past Surgical History:  Procedure Laterality Date   ABDOMINAL HYSTERECTOMY  11/18/1997   Fibroids   CHOLECYSTECTOMY  11/18/2002   COLONOSCOPY WITH PROPOFOL N/A 08/17/2020   Procedure: COLONOSCOPY WITH PROPOFOL;  Surgeon: Pasty Spillers, MD;  Location: [facility];  Service: Gastroenterology;  Laterality: N/A;   LITHOTRIPSY  REDUCTION MAMMAPLASTY     2008   TOTAL HIP ARTHROPLASTY Left 10/2021   Dr. Odis Luster   Family History  Problem Relation Age of Onset   Lung cancer Mother    Lung cancer Father    Hypertension Sister    COPD Sister    COPD Sister    Breast cancer Paternal Aunt    Social History   Socioeconomic History   Marital status: Divorced    Spouse name: Not on file   Number of children: 3   Years of education: 8   Highest education level: 8th grade  Occupational History   Occupation: Disability  Tobacco Use   Smoking status: Former    Packs/day: 1.00    Years:  24.00    Additional pack years: 0.00    Total pack years: 24.00    Types: Cigarettes    Start date: 24    Quit date: 11/08/2015    Years since quitting: 7.5   Smokeless tobacco: Never  Vaping Use   Vaping Use: Never used  Substance and Sexual Activity   Alcohol use: No   Drug use: Never   Sexual activity: Yes  Other Topics Concern   Not on file  Social History Narrative   Not on file   Social Determinants of Health   Financial Resource Strain: Low Risk  (05/12/2023)   Overall Financial Resource Strain (CARDIA)    Difficulty of Paying Living Expenses: Not hard at all  Food Insecurity: No Food Insecurity (05/12/2023)   Hunger Vital Sign    Worried About Running Out of Food in the Last Year: Never true    Ran Out of Food in the Last Year: Never true  Transportation Needs: No Transportation Needs (05/12/2023)   PRAPARE - Administrator, Civil Service (Medical): No    Lack of Transportation (Non-Medical): No  Physical Activity: Insufficiently Active (05/12/2023)   Exercise Vital Sign    Days of Exercise per Week: 3 days    Minutes of Exercise per Session: 20 min  Stress: No Stress Concern Present (05/12/2023)   [employer] of Occupational Health - Occupational Stress Questionnaire    Feeling of Stress : Not at all  Social Connections: Socially Isolated (05/12/2023)   Social Connection and Isolation Panel [NHANES]    Frequency of Communication with Friends and Family: More than three times a week    Frequency of Social Gatherings with Friends and Family: More than three times a week    Attends Religious Services: Never    Database administrator or Organizations: No    Attends Engineer, structural: Never    Marital Status: Divorced    Tobacco Counseling Counseling given: Not Answered   Clinical Intake:  Pre-visit preparation completed: Yes  Pain : 0-10 Pain Score: 6  Pain Type: Chronic pain Pain Location: Back Pain Orientation: Lower Pain  Descriptors / Indicators: Aching Pain Onset: More than a month ago Pain Frequency: Intermittent Pain Relieving Factors: medications  Pain Relieving Factors: medications  BMI - recorded: 34.67 Nutritional Status: BMI > 30  Obese Nutritional Risks: None Diabetes: No  How often do you need to have someone help you when you read instructions, pamphlets, or other written materials from your doctor or pharmacy?: 1 - Never  Interpreter Needed?: No  Comments: lives alone Information entered by :: B.Avagail Whittlesey,LPN   Activities of Daily Living    05/12/2023    2:09 PM 05/06/2023   11:04 AM  In your  present state of health, do you have any difficulty performing the following activities:  Hearing? 0 0  Vision? 0 0  Difficulty concentrating or making decisions? 0 0  Walking or climbing stairs? 0 0  Dressing or bathing? 0 0  Doing errands, shopping? 0 0  Preparing Food and eating ? N   Using the Toilet? N   In the past six months, have you accidently leaked urine? N   Do you have problems with loss of bowel control? N   Managing your Medications? N   Managing your Finances? N   Housekeeping or managing your Housekeeping? N     Patient Care Team: Malva Limes, MD as PCP - General (Family Medicine) Rubin Payor as Physician Assistant (Orthopedic Surgery) Lyndle Herrlich, MD as Consulting Physician (Orthopedic Surgery)  Indicate any recent Medical Services you may have received from other than [facility] providers in the past year (date may be approximate).     Assessment:   This is a routine wellness examination for Alejandra Little.  Hearing/Vision screen Hearing Screening - Comments:: Adequate hearing Vision Screening - Comments:: Adequate vision with glasses:lft eye blurry Dr Marti Sleigh   Dietary issues and exercise activities discussed:     Goals Addressed             This Visit's Progress    Cut out extra servings   On track    Recommend to cut out all junk food when  snacking and replace with fruit and vegetables.      Increase water intake   On track    Recommend increasing water intake to 3 glasses a day.     LIFESTYLE - DECREASE FALLS RISK   On track    Recommend to remove any items from the home that may cause slips or trips.     RNCM: Effective Management of Pain   On track    Care Coordination Interventions: 07-09-2022: The patient is rating her back pain at a 10 when she gets up. Having spasms in her back.  Reviewed provider established plan for pain management. 07-09-2022: The patient has pain medications but states that the pain meds are not helping her. She thinks she may have done this last week when she was weed eating. She says this generally happens 2 times a year. 07-09-2022 at 238 pm: Call made back to the patient and offered the patient assistance in calling the office to get an appointment with Dr. Sherrie Mustache. The patient states that she is going to "wait it out" and see if it gets better. Education and support given. The number to the office is [phone]. Discussed importance of adherence to all scheduled medical appointments. Saw the pcp on 05-08-2022. A message has been sent to the pcp for recommendations Counseled on the importance of reporting any/all new or changed pain symptoms or management strategies to pain management provider Advised patient to report to care team affect of pain on daily activities. 07-09-2022: The patient states she has not been able to do any of her usual activities due to the pain in her lower back.  Discussed use of relaxation techniques and/or diversional activities to assist with pain reduction (distraction, imagery, relaxation, massage, acupressure, TENS, heat, and cold application. 07-09-2022: The patient states the only relief she has had was standing in the shower and allowing the hottest water she can to hit her back where it hurts. Education on using a heating pad with 20 minutes of heat on and 20 minutes  of heat  off. The patient is going to try this. Education on making sure that she does not burn herself and be mindful of the area that she is applying the heat to.  Reviewed with patient prescribed pharmacological and nonpharmacological pain relief strategies. 07-09-2022: The patient has pain medications but it is not helping. The RNCM has reached out to the pcp for recommendations with patients expressed needs.  Collaboration with the pcp, Dr. Sherrie Mustache concerning the patient having an acute exacerbation of her back pain and discomfort. She is asking for recommendations. Secure chat with Dr. Sherrie Mustache for recommendations for the patient.  Advised patient to discuss unresolved pain, changes in level and intensity of pain, plan of care for effective pain management with provider Screening for signs and symptoms of depression related to chronic disease state  Assessed social determinant of health barriers AWV completed on 05-08-2022   -took herself off of morphine but still experiencing back pain. -pending injection with [facility]..received cortisone for knee Released from pain clinic/received back injections in the past/reports not being a candidate for surgery -increased fatigue and decreased activity tolerance No falls Increased drowsiness d/t norco Aware of what needs to do to manage pain Plans to do use get heating pad Using heating gel December for f/u related to hip pain.f/u from hip surgery Using pillow bw knee Hip replacement last December..will likely get another cortisone. Pain tends to increase during cooler weather. -problems with pharmacy carrying prescribed Norco..[facility]. Last prescription filled at [facility] Will call next week if medication is not available and order is needed for a different pharmacy.           Depression Screen    05/12/2023    2:07 PM 05/06/2023   11:04 AM 02/28/2023    1:16 PM 12/02/2022   10:41 AM 05/08/2022   10:09 AM 10/29/2021    9:41 AM  06/05/2021    9:31 AM  PHQ 2/9 Scores  PHQ - 2 Score 2 6 6 4 3 6 1   PHQ- 9 Score  10 9 10 6 10 2     Fall Risk    05/12/2023    2:03 PM 05/06/2023   11:04 AM 02/28/2023    1:16 PM 12/02/2022   10:41 AM 09/11/2022    9:51 AM  Fall Risk   Falls in the past year? 0 0 0 0 0  Number falls in past yr: 0 0 0 0 0  Injury with Fall? 0 0 0 0   Risk for fall due to : No Fall Risks No Fall Risks  No Fall Risks Medication side effect;Other (Comment);Orthopedic patient  Risk for fall due to: Comment     Reports episodes of back and knee pain/Hx of Vertigo  Follow up Education provided;Falls prevention discussed Falls evaluation completed  Falls evaluation completed Falls prevention discussed    MEDICARE RISK AT HOME:  Medicare Risk at Home - 05/12/23 1403     Any stairs in or around the home? Yes    If so, are there any without handrails? Yes    Home free of loose throw rugs in walkways, pet beds, electrical cords, etc? Yes    Adequate lighting in your home to reduce risk of falls? Yes    Life alert? No    Use of a cane, walker or w/c? No    Grab bars in the bathroom? No    Shower chair or bench in shower? No    Elevated toilet  seat or a handicapped toilet? No             TIMED UP AND GO:  Was the test performed?  No    Cognitive Function:        05/12/2023    2:11 PM 05/08/2020   10:03 AM 05/05/2019    9:58 AM 04/30/2018   10:00 AM 03/18/2017   10:46 AM  6CIT Screen  What Year? 0 points 0 points 0 points 0 points 0 points  What month? 0 points 0 points 0 points 3 points 0 points  What time? 0 points 0 points 0 points 0 points 0 points  Count back from 20 0 points 0 points 0 points 0 points 0 points  Months in reverse 0 points 0 points 0 points 0 points 0 points  Repeat phrase 0 points 0 points 0 points 0 points 0 points  Total Score 0 points 0 points 0 points 3 points 0 points    Immunizations Immunization History  Administered Date(s) Administered   Covid-19,  Mrna,Vaccine(Spikevax)60yrs and older 08/09/2022   Fluad Quad(high Dose 65+) 08/09/2022   Influenza Split 08/03/2012   Influenza, High Dose Seasonal PF 09/30/2020, 09/06/2021   Influenza,inj,Quad PF,6+ Mos 08/23/2013, 09/05/2014, 09/07/2015, 09/03/2018   Influenza-Unspecified 09/23/2017, 08/26/2019   PFIZER(Purple Top)SARS-COV-2 Vaccination 01/02/2020, 02/01/2020   PNEUMOCOCCAL CONJUGATE-20 09/06/2021   Pneumococcal Conjugate-13 09/30/2020   Respiratory Syncytial Virus Vaccine,Recomb Aduvanted(Arexvy) 09/29/2022   Tdap 09/07/2015    TDAP status: Up to date  Flu Vaccine status: Up to date  Pneumococcal vaccine status: Up to date  Covid-19 vaccine status: Completed vaccines  Qualifies for Shingles Vaccine? Yes   Zostavax completed No   Shingrix Completed?: No.    Education has been provided regarding the importance of this vaccine. Patient has been advised to call insurance company to determine out of pocket expense if they have not yet received this vaccine. Advised may also receive vaccine at local pharmacy or Health Dept. Verbalized acceptance and understanding.  Screening Tests Health Maintenance  Topic Date Due   Lung Cancer Screening  Never done   Zoster Vaccines- Shingrix (1 of 2) Never done   INFLUENZA VACCINE  06/19/2023   Medicare Annual Wellness (AWV)  05/11/2024   MAMMOGRAM  07/18/2024   DEXA SCAN  07/18/2025   Colonoscopy  08/17/2025   DTaP/Tdap/Td (2 - Td or Tdap) 09/06/2025   Pneumonia Vaccine 31+ Years old  Completed   COVID-19 Vaccine  Completed   Hepatitis C Screening  Completed   HPV VACCINES  Aged Out    Health Maintenance  Health Maintenance Due  Topic Date Due   Lung Cancer Screening  Never done   Zoster Vaccines- Shingrix (1 of 2) Never done    Colorectal cancer screening: Type of screening: Colonoscopy. Completed yes. Repeat every 5 years  Mammogram status: Completed yes. Repeat every year  Bone Density status: Completed yes. Results  reflect: Bone density results: NORMAL. Repeat every 5 years.  Lung Cancer Screening: (Low Dose CT Chest recommended if Age 19-80 years, 20 pack-year currently smoking OR have quit w/in 15years.) does qualify.   Lung Cancer Screening Referral: no  Additional Screening:  Hepatitis C Screening: does not qualify; Completed yes  Vision Screening: Recommended annual ophthalmology exams for early detection of glaucoma and other disorders of the eye. Is the patient up to date with their annual eye exam?  Yes  Who is the provider or what is the name of the office in which the patient attends  annual eye exams? Dr Marti Sleigh If pt is not established with a provider, would they like to be referred to a provider to establish care? No .   Dental Screening: Recommended annual dental exams for proper oral hygiene  Diabetic Foot Exam: yes  Community Resource Referral / Chronic Care Management: CRR required this visit?  No   CCM required this visit?  No    Plan:     I have personally reviewed and noted the following in the patient's chart:   Medical and social history Use of alcohol, tobacco or illicit drugs  Current medications and supplements including opioid prescriptions. Patient is currently taking opioid prescriptions. Information provided to patient regarding non-opioid alternatives. Patient advised to discuss non-opioid treatment plan with their provider. Functional ability and status Nutritional status Physical activity Advanced directives List of other physicians Hospitalizations, surgeries, and ER visits in previous 12 months Vitals Screenings to include cognitive, depression, and falls Referrals and appointments  In addition, I have reviewed and discussed with patient certain preventive protocols, quality metrics, and best practice recommendations. A written personalized care plan for preventive services as well as general preventive health recommendations were provided to  patient.     Sue Lush, LPN   [id number]/[id number]   After Visit Summary: (MyChart) Due to this being a telephonic visit, the after visit summary with patients personalized plan was offered to patient via MyChart   Nurse Notes: The patient states she is doing good and has no concerns or questions at this time.    I have reviewed the health advisor's note, was available for consultation, and agree with documentation and plan  Despina Arias [facility] 05/14/2023 9:27 PM

## 2023-05-26 ENCOUNTER — Other Ambulatory Visit: Payer: Self-pay | Admitting: Family Medicine

## 2023-05-26 ENCOUNTER — Telehealth: Payer: Self-pay | Admitting: Family Medicine

## 2023-05-26 NOTE — Telephone Encounter (Addendum)
Pt stated she received a letter from [facility] regarding her medication, NEXIUM, wanting to switch her to an alternative. Pt stated that this is the only medication that helps her.  Stated was advised by [facility] has one more refill, then will need an appeal and PA.  [facility] -  [phone]   Please advise.

## 2023-05-26 NOTE — Telephone Encounter (Signed)
Requested medications are due for refill today.  yes  Requested medications are on the active medications list.  yes  Last refill. 04/25/2023 #120 0 rf  Future visit scheduled.   yes  Notes to clinic.  Refill not delegated.    Requested Prescriptions  Pending Prescriptions Disp Refills   HYDROcodone-acetaminophen (NORCO) 7.5-325 MG tablet 120 tablet 0    Sig: Take 1 tablet by mouth 4 (four) times daily as needed for moderate pain.     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   [place] [facility] Malva Limes, MD   2 months ago Breast tenderness in female   [facility] Malva Limes, MD   5 months ago Pure hypercholesterolemia   [place] [facility] Malva Limes, MD   1 year ago Medicare annual wellness visit, subsequent   [place] [facility] Malva Limes, MD   1 year ago 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

## 2023-05-26 NOTE — Telephone Encounter (Signed)
Medication Refill - Medication: HYDROcodone-acetaminophen (NORCO) 7.5-325 MG tablet   Has the patient contacted their pharmacy? No. No, more refills.  (Agent: If no, request that the patient contact the pharmacy for the refill. If patient does not wish to contact the pharmacy document the reason why and proceed with request.)   Preferred Pharmacy (with phone number or street name):  [id number] Nich[place] Rough, [place] - [address] AT [facility] OF [address]  [address] [place] [address]  Phone: [phone] Fax: [phone]  Hours: Not open 24 hours   Has the patient been seen for an appointment in the last year OR does the patient have an upcoming appointment? Yes.    Agent: Please be advised that RX refills may take up to 3 business days. We ask that you follow-up with your pharmacy.

## 2023-05-27 MED ORDER — HYDROCODONE-ACETAMINOPHEN 7.5-325 MG PO TABS: 1.0000 | ORAL_TABLET | Freq: Four times a day (QID) | ORAL | 0 refills | Status: DC | PRN

## 2023-05-28 DIAGNOSIS — H40013 Open angle with borderline findings, low risk, bilateral: Secondary | ICD-10-CM | POA: Diagnosis not present

## 2023-06-13 DIAGNOSIS — H538 Other visual disturbances: Secondary | ICD-10-CM | POA: Diagnosis not present

## 2023-06-13 DIAGNOSIS — H2513 Age-related nuclear cataract, bilateral: Secondary | ICD-10-CM | POA: Diagnosis not present

## 2023-06-13 DIAGNOSIS — H04123 Dry eye syndrome of bilateral lacrimal glands: Secondary | ICD-10-CM | POA: Diagnosis not present

## 2023-06-13 DIAGNOSIS — H43821 Vitreomacular adhesion, right eye: Secondary | ICD-10-CM | POA: Diagnosis not present

## 2023-06-13 DIAGNOSIS — H43822 Vitreomacular adhesion, left eye: Secondary | ICD-10-CM | POA: Diagnosis not present

## 2023-06-16 ENCOUNTER — Encounter: Payer: Self-pay | Admitting: Ophthalmology

## 2023-06-16 DIAGNOSIS — H2512 Age-related nuclear cataract, left eye: Secondary | ICD-10-CM | POA: Diagnosis not present

## 2023-06-16 NOTE — Anesthesia Preprocedure Evaluation (Signed)
Anesthesia Evaluation  Patient identified by MRN, date of birth, ID band Patient awake    Reviewed: Allergy & Precautions, H&P , NPO status , Patient's Chart, lab work & pertinent test results  Airway Mallampati: III  TM Distance: <3 FB Neck ROM: Full    Dental  (+) Caps Upper central incisor cap:   Pulmonary neg pulmonary ROS, sleep apnea , former smoker   Pulmonary exam normal breath sounds clear to auscultation       Cardiovascular negative cardio ROS Normal cardiovascular exam Rhythm:Regular Rate:Normal     Neuro/Psych  PSYCHIATRIC DISORDERS Anxiety      Neuromuscular disease negative neurological ROS  negative psych ROS   GI/Hepatic negative GI ROS, Neg liver ROS,GERD  ,,  Endo/Other  negative endocrine ROS    Renal/GU negative Renal ROS  negative genitourinary   Musculoskeletal negative musculoskeletal ROS (+) Arthritis ,    Abdominal   Peds negative pediatric ROS (+)  Hematology negative hematology ROS (+)   Anesthesia Other Findings Anxiety  GERD (gastroesophageal reflux disease) Hyperlipidemia  Giardia Dog tapeworm infection  Arthritis Chronic back pain  Colon polyps Sleep apnea On CPAP    Reproductive/Obstetrics negative OB ROS                              Anesthesia Physical Anesthesia Plan  ASA: 2  Anesthesia Plan: MAC   Post-op Pain Management:    Induction: Intravenous  PONV Risk Score and Plan:   Airway Management Planned: Natural Airway and Nasal Cannula  Additional Equipment:   Intra-op Plan:   Post-operative Plan:   Informed Consent: I have reviewed the patients History and Physical, chart, labs and discussed the procedure including the risks, benefits and alternatives for the proposed anesthesia with the patient or authorized representative who has indicated his/her understanding and acceptance.     Dental Advisory Given  Plan Discussed  with: Anesthesiologist, CRNA and Surgeon  Anesthesia Plan Comments: (Patient consented for risks of anesthesia including but not limited to:  - adverse reactions to medications - damage to eyes, teeth, lips or other oral mucosa - nerve damage due to positioning  - sore throat or hoarseness - Damage to heart, brain, nerves, lungs, other parts of body or loss of life  Patient voiced understanding.)         Anesthesia Quick Evaluation

## 2023-06-17 NOTE — Discharge Instructions (Signed)

## 2023-06-19 ENCOUNTER — Encounter: Payer: Self-pay | Admitting: Ophthalmology

## 2023-06-19 ENCOUNTER — Ambulatory Visit: Payer: Medicare Other | Admitting: Anesthesiology

## 2023-06-19 ENCOUNTER — Encounter
Admission: RE | Disposition: A | Discharge status: Home / Self Care | Payer: Self-pay | Source: Home / Self Care | Attending: Ophthalmology

## 2023-06-19 ENCOUNTER — Ambulatory Visit
Admission: RE | Admit: 2023-06-19 | Discharge: 2023-06-19 | Disposition: A | Discharge status: Home / Self Care | Payer: Medicare Other | Attending: Ophthalmology | Admitting: Ophthalmology

## 2023-06-19 ENCOUNTER — Other Ambulatory Visit: Payer: Self-pay

## 2023-06-19 ENCOUNTER — Ambulatory Visit: Payer: Self-pay | Admitting: Anesthesiology

## 2023-06-19 DIAGNOSIS — H2512 Age-related nuclear cataract, left eye: Secondary | ICD-10-CM | POA: Diagnosis not present

## 2023-06-19 DIAGNOSIS — H2511 Age-related nuclear cataract, right eye: Secondary | ICD-10-CM | POA: Diagnosis not present

## 2023-06-19 DIAGNOSIS — Z87891 Personal history of nicotine dependence: Secondary | ICD-10-CM | POA: Diagnosis not present

## 2023-06-19 HISTORY — DX: Obstructive sleep apnea (adult) (pediatric): G47.33

## 2023-06-19 HISTORY — PX: CATARACT EXTRACTION W/PHACO: SHX586

## 2023-06-19 SURGERY — PHACOEMULSIFICATION, CATARACT, WITH IOL INSERTION
Anesthesia: Monitor Anesthesia Care | Site: Eye | Laterality: Left

## 2023-06-19 MED ORDER — MOXIFLOXACIN HCL 0.5 % OP SOLN
OPHTHALMIC | Status: DC | PRN
  Administered 2023-06-19: .2 mL via OPHTHALMIC

## 2023-06-19 MED ORDER — FENTANYL CITRATE (PF) 100 MCG/2ML IJ SOLN
INTRAMUSCULAR | Status: DC | PRN
  Administered 2023-06-19: 50 ug via INTRAVENOUS

## 2023-06-19 MED ORDER — BRIMONIDINE TARTRATE-TIMOLOL 0.2-0.5 % OP SOLN
OPHTHALMIC | Status: DC | PRN
  Administered 2023-06-19: 1 [drp] via OPHTHALMIC

## 2023-06-19 MED ORDER — TETRACAINE HCL 0.5 % OP SOLN
1.0000 [drp] | OPHTHALMIC | Status: DC | PRN
  Administered 2023-06-19 (×3): 1 [drp] via OPHTHALMIC

## 2023-06-19 MED ORDER — LIDOCAINE HCL (PF) 2 % IJ SOLN
INTRAOCULAR | Status: DC | PRN
  Administered 2023-06-19: 4 mL via INTRAOCULAR

## 2023-06-19 MED ORDER — ARMC OPHTHALMIC DILATING DROPS
1.0000 | OPHTHALMIC | Status: DC | PRN
  Administered 2023-06-19 (×3): 1 via OPHTHALMIC

## 2023-06-19 MED ORDER — SIGHTPATH DOSE#1 BSS IO SOLN
INTRAOCULAR | Status: DC | PRN
  Administered 2023-06-19: 15 mL via INTRAOCULAR

## 2023-06-19 MED ORDER — SIGHTPATH DOSE#1 NA HYALUR & NA CHOND-NA HYALUR IO KIT
PACK | INTRAOCULAR | Status: DC | PRN
  Administered 2023-06-19: 1 via OPHTHALMIC

## 2023-06-19 MED ORDER — SIGHTPATH DOSE#1 BSS IO SOLN
INTRAOCULAR | Status: DC | PRN
  Administered 2023-06-19: 75 mL via OPHTHALMIC

## 2023-06-19 MED ORDER — LACTATED RINGERS IV SOLN: INTRAVENOUS | Status: DC

## 2023-06-19 MED ORDER — MIDAZOLAM HCL 2 MG/2ML IJ SOLN
INTRAMUSCULAR | Status: DC | PRN
  Administered 2023-06-19: 2 mg via INTRAVENOUS

## 2023-06-19 SURGICAL SUPPLY — 11 items
CANNULA ANT/CHMB 27G (MISCELLANEOUS) IMPLANT
CANNULA ANT/CHMB 27GA (MISCELLANEOUS)
CATARACT SUITE SIGHTPATH (MISCELLANEOUS) ×1
DISSECTOR HYDRO NUCLEUS 50X22 (MISCELLANEOUS) ×1 IMPLANT
DRSG TEGADERM 2-3/8X2-3/4 SM (GAUZE/BANDAGES/DRESSINGS) ×1 IMPLANT
FEE CATARACT SUITE SIGHTPATH (MISCELLANEOUS) ×1 IMPLANT
GLOVE SURG SYN 7.5 E (GLOVE) ×1
GLOVE SURG SYN 7.5 PF PI (GLOVE) ×1 IMPLANT
GLOVE SURG SYN 8.5 E (GLOVE) ×1
GLOVE SURG SYN 8.5 PF PI (GLOVE) ×1 IMPLANT
LENS IOL TECNIS EYHANCE 23.0 (Intraocular Lens) IMPLANT

## 2023-06-19 NOTE — Transfer of Care (Signed)
Immediate Anesthesia Transfer of Care Note  Patient: Alejandra Little  Procedure(s) Performed: CATARACT EXTRACTION PHACO AND INTRAOCULAR LENS PLACEMENT (IOC)  LEFT 8.30 00:58.3 (Left: Eye)  Patient Location: PACU  Anesthesia Type: MAC  Level of Consciousness: awake, alert  and patient cooperative  Airway and Oxygen Therapy: Patient Spontanous Breathing and Patient connected to supplemental oxygen  Post-op Assessment: Post-op Vital signs reviewed, Patient's Cardiovascular Status Stable, Respiratory Function Stable, Patent Airway and No signs of Nausea or vomiting  Post-op Vital Signs: Reviewed and stable  Complications: No notable events documented.

## 2023-06-19 NOTE — Anesthesia Postprocedure Evaluation (Signed)
Anesthesia Post Note  Patient: Alejandra Little  Procedure(s) Performed: CATARACT EXTRACTION PHACO AND INTRAOCULAR LENS PLACEMENT (IOC)  LEFT 8.30 00:58.3 (Left: Eye)  Patient location during evaluation: PACU Anesthesia Type: MAC Level of consciousness: awake and alert Pain management: pain level controlled Vital Signs Assessment: post-procedure vital signs reviewed and stable Respiratory status: spontaneous breathing, nonlabored ventilation, respiratory function stable and patient connected to nasal cannula oxygen Cardiovascular status: stable and blood pressure returned to baseline Postop Assessment: no apparent nausea or vomiting Anesthetic complications: no   No notable events documented.   Last Vitals:  Vitals:   06/19/23 0852 06/19/23 0857  BP: 118/78 116/70  Pulse: 62 68  Resp: 13 12  Temp: (!) 36.1 C (!) 36.1 C  SpO2: 94% 95%    Last Pain:  Vitals:   06/19/23 0857  PainSc: 0-No pain                 Travis Purk C [place]

## 2023-06-19 NOTE — Op Note (Signed)
OPERATIVE NOTE  Alejandra Little [id number] 06/19/2023   PREOPERATIVE DIAGNOSIS: Nuclear sclerotic cataract left eye. H25.12   POSTOPERATIVE DIAGNOSIS: Nuclear sclerotic cataract left eye. H25.12   PROCEDURE:  Phacoemusification with posterior chamber intraocular lens placement of the left eye  Ultrasound time: Procedure(s): CATARACT EXTRACTION PHACO AND INTRAOCULAR LENS PLACEMENT (IOC)  LEFT 8.30 00:58.3 (Left)  LENS:   Implant Name Type Inv. Item Serial No. Manufacturer Lot No. LRB No. Used Action  LENS IOL TECNIS EYHANCE 23.0 - [id number] Intraocular Lens LENS IOL TECNIS EYHANCE 23.0 [id number] [employer]  Left 1 Implanted      SURGEON:  Julious Payer. Rolley Sims, MD   ANESTHESIA:  Topical with tetracaine drops, augmented with 1% preservative-free intracameral lidocaine.   COMPLICATIONS:  None.   DESCRIPTION OF PROCEDURE:  The patient was identified in the holding room and transported to the operating room and placed in the supine position under the operating microscope.  The left eye was identified as the operative eye, which was prepped and draped in the usual sterile ophthalmic fashion.   A 1 millimeter clear-corneal paracentesis was made inferotemporally. Preservative-free 1% lidocaine mixed with 1:1,000 bisulfite-free aqueous solution of epinephrine was injected into the anterior chamber. The anterior chamber was then filled with Viscoat viscoelastic. A 2.4 millimeter keratome was used to make a clear-corneal incision superotemporally. A curvilinear capsulorrhexis was made with a cystotome and capsulorrhexis forceps. Balanced salt solution was used to hydrodissect and hydrodelineate the nucleus. Phacoemulsification was then used to remove the lens nucleus and epinucleus. The remaining cortex was then removed using the irrigation and aspiration handpiece. Provisc was then placed into the capsular bag to distend it for lens placement. A +23.00 D DIB00 intraocular lens was then injected into the  capsular bag. The remaining viscoelastic was aspirated.   Wounds were hydrated with balanced salt solution.  The anterior chamber was inflated to a physiologic pressure with balanced salt solution.  No wound leaks were noted. Vigamox was injected intracamerally.  Timolol and Brimonidine drops were applied to the eye.  The patient was taken to the recovery room in stable condition without complications of anesthesia or surgery.  Rolly Pancake Butler 06/19/2023, 8:51 AM

## 2023-06-19 NOTE — H&P (Signed)
[facility]   Primary Care Physician:  Malva Limes, MD Ophthalmologist: Dr. Deberah Pelton  Pre-Procedure History & Physical: HPI:  Alejandra Little is a 67 y.o. female here for cataract surgery.   Past Medical History:  Diagnosis Date   Anxiety    Arthritis    Chronic back pain    Colon polyp 04/05/2015   Colon polyps    Dog tapeworm infection 01/11/2016   GERD (gastroesophageal reflux disease)    Giardia 01/11/2016   Hyperlipidemia    Sleep apnea     Past Surgical History:  Procedure Laterality Date   ABDOMINAL HYSTERECTOMY  11/18/1997   Fibroids   CHOLECYSTECTOMY  11/18/2002   COLONOSCOPY WITH PROPOFOL N/A 08/17/2020   Procedure: COLONOSCOPY WITH PROPOFOL;  Surgeon: Pasty Spillers, MD;  Location: [facility];  Service: Gastroenterology;  Laterality: N/A;   LITHOTRIPSY     REDUCTION MAMMAPLASTY     2008   TOTAL HIP ARTHROPLASTY Left 10/2021   Dr. Odis Luster    Prior to Admission medications   Medication Sig Start Date End Date Taking? Authorizing Provider  ALPRAZolam Prudy Feeler) 0.5 MG tablet TAKE 1-2 TABLETS (0.5-1 MG TOTAL) BY MOUTH AT BEDTIME. 04/29/23  Yes Malva Limes, MD  cyanocobalamin (VITAMIN B12) 1000 MCG tablet Take 1,000 mcg by mouth daily.   Yes [provider]  esomeprazole (NEXIUM) 40 MG capsule TAKE 1 CAPSULE BY MOUTH EVERY DAY 10/07/22  Yes Malva Limes, MD  FLUoxetine (PROZAC) 20 MG capsule TAKE 3 CAPSULES BY MOUTH EVERY DAY 12/17/22  Yes Malva Limes, MD  gabapentin (NEURONTIN) 300 MG capsule TAKE 1 CAPSULE (300MG ) BY MOUTH THREE TIMES DAILY AND TAKE 2 CAPSULES (600MG ) BY MOUTH AT BEDTIME 10/18/22  Yes Malva Limes, MD  HYDROcodone-acetaminophen (NORCO) 7.5-325 MG tablet Take 1 tablet by mouth 4 (four) times daily as needed for moderate pain. 05/27/23  Yes Malva Limes, MD  meclizine (ANTIVERT) 25 MG tablet Take 1 tablet (25 mg total) by mouth 3 (three) times daily as needed for dizziness. 11/08/22  Yes Malva Limes,  MD  meclizine (ANTIVERT) 25 MG tablet Take 1 tablet (25 mg total) by mouth 3 (three) times daily as needed for dizziness. 11/08/22  Yes Malva Limes, MD  pravastatin (PRAVACHOL) 40 MG tablet TAKE 1 TABLET BY MOUTH EVERYDAY AT BEDTIME 12/16/22  Yes Malva Limes, MD  Specialty Vitamins Products ([employer] HAIR/SKIN/NAILS) TABS Take 1 tablet by mouth daily.    Yes [provider]    Allergies as of 06/16/2023 - Review Complete 06/16/2023  Allergen Reaction Noted   Dexilant [dexlansoprazole] Diarrhea 06/23/2020    Family History  Problem Relation Age of Onset   Lung cancer Mother    Lung cancer Father    Hypertension Sister    COPD Sister    COPD Sister    Breast cancer Paternal Aunt     Social History   Socioeconomic History   Marital status: Divorced    Spouse name: Not on file   Number of children: 3   Years of education: 8   Highest education level: 8th grade  Occupational History   Occupation: Disability  Tobacco Use   Smoking status: Former    Current packs/day: 0.00    Average packs/day: 1 pack/day for 27.0 years (27.0 ttl pk-yrs)    Types: Cigarettes    Start date: 5    Quit date: 11/08/2015    Years since quitting: 7.6   Smokeless tobacco: Never  Vaping Use   Vaping status: Never Used  Substance and Sexual Activity   Alcohol use: No   Drug use: Never   Sexual activity: Yes  Other Topics Concern   Not on file  Social History Narrative   Not on file   Social Determinants of Health   Financial Resource Strain: Low Risk  (05/12/2023)   Overall Financial Resource Strain (CARDIA)    Difficulty of Paying Living Expenses: Not hard at all  Food Insecurity: No Food Insecurity (05/12/2023)   Hunger Vital Sign    Worried About Running Out of Food in the Last Year: Never true    Ran Out of Food in the Last Year: Never true  Transportation Needs: No Transportation Needs (05/12/2023)   PRAPARE - Administrator, Civil Service (Medical): No     Lack of Transportation (Non-Medical): No  Physical Activity: Insufficiently Active (05/12/2023)   Exercise Vital Sign    Days of Exercise per Week: 3 days    Minutes of Exercise per Session: 20 min  Stress: No Stress Concern Present (05/12/2023)   [employer] of Occupational Health - Occupational Stress Questionnaire    Feeling of Stress : Not at all  Social Connections: Socially Isolated (05/12/2023)   Social Connection and Isolation Panel [NHANES]    Frequency of Communication with Friends and Family: More than three times a week    Frequency of Social Gatherings with Friends and Family: More than three times a week    Attends Religious Services: Never    Database administrator or Organizations: No    Attends Banker Meetings: Never    Marital Status: Divorced  Catering manager Violence: Not At Risk (05/12/2023)   Humiliation, Afraid, Rape, and Kick questionnaire    Fear of Current or Ex-Partner: No    Emotionally Abused: No    Physically Abused: No    Sexually Abused: No    Review of Systems: See HPI, otherwise negative ROS  Physical Exam: Ht 5\' 4"  (1.626 m)   Wt 89.8 kg   BMI 33.99 kg/m  General:   Alert, cooperative in NAD Head:  Normocephalic and atraumatic. Respiratory:  Normal work of breathing. Cardiovascular:  RRR  Impression/Plan: Alejandra Little is here for cataract surgery.  Risks, benefits, limitations, and alternatives regarding cataract surgery have been reviewed with the patient.  Questions have been answered.  All parties agreeable.   Estanislado Pandy, MD  06/19/2023, 7:12 AM

## 2023-06-20 ENCOUNTER — Encounter: Payer: Self-pay | Admitting: Ophthalmology

## 2023-06-23 ENCOUNTER — Other Ambulatory Visit: Payer: Self-pay | Admitting: *Deleted

## 2023-06-23 DIAGNOSIS — Z122 Encounter for screening for malignant neoplasm of respiratory organs: Secondary | ICD-10-CM

## 2023-06-23 DIAGNOSIS — Z87891 Personal history of nicotine dependence: Secondary | ICD-10-CM

## 2023-06-25 ENCOUNTER — Telehealth: Payer: Self-pay

## 2023-06-25 ENCOUNTER — Other Ambulatory Visit: Payer: Self-pay | Admitting: Family Medicine

## 2023-06-25 NOTE — Telephone Encounter (Signed)
Copied from CRM [id number]. Topic: General - Other >> Jun 25, 2023 12:26 PM Phill Myron wrote: Reason for CRM: Ms Pagdilao would like to know if she could come in a give a urine sample, Question Kidney stones Does not want to see the doctor. Please advise

## 2023-06-25 NOTE — Telephone Encounter (Signed)
Medication Refill - Medication: HYDROcodone-acetaminophen (NORCO) 7.5-325 MG tablet [[id number]]   Has the patient contacted their pharmacy? Yes.    (Agent: If yes, when and what did the pharmacy advise?) Contact office   Preferred Pharmacy (with phone number or street name): [id number] - [place], [place] - [address] AT [address] OF [address]   Has the patient been seen for an appointment in the last year OR does the patient have an upcoming appointment? Yes.    Agent: Please be advised that RX refills may take up to 3 business days. We ask that you follow-up with your pharmacy.

## 2023-06-26 ENCOUNTER — Encounter: Payer: Self-pay | Admitting: Family Medicine

## 2023-06-26 ENCOUNTER — Ambulatory Visit (INDEPENDENT_AMBULATORY_CARE_PROVIDER_SITE_OTHER): Payer: Medicare Other | Admitting: Family Medicine

## 2023-06-26 VITALS — BP 122/61 | HR 66 | Temp 97.6°F | Resp 12 | Ht 64.0 in | Wt 204.6 lb

## 2023-06-26 DIAGNOSIS — R300 Dysuria: Secondary | ICD-10-CM | POA: Diagnosis not present

## 2023-06-26 DIAGNOSIS — R109 Unspecified abdominal pain: Secondary | ICD-10-CM

## 2023-06-26 LAB — POCT URINALYSIS DIPSTICK
Bilirubin, UA: NEGATIVE
Blood, UA: NEGATIVE
Glucose, UA: NEGATIVE
Ketones, UA: NEGATIVE
Nitrite, UA: NEGATIVE
Protein, UA: POSITIVE — AB
Spec Grav, UA: 1.025 (ref 1.010–1.025)
Urobilinogen, UA: 0.2 E.U./dL
pH, UA: 6 (ref 5.0–8.0)

## 2023-06-26 MED ORDER — HYDROCODONE-ACETAMINOPHEN 7.5-325 MG PO TABS: 1.0000 | ORAL_TABLET | Freq: Four times a day (QID) | ORAL | 0 refills | Status: DC | PRN

## 2023-06-26 MED ORDER — CEPHALEXIN 500 MG PO CAPS: 500.0000 mg | ORAL_CAPSULE | Freq: Two times a day (BID) | ORAL | 0 refills | Status: AC

## 2023-06-26 MED ORDER — MELOXICAM 7.5 MG PO TABS: 7.5000 mg | ORAL_TABLET | Freq: Every day | ORAL | 0 refills | Status: DC

## 2023-06-26 NOTE — Telephone Encounter (Signed)
Requested medication (s) are due for refill today: yes  Requested medication (s) are on the active medication list: yes  Last refill:  05/27/23  Future visit scheduled: yes  Notes to clinic:  Unable to refill per protocol, cannot delegate.      Requested Prescriptions  Pending Prescriptions Disp Refills   HYDROcodone-acetaminophen (NORCO) 7.5-325 MG tablet 120 tablet 0    Sig: Take 1 tablet by mouth 4 (four) times daily as needed for moderate pain.     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

## 2023-06-26 NOTE — Progress Notes (Signed)
Acute Office Visit  Subjective:     Patient ID: Alejandra Little, female    DOB: 07-29-1956, 67 y.o.   MRN: [id number]  Chief Complaint  Patient presents with   Urinary Tract Infection    Urinary Tract Infection    Discussed the use of AI scribe software for clinical note transcription with the patient, who gave verbal consent to proceed.  History of Present Illness   The patient presents with a two-week history of right thoracic back pain, which has been progressively worsening. The pain is localized and does not radiate. They have not experienced any recent injuries or overuse that could have triggered the pain. Concurrently, they have noticed an increase in urinary frequency with darker urine, but deny urgency, dysuria, hematuria, or malodorous urine. They have a history of urinary tract infections, but report that the current symptoms are dissimilar. They also report occasional nausea, but deny fevers. The patient has a known history of back problems, including bulging discs, bone deterioration, and severe arthritis, typically affecting the left side. However, they have recently experienced pain on the right side.       ROS per HPI      Objective:    BP 122/61 (BP Location: Left Arm, Patient Position: Sitting, Cuff Size: Large)   Pulse 66   Temp 97.6 F (36.4 C) (Temporal)   Resp 12   Ht 5\' 4"  (1.626 m)   Wt 204 lb 9.6 oz (92.8 kg)   SpO2 98%   BMI 35.12 kg/m    Physical Exam Vitals reviewed.  Constitutional:      General: She is not in acute distress.    Appearance: She is well-developed.  HENT:     Head: Normocephalic and atraumatic.  Eyes:     General: No scleral icterus.    Conjunctiva/sclera: Conjunctivae normal.  Cardiovascular:     Rate and Rhythm: Normal rate and regular rhythm.     Heart sounds: Normal heart sounds. No murmur heard. Pulmonary:     Effort: Pulmonary effort is normal. No respiratory distress.     Breath sounds: Normal breath sounds. No  wheezing or rales.  Abdominal:     General: There is no distension.     Palpations: Abdomen is soft.     Tenderness: There is no abdominal tenderness. There is no right CVA tenderness, left CVA tenderness, guarding or rebound.  Musculoskeletal:     Comments: Back: No midline TTP, ROM grossly intact.  Mild TTP over R thoracic back and laterally    Skin:    General: Skin is warm and dry.     Capillary Refill: Capillary refill takes less than 2 seconds.     Findings: No rash.  Neurological:     Mental Status: She is alert and oriented to person, place, and time.  Psychiatric:        Behavior: Behavior normal.     Results for orders placed or performed in visit on 06/26/23  POCT urinalysis dipstick  Result Value Ref Range   Color, UA yellow    Clarity, UA clear    Glucose, UA Negative Negative   Bilirubin, UA Negative    Ketones, UA Negative    Spec Grav, UA 1.025 1.010 - 1.025   Blood, UA Negative    pH, UA 6.0 5.0 - 8.0   Protein, UA Positive (A) Negative   Urobilinogen, UA 0.2 0.2 or 1.0 E.U./dL   Nitrite, UA Negative    Leukocytes, UA Small (1+) (  A) Negative   Appearance     Odor          Assessment & Plan:   Problem List Items Addressed This Visit   None Visit Diagnoses     Dysuria    -  Primary   Relevant Orders   POCT urinalysis dipstick (Completed)   Urine Culture   Right flank pain               Back Pain New onset of right upper back pain for the past couple of weeks, worsening in intensity. Pain radiates to the groin and is exacerbated by deep breathing. No associated fever or injury. Patient has a history of back trouble with bulging discs, bone deterioration, and arthritis. -Prescribe Meloxicam for pain relief.  Possible Urinary Tract Infection Increased urinary frequency and darker urine. No urgency, dysuria, or hematuria. Urinalysis shows a small amount of white blood cells, suggesting a possible infection. -Prescribe Keflex twice daily for 5  days. -Send urine for culture to confirm infection. If culture is negative, discontinue antibiotic.  Follow-up Await results of urine culture. If back pain does not improve with Meloxicam, patient to notify the clinic.        Meds ordered this encounter  Medications   meloxicam (MOBIC) 7.5 MG tablet    Sig: Take 1 tablet (7.5 mg total) by mouth daily.    Dispense:  30 tablet    Refill:  0   cephALEXin (KEFLEX) 500 MG capsule    Sig: Take 1 capsule (500 mg total) by mouth 2 (two) times daily for 5 days.    Dispense:  10 capsule    Refill:  0    Return if symptoms worsen or fail to improve.  Shirlee Latch, MD

## 2023-06-26 NOTE — Telephone Encounter (Signed)
Patient seen by Dr B in office today

## 2023-07-01 NOTE — Anesthesia Preprocedure Evaluation (Signed)
Anesthesia Evaluation  Patient identified by MRN, date of birth, ID band Patient awake    Reviewed: Allergy & Precautions, H&P , NPO status , Patient's Chart, lab work & pertinent test results  Airway Mallampati: III  TM Distance: <3 FB Neck ROM: Full    Dental no notable dental hx. (+) Caps Upper central incisor cap :   Pulmonary sleep apnea , former smoker   Pulmonary exam normal breath sounds clear to auscultation       Cardiovascular negative cardio ROS Normal cardiovascular exam Rhythm:Regular Rate:Normal     Neuro/Psych  PSYCHIATRIC DISORDERS Anxiety      Neuromuscular disease  negative psych ROS   GI/Hepatic negative GI ROS, Neg liver ROS,GERD  ,,  Endo/Other  negative endocrine ROS    Renal/GU negative Renal ROS  negative genitourinary   Musculoskeletal  (+) Arthritis ,    Abdominal   Peds negative pediatric ROS (+)  Hematology negative hematology ROS (+)   Anesthesia Other Findings Previous cataract surgery 06-19-23  Anxiety  GERD (gastroesophageal reflux disease) Hyperlipidemia  Giardia Dog tapeworm infection  Arthritis Chronic back pain  Colon polyps OSA on CPAP     Reproductive/Obstetrics negative OB ROS                             Anesthesia Physical Anesthesia Plan  ASA: 2  Anesthesia Plan: MAC   Post-op Pain Management:    Induction: Intravenous  PONV Risk Score and Plan:   Airway Management Planned: Natural Airway and Nasal Cannula  Additional Equipment:   Intra-op Plan:   Post-operative Plan:   Informed Consent: I have reviewed the patients History and Physical, chart, labs and discussed the procedure including the risks, benefits and alternatives for the proposed anesthesia with the patient or authorized representative who has indicated his/her understanding and acceptance.     Dental Advisory Given  Plan Discussed with: Anesthesiologist,  CRNA and Surgeon  Anesthesia Plan Comments: (Patient consented for risks of anesthesia including but not limited to:  - adverse reactions to medications - damage to eyes, teeth, lips or other oral mucosa - nerve damage due to positioning  - sore throat or hoarseness - Damage to heart, brain, nerves, lungs, other parts of body or loss of life  Patient voiced understanding.)        Anesthesia Quick Evaluation

## 2023-07-01 NOTE — Discharge Instructions (Signed)

## 2023-07-03 ENCOUNTER — Encounter
Admission: RE | Disposition: A | Discharge status: Home / Self Care | Payer: Self-pay | Source: Home / Self Care | Attending: Ophthalmology

## 2023-07-03 ENCOUNTER — Ambulatory Visit
Admission: RE | Admit: 2023-07-03 | Discharge: 2023-07-03 | Disposition: A | Discharge status: Home / Self Care | Payer: Medicare Other | Attending: Ophthalmology | Admitting: Ophthalmology

## 2023-07-03 ENCOUNTER — Ambulatory Visit: Payer: Medicare Other | Admitting: Anesthesiology

## 2023-07-03 ENCOUNTER — Other Ambulatory Visit: Payer: Self-pay

## 2023-07-03 ENCOUNTER — Encounter: Payer: Self-pay | Admitting: Ophthalmology

## 2023-07-03 DIAGNOSIS — E785 Hyperlipidemia, unspecified: Secondary | ICD-10-CM | POA: Diagnosis not present

## 2023-07-03 DIAGNOSIS — Z87891 Personal history of nicotine dependence: Secondary | ICD-10-CM | POA: Insufficient documentation

## 2023-07-03 DIAGNOSIS — G4730 Sleep apnea, unspecified: Secondary | ICD-10-CM | POA: Diagnosis not present

## 2023-07-03 DIAGNOSIS — G709 Myoneural disorder, unspecified: Secondary | ICD-10-CM | POA: Insufficient documentation

## 2023-07-03 DIAGNOSIS — H2511 Age-related nuclear cataract, right eye: Secondary | ICD-10-CM | POA: Diagnosis not present

## 2023-07-03 DIAGNOSIS — K219 Gastro-esophageal reflux disease without esophagitis: Secondary | ICD-10-CM | POA: Insufficient documentation

## 2023-07-03 DIAGNOSIS — M1990 Unspecified osteoarthritis, unspecified site: Secondary | ICD-10-CM | POA: Diagnosis not present

## 2023-07-03 DIAGNOSIS — F419 Anxiety disorder, unspecified: Secondary | ICD-10-CM | POA: Insufficient documentation

## 2023-07-03 DIAGNOSIS — H269 Unspecified cataract: Secondary | ICD-10-CM | POA: Diagnosis not present

## 2023-07-03 HISTORY — PX: CATARACT EXTRACTION W/PHACO: SHX586

## 2023-07-03 SURGERY — PHACOEMULSIFICATION, CATARACT, WITH IOL INSERTION
Anesthesia: Monitor Anesthesia Care | Site: Eye | Laterality: Right

## 2023-07-03 MED ORDER — LIDOCAINE HCL (PF) 2 % IJ SOLN
INTRAOCULAR | Status: DC | PRN
  Administered 2023-07-03: 1 mL via INTRAOCULAR

## 2023-07-03 MED ORDER — SIGHTPATH DOSE#1 NA HYALUR & NA CHOND-NA HYALUR IO KIT
PACK | INTRAOCULAR | Status: DC | PRN
  Administered 2023-07-03: 1 via OPHTHALMIC

## 2023-07-03 MED ORDER — TETRACAINE HCL 0.5 % OP SOLN
1.0000 [drp] | OPHTHALMIC | Status: DC | PRN
  Administered 2023-07-03 (×3): 1 [drp] via OPHTHALMIC

## 2023-07-03 MED ORDER — ARMC OPHTHALMIC DILATING DROPS
1.0000 | OPHTHALMIC | Status: DC | PRN
  Administered 2023-07-03 (×3): 1 via OPHTHALMIC

## 2023-07-03 MED ORDER — BRIMONIDINE TARTRATE-TIMOLOL 0.2-0.5 % OP SOLN
OPHTHALMIC | Status: DC | PRN
  Administered 2023-07-03: 1 [drp] via OPHTHALMIC

## 2023-07-03 MED ORDER — MOXIFLOXACIN HCL 0.5 % OP SOLN
OPHTHALMIC | Status: DC | PRN
  Administered 2023-07-03: .2 mL via OPHTHALMIC

## 2023-07-03 MED ORDER — SIGHTPATH DOSE#1 BSS IO SOLN
INTRAOCULAR | Status: DC | PRN
  Administered 2023-07-03: 81 mL via OPHTHALMIC

## 2023-07-03 MED ORDER — SIGHTPATH DOSE#1 BSS IO SOLN
INTRAOCULAR | Status: DC | PRN
  Administered 2023-07-03: 15 mL

## 2023-07-03 MED ORDER — FENTANYL CITRATE (PF) 100 MCG/2ML IJ SOLN
INTRAMUSCULAR | Status: DC | PRN
  Administered 2023-07-03: 50 ug via INTRAVENOUS

## 2023-07-03 MED ORDER — MIDAZOLAM HCL 2 MG/2ML IJ SOLN
INTRAMUSCULAR | Status: DC | PRN
  Administered 2023-07-03: 2 mg via INTRAVENOUS

## 2023-07-03 MED ORDER — LACTATED RINGERS IV SOLN: INTRAVENOUS | Status: DC

## 2023-07-03 SURGICAL SUPPLY — 9 items
CATARACT SUITE SIGHTPATH (MISCELLANEOUS) ×1
DISSECTOR HYDRO NUCLEUS 50X22 (MISCELLANEOUS) ×1 IMPLANT
DRSG TEGADERM 2-3/8X2-3/4 SM (GAUZE/BANDAGES/DRESSINGS) ×1 IMPLANT
FEE CATARACT SUITE SIGHTPATH (MISCELLANEOUS) ×1 IMPLANT
GLOVE SURG SYN 7.5 E (GLOVE) ×1
GLOVE SURG SYN 7.5 PF PI (GLOVE) ×1 IMPLANT
GLOVE SURG SYN 8.5 E (GLOVE) ×1
GLOVE SURG SYN 8.5 PF PI (GLOVE) ×1 IMPLANT
LENS IOL TECNIS EYHANCE 23.5 (Intraocular Lens) IMPLANT

## 2023-07-03 NOTE — H&P (Signed)
[facility]   Primary Care Physician:  Malva Limes, MD Ophthalmologist: Dr. Deberah Pelton  Pre-Procedure History & Physical: HPI:  Alejandra Little is a 67 y.o. female here for cataract surgery.   Past Medical History:  Diagnosis Date   Anxiety    Arthritis    Chronic back pain    Colon polyp 04/05/2015   Colon polyps    Dog tapeworm infection 01/11/2016   GERD (gastroesophageal reflux disease)    Giardia 01/11/2016   Hyperlipidemia    OSA on CPAP    Sleep apnea     Past Surgical History:  Procedure Laterality Date   ABDOMINAL HYSTERECTOMY  11/18/1997   Fibroids   CATARACT EXTRACTION W/PHACO Left 06/19/2023   Procedure: CATARACT EXTRACTION PHACO AND INTRAOCULAR LENS PLACEMENT (IOC)  LEFT 8.30 00:58.3;  Surgeon: Estanislado Pandy, MD;  [facility];  Service: Ophthalmology;  Laterality: Left;   CHOLECYSTECTOMY  11/18/2002   COLONOSCOPY WITH PROPOFOL N/A 08/17/2020   Procedure: COLONOSCOPY WITH PROPOFOL;  Surgeon: Pasty Spillers, MD;  Location: [facility];  Service: Gastroenterology;  Laterality: N/A;   LITHOTRIPSY     REDUCTION MAMMAPLASTY     2008   TOTAL HIP ARTHROPLASTY Left 10/2021   Dr. Odis Luster    Prior to Admission medications   Medication Sig Start Date End Date Taking? Authorizing Provider  ALPRAZolam Prudy Feeler) 0.5 MG tablet TAKE 1-2 TABLETS (0.5-1 MG TOTAL) BY MOUTH AT BEDTIME. 04/29/23   Malva Limes, MD  cyanocobalamin (VITAMIN B12) 1000 MCG tablet Take 1,000 mcg by mouth daily.    [provider]  esomeprazole (NEXIUM) 40 MG capsule TAKE 1 CAPSULE BY MOUTH EVERY DAY 10/07/22   Malva Limes, MD  FLUoxetine (PROZAC) 20 MG capsule TAKE 3 CAPSULES BY MOUTH EVERY DAY 12/17/22   Malva Limes, MD  gabapentin (NEURONTIN) 300 MG capsule TAKE 1 CAPSULE (300MG ) BY MOUTH THREE TIMES DAILY AND TAKE 2 CAPSULES (600MG ) BY MOUTH AT BEDTIME 10/18/22   Malva Limes, MD  HYDROcodone-acetaminophen (NORCO) 7.5-325 MG  tablet Take 1 tablet by mouth 4 (four) times daily as needed for moderate pain. 06/26/23   Malva Limes, MD  meclizine (ANTIVERT) 25 MG tablet Take 1 tablet (25 mg total) by mouth 3 (three) times daily as needed for dizziness. 11/08/22   Malva Limes, MD  meclizine (ANTIVERT) 25 MG tablet Take 1 tablet (25 mg total) by mouth 3 (three) times daily as needed for dizziness. 11/08/22   Malva Limes, MD  meloxicam (MOBIC) 7.5 MG tablet Take 1 tablet (7.5 mg total) by mouth daily. 06/26/23   Erasmo Downer, MD  pravastatin (PRAVACHOL) 40 MG tablet TAKE 1 TABLET BY MOUTH EVERYDAY AT BEDTIME 12/16/22   Malva Limes, MD  Specialty Vitamins Products ([employer] HAIR/SKIN/NAILS) TABS Take 1 tablet by mouth daily.     [provider]    Allergies as of 06/16/2023 - Review Complete 06/16/2023  Allergen Reaction Noted   Dexilant [dexlansoprazole] Diarrhea 06/23/2020    Family History  Problem Relation Age of Onset   Lung cancer Mother    Lung cancer Father    Hypertension Sister    COPD Sister    COPD Sister    Breast cancer Paternal Aunt     Social History   Socioeconomic History   Marital status: Divorced    Spouse name: Not on file   Number of children: 3   Years of education: 8   Highest  education level: 8th grade  Occupational History   Occupation: Disability  Tobacco Use   Smoking status: Former    Current packs/day: 0.00    Average packs/day: 1 pack/day for 27.0 years (27.0 ttl pk-yrs)    Types: Cigarettes    Start date: 73    Quit date: 11/08/2015    Years since quitting: 7.6   Smokeless tobacco: Never  Vaping Use   Vaping status: Never Used  Substance and Sexual Activity   Alcohol use: No   Drug use: Never   Sexual activity: Yes  Other Topics Concern   Not on file  Social History Narrative   Not on file   Social Determinants of Health   Financial Resource Strain: Low Risk  (05/12/2023)   Overall Financial Resource Strain (CARDIA)    Difficulty  of Paying Living Expenses: Not hard at all  Food Insecurity: No Food Insecurity (05/12/2023)   Hunger Vital Sign    Worried About Running Out of Food in the Last Year: Never true    Ran Out of Food in the Last Year: Never true  Transportation Needs: No Transportation Needs (05/12/2023)   PRAPARE - Administrator, Civil Service (Medical): No    Lack of Transportation (Non-Medical): No  Physical Activity: Insufficiently Active (05/12/2023)   Exercise Vital Sign    Days of Exercise per Week: 3 days    Minutes of Exercise per Session: 20 min  Stress: No Stress Concern Present (05/12/2023)   [employer] of Occupational Health - Occupational Stress Questionnaire    Feeling of Stress : Not at all  Social Connections: Socially Isolated (05/12/2023)   Social Connection and Isolation Panel [NHANES]    Frequency of Communication with Friends and Family: More than three times a week    Frequency of Social Gatherings with Friends and Family: More than three times a week    Attends Religious Services: Never    Database administrator or Organizations: No    Attends Banker Meetings: Never    Marital Status: Divorced  Catering manager Violence: Not At Risk (05/12/2023)   Humiliation, Afraid, Rape, and Kick questionnaire    Fear of Current or Ex-Partner: No    Emotionally Abused: No    Physically Abused: No    Sexually Abused: No    Review of Systems: See HPI, otherwise negative ROS  Physical Exam: There were no vitals taken for this visit. General:   Alert, cooperative in NAD Head:  Normocephalic and atraumatic. Respiratory:  Normal work of breathing. Cardiovascular:  RRR  Impression/Plan: Alejandra Little is here for cataract surgery.  Risks, benefits, limitations, and alternatives regarding cataract surgery have been reviewed with the patient.  Questions have been answered.  All parties agreeable.   Estanislado Pandy, MD  07/03/2023, 7:09 AM

## 2023-07-03 NOTE — Anesthesia Postprocedure Evaluation (Signed)
Anesthesia Post Note  Patient: ZURIYAH ATZ  Procedure(s) Performed: CATARACT EXTRACTION PHACO AND INTRAOCULAR LENS PLACEMENT (IOC) RIGHT  4.32  00:39.9 (Right: Eye)  Patient location during evaluation: PACU Anesthesia Type: MAC Level of consciousness: awake and alert Pain management: pain level controlled Vital Signs Assessment: post-procedure vital signs reviewed and stable Respiratory status: spontaneous breathing, nonlabored ventilation, respiratory function stable and patient connected to nasal cannula oxygen Cardiovascular status: stable and blood pressure returned to baseline Postop Assessment: no apparent nausea or vomiting Anesthetic complications: no   No notable events documented.   Last Vitals:  Vitals:   07/03/23 0831 07/03/23 0835  BP: 119/62 111/64  Pulse: 62 61  Resp: 18 (!) 4  Temp: (!) 36.3 C   SpO2: 95% 93%    Last Pain:  Vitals:   07/03/23 0715  TempSrc: Temporal  PainSc: 0-No pain                 Michiel Sivley C Flordia Kassem

## 2023-07-03 NOTE — Transfer of Care (Signed)
Immediate Anesthesia Transfer of Care Note  Patient: Alejandra Little  Procedure(s) Performed: CATARACT EXTRACTION PHACO AND INTRAOCULAR LENS PLACEMENT (IOC) RIGHT  4.32  00:39.9 (Right: Eye)  Patient Location: PACU  Anesthesia Type: MAC  Level of Consciousness: awake, alert  and patient cooperative  Airway and Oxygen Therapy: Patient Spontanous Breathing and Patient connected to supplemental oxygen  Post-op Assessment: Post-op Vital signs reviewed, Patient's Cardiovascular Status Stable, Respiratory Function Stable, Patent Airway and No signs of Nausea or vomiting  Post-op Vital Signs: Reviewed and stable  Complications: No notable events documented.

## 2023-07-03 NOTE — Op Note (Signed)
OPERATIVE NOTE  Alejandra Little [id number] 07/03/2023   PREOPERATIVE DIAGNOSIS: Nuclear sclerotic cataract right eye. H25.11   POSTOPERATIVE DIAGNOSIS: Nuclear sclerotic cataract right eye. H25.11   PROCEDURE:  Phacoemusification with posterior chamber intraocular lens placement of the right eye  Ultrasound time: Procedure(s): CATARACT EXTRACTION PHACO AND INTRAOCULAR LENS PLACEMENT (IOC) RIGHT  4.32  00:39.9 (Right)  LENS:   Implant Name Type Inv. Item Serial No. Manufacturer Lot No. LRB No. Used Action  LENS IOL TECNIS EYHANCE 23.5 - [id number] Intraocular Lens LENS IOL TECNIS EYHANCE 23.5 [id number] [employer]  Right 1 Implanted      SURGEON:  Julious Payer. Rolley Sims, MD   ANESTHESIA:  Topical with tetracaine drops, augmented with 1% preservative-free intracameral lidocaine.   COMPLICATIONS:  None.   DESCRIPTION OF PROCEDURE:  The patient was identified in the holding room and transported to the operating room and placed in the supine position under the operating microscope.  The right eye was identified as the operative eye, which was prepped and draped in the usual sterile ophthalmic fashion.   A 1 millimeter clear-corneal paracentesis was made superotemporally. Preservative-free 1% lidocaine mixed with 1:1,000 bisulfite-free aqueous solution of epinephrine was injected into the anterior chamber. The anterior chamber was then filled with Viscoat viscoelastic. A 2.4 millimeter keratome was used to make a clear-corneal incision inferotemporally. A curvilinear capsulorrhexis was made with a cystotome and capsulorrhexis forceps. Balanced salt solution was used to hydrodissect and hydrodelineate the nucleus. Phacoemulsification was then used to remove the lens nucleus and epinucleus. The remaining cortex was then removed using the irrigation and aspiration handpiece. Provisc was then placed into the capsular bag to distend it for lens placement. A +23.50 D DIB00 intraocular lens was then injected  into the capsular bag. The remaining viscoelastic was aspirated.   Wounds were hydrated with balanced salt solution.  The anterior chamber was inflated to a physiologic pressure with balanced salt solution.  No wound leaks were noted. Vigamox was injected intracamerally.  Timolol and Brimonidine drops were applied to the eye.  The patient was taken to the recovery room in stable condition without complications of anesthesia or surgery.  [name] 07/03/2023, 8:29 AM

## 2023-07-04 ENCOUNTER — Encounter: Payer: Self-pay | Admitting: Ophthalmology

## 2023-07-14 ENCOUNTER — Telehealth: Payer: Self-pay | Admitting: Family Medicine

## 2023-07-14 MED ORDER — FLUCONAZOLE 150 MG PO TABS: 150.0000 mg | ORAL_TABLET | Freq: Once | ORAL | 0 refills | Status: AC

## 2023-07-14 NOTE — Telephone Encounter (Signed)
Pt is calling in because per pt she gets a yeast infection while on Antibiotics and wanted to know could she get Diflucan sent in to help with her yeast infection that was caused by an antibiotic.

## 2023-07-14 NOTE — Telephone Encounter (Signed)
Patient advise.  

## 2023-07-23 ENCOUNTER — Other Ambulatory Visit: Payer: Self-pay | Admitting: Family Medicine

## 2023-07-23 DIAGNOSIS — Z961 Presence of intraocular lens: Secondary | ICD-10-CM | POA: Diagnosis not present

## 2023-07-23 DIAGNOSIS — H04123 Dry eye syndrome of bilateral lacrimal glands: Secondary | ICD-10-CM | POA: Diagnosis not present

## 2023-07-28 ENCOUNTER — Other Ambulatory Visit: Payer: Self-pay | Admitting: Family Medicine

## 2023-07-28 DIAGNOSIS — F419 Anxiety disorder, unspecified: Secondary | ICD-10-CM

## 2023-07-28 NOTE — Telephone Encounter (Signed)
Medication Refill - Medication: ALPRAZolam (XANAX) 0.5 MG tablet   Has the patient contacted their pharmacy? No.  Preferred Pharmacy (with phone number or street name):  [id number] [place], [place] - [address] Phone: [phone]  Fax: [phone]     Has the patient been seen for an appointment in the last year OR does the patient have an upcoming appointment? Yes.    Agent: Please be advised that RX refills may take up to 3 business days. We ask that you follow-up with your pharmacy.

## 2023-07-28 NOTE — Telephone Encounter (Signed)
Medication Refill - Medication: HYDROcodone-acetaminophen (NORCO) 7.5-325 MG tablet   Has the patient contacted their pharmacy? No.  Preferred Pharmacy (with phone number or street name):  [id number] [address], [place] Phone: [phone]  Fax: [phone]     Has the patient been seen for an appointment in the last year OR does the patient have an upcoming appointment? Yes.    Agent: Please be advised that RX refills may take up to 3 business days. We ask that you follow-up with your pharmacy.

## 2023-07-29 NOTE — Telephone Encounter (Signed)
Requested medications are due for refill today.  yes  Requested medications are on the active medications list.  yes  Last refill. 06/26/2023 #120 0 rf  Future visit scheduled.   yes  Notes to clinic.  Refill not delegated.    Requested Prescriptions  Pending Prescriptions Disp Refills   HYDROcodone-acetaminophen (NORCO) 7.5-325 MG tablet 120 tablet 0    Sig: Take 1 tablet by mouth 4 (four) times daily as needed for moderate pain.     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

## 2023-07-29 NOTE — Telephone Encounter (Signed)
Requested medication (s) are due for refill today: Yes  Requested medication (s) are on the active medication list: Yes  Last refill:  04/29/23 #45, 3RF  Future visit scheduled: Yes  Notes to clinic:  Unable to refill per protocol, cannot delegate.      Requested Prescriptions  Pending Prescriptions Disp Refills   ALPRAZolam (XANAX) 0.5 MG tablet 45 tablet 3    Sig: Take 1-2 tablets (0.5-1 mg total) by mouth at bedtime.     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

## 2023-07-30 ENCOUNTER — Ambulatory Visit (INDEPENDENT_AMBULATORY_CARE_PROVIDER_SITE_OTHER): Payer: Medicare Other | Admitting: Acute Care

## 2023-07-30 ENCOUNTER — Encounter: Payer: Self-pay | Admitting: Acute Care

## 2023-07-30 DIAGNOSIS — Z87891 Personal history of nicotine dependence: Secondary | ICD-10-CM | POA: Diagnosis not present

## 2023-07-30 MED ORDER — ALPRAZOLAM 0.5 MG PO TABS
0.5000 mg | ORAL_TABLET | Freq: Every day | ORAL | 3 refills | Status: DC
Start: 2023-07-30 — End: 2023-12-30

## 2023-07-30 MED ORDER — HYDROCODONE-ACETAMINOPHEN 7.5-325 MG PO TABS: 1.0000 | ORAL_TABLET | Freq: Four times a day (QID) | ORAL | 0 refills | Status: DC | PRN

## 2023-07-30 NOTE — Patient Instructions (Signed)

## 2023-07-30 NOTE — Progress Notes (Signed)
Virtual Visit via Telephone Note  I connected with Alejandra Little on 07/30/23 at 10:30 AM EDT by telephone and verified that I am speaking with the correct person using two identifiers.  Location: Patient: At home Provider: [address] [place], [place], [address]    I discussed the limitations, risks, security and privacy concerns of performing an evaluation and management service by telephone and the availability of in person appointments. I also discussed with the patient that there may be a patient responsible charge related to this service. The patient expressed understanding and agreed to proceed.    Shared Decision Making Visit Lung Cancer Screening Program [phone])   Eligibility: Age 67 y.o. Pack Years Smoking History Calculation 52 pack years (# packs/per year x # years smoked) Recent History of coughing up blood  no Unexplained weight loss? no ( >Than 15 pounds within the last 6 months ) Prior History Lung / other cancer no (Diagnosis within the last 5 years already requiring surveillance chest CT Scans). Smoking Status Former Smoker Former Smokers: Years since quit: 2 years  Quit Date: 11/18/2020  Visit Components: Discussion included one or more decision making aids. yes Discussion included risk/benefits of screening. yes Discussion included potential follow up diagnostic testing for abnormal scans. yes Discussion included meaning and risk of over diagnosis. yes Discussion included meaning and risk of False Positives. yes Discussion included meaning of total radiation exposure. yes  Counseling Included: Importance of adherence to annual lung cancer LDCT screening. yes Impact of comorbidities on ability to participate in the program. yes Ability and willingness to under diagnostic treatment. yes  Smoking Cessation Counseling: Current Smokers:  Discussed importance of smoking cessation. yes Information about tobacco cessation classes and interventions  provided to patient. yes Patient provided with "ticket" for LDCT Scan. yes Symptomatic Patient. no  Counseling NA Diagnosis Code: Tobacco Use Z72.0 Asymptomatic Patient yes  Counseling (Intermediate counseling: > three minutes counseling) W1027 Former Smokers:  Discussed the importance of maintaining cigarette abstinence. yes Diagnosis Code: Personal History of Nicotine Dependence. O53.664 Information about tobacco cessation classes and interventions provided to patient. Yes Patient provided with "ticket" for LDCT Scan. yes Written Order for Lung Cancer Screening with LDCT placed in Epic. Yes (CT Chest Lung Cancer Screening Low Dose W/O CM) [id number] Z12.2-Screening of respiratory organs Z87.891-Personal history of nicotine dependence  I spent 25 minutes of face to face time/virtual visit time  with  Alejandra Little discussing the risks and benefits of lung cancer screening. We took the time to pause the power point at intervals to allow for questions to be asked and answered to ensure understanding. We discussed that she had taken the single most powerful action possible to decrease her risk of developing lung cancer when she quit smoking. I counseled her to remain smoke free, and to contact me if she ever had the desire to smoke again so that I can provide resources and tools to help support the effort to remain smoke free. We discussed the time and location of the scan, and that either  Abigail Miyamoto RN, Karlton Lemon, RN or I  or I will call / send a letter with the results within  24-72 hours of receiving them. She has the office contact information in the event she needs to speak with me,  she verbalized understanding of all of the above and had no further questions upon leaving the office.     I explained to the patient that there has been a high  incidence of coronary artery disease noted on these exams. I explained that this is a non-gated exam therefore degree or severity cannot be  determined. This patient is on statin therapy. I have asked the patient to follow-up with their PCP regarding any incidental finding of coronary artery disease and management with diet or medication as they feel is clinically indicated. The patient verbalized understanding of the above and had no further questions.     Bevelyn Ngo, NP 07/30/2023 10:58 AM

## 2023-07-31 ENCOUNTER — Ambulatory Visit: Payer: Medicare Other

## 2023-08-01 ENCOUNTER — Other Ambulatory Visit: Payer: Self-pay | Admitting: Family Medicine

## 2023-08-01 DIAGNOSIS — F419 Anxiety disorder, unspecified: Secondary | ICD-10-CM

## 2023-08-05 ENCOUNTER — Ambulatory Visit: Payer: Self-pay | Admitting: *Deleted

## 2023-08-05 NOTE — Telephone Encounter (Signed)
  Chief Complaint: diarrhea when eating Symptoms: watery diarrhea when eats, preceded by stomach cramping Frequency: 2 weeks Pertinent Negatives: Patient denies nausea/vomiting, fever Disposition: [] ED /[] Urgent Care (no appt availability in office) / [x] Appointment(In office/virtual)/ []  [facility]/ [] Home Care/ [] Refused Recommended Disposition /[] [facility]/ []  Follow-up with PCP Additional Notes: Patient states she has had recent antibiotic and this did start after eating out- so she is not sure the cause. Patient states she has had watery diarrhea every time she eats for 2 weeks. Imodium did help for a couple days. Appointment has been scheduled- patient is to go out of town Thursday

## 2023-08-05 NOTE — Telephone Encounter (Signed)
Summary: Diarrhea   Pt is calling in because she has been having diarrhea for the last two weeks. Pt says she doesn't have any feelings of nausea or anything but she can't keep any food down. Pt says if she eats she has diarrhea even if she eats saltine crackers it won't stay down. Pt wants to know if there is anything she can take or what she can do to help control it.     Reason for Disposition  [1] Recent antibiotic therapy (i.e., within last 2 months) AND [2] diarrhea present > 3 days since antibiotic was stopped  Answer Assessment - Initial Assessment Questions 1. DIARRHEA SEVERITY: "How bad is the diarrhea?" "How many more stools have you had in the past 24 hours than normal?"    - NO DIARRHEA (SCALE 0)   - MILD (SCALE 1-3): Few loose or mushy BMs; increase of 1-3 stools over normal daily number of stools; mild increase in ostomy output.   -  MODERATE (SCALE 4-7): Increase of 4-6 stools daily over normal; moderate increase in ostomy output.   -  SEVERE (SCALE 8-10; OR "WORST POSSIBLE"): Increase of 7 or more stools daily over normal; moderate increase in ostomy output; incontinence.     Diarrhea with eating- not drinking a lot- sips 2. ONSET: "When did the diarrhea begin?"      2 weeks 3. BM CONSISTENCY: "How loose or watery is the diarrhea?"      water 4. VOMITING: "Are you also vomiting?" If Yes, ask: "How many times in the past 24 hours?"      none 5. ABDOMEN PAIN: "Are you having any abdomen pain?" If Yes, ask: "What does it feel like?" (e.g., crampy, dull, intermittent, constant)      Cramping before BM 6. ABDOMEN PAIN SEVERITY: If present, ask: "How bad is the pain?"  (e.g., Scale 1-10; mild, moderate, or severe)   - MILD (1-3): doesn't interfere with normal activities, abdomen soft and not tender to touch    - MODERATE (4-7): interferes with normal activities or awakens from sleep, abdomen tender to touch    - SEVERE (8-10): excruciating pain, doubled over, unable to do any  normal activities       Only cramping pre BM 7. ORAL INTAKE: If vomiting, "Have you been able to drink liquids?" "How much liquids have you had in the past 24 hours?"     Sips of liquids 8. HYDRATION: "Any signs of dehydration?" (e.g., dry mouth [not just dry lips], too weak to stand, dizziness, new weight loss) "When did you last urinate?"     No, urination- today- no problem, clear 9. EXPOSURE: "Have you traveled to a foreign country recently?" "Have you been exposed to anyone with diarrhea?" "Could you have eaten any food that was spoiled?"     Possible- started after eating out- had nausea and diarrhea 10. ANTIBIOTIC USE: "Are you taking antibiotics now or have you taken antibiotics in the past 2 months?"       Yes- patient was treated for UTI-06/26/23 11. OTHER SYMPTOMS: "Do you have any other symptoms?" (e.g., fever, blood in stool)       no  Protocols used: Diarrhea-A-AH

## 2023-08-06 ENCOUNTER — Telehealth: Payer: Self-pay

## 2023-08-06 ENCOUNTER — Encounter: Payer: Self-pay | Admitting: Physician Assistant

## 2023-08-06 ENCOUNTER — Ambulatory Visit (INDEPENDENT_AMBULATORY_CARE_PROVIDER_SITE_OTHER): Payer: Medicare Other | Admitting: Physician Assistant

## 2023-08-06 VITALS — BP 159/63 | HR 66 | Ht 64.0 in | Wt 204.1 lb

## 2023-08-06 DIAGNOSIS — K648 Other hemorrhoids: Secondary | ICD-10-CM | POA: Diagnosis not present

## 2023-08-06 DIAGNOSIS — R197 Diarrhea, unspecified: Secondary | ICD-10-CM | POA: Diagnosis not present

## 2023-08-06 DIAGNOSIS — R030 Elevated blood-pressure reading, without diagnosis of hypertension: Secondary | ICD-10-CM | POA: Diagnosis not present

## 2023-08-06 DIAGNOSIS — R350 Frequency of micturition: Secondary | ICD-10-CM

## 2023-08-06 DIAGNOSIS — R5383 Other fatigue: Secondary | ICD-10-CM

## 2023-08-06 LAB — POCT URINALYSIS DIPSTICK
Blood, UA: NEGATIVE
Glucose, UA: NEGATIVE
Ketones, UA: NEGATIVE
Nitrite, UA: NEGATIVE
Protein, UA: POSITIVE — AB
Spec Grav, UA: <=1.005 — AB (ref 1.010–1.025)
Urobilinogen, UA: 0.2 U/dL
pH, UA: 6 (ref 5.0–8.0)

## 2023-08-06 NOTE — Progress Notes (Signed)
Established patient visit  Patient: Alejandra Little   DOB: 04-03-1956   67 y.o. Female  MRN: [id number] Visit Date: 08/06/2023  Today's healthcare provider: Debera Lat, PA-C   Chief Complaint  Patient presents with   Acute Visit    Has been having diarrhea for 2 consecutive weeks, skin has become raw and hemorrhoidal, prior treatment has been medicaid Tucks Pads OTC and Immodium daily. Immodium eases diarrhea but problem still exists. Possible Cdiff    Subjective      Discussed the use of AI scribe software for clinical note transcription with the patient, who gave verbal consent to proceed.  History of Present Illness   The patient presents with a two-week history of diarrhea and anal discomfort. They report using Preparation H wipes for self-care and have been taking Imodium AD to manage the diarrhea. The patient has been in contact with their grandchild who has been diagnosed with C. Diff, raising concerns about possible transmission. They report feeling extremely tired and weak, with difficulty keeping anything on their stomach, including water. They have not experienced any chest pain or shortness of breath. The patient also mentions a recent course of antibiotics for a suspected kidney infection and a Diflucan for a yeast infection.           08/06/2023   10:52 AM 06/26/2023   10:52 AM 05/12/2023    2:07 PM  Depression screen PHQ 2/9  Decreased Interest 2 3 1   Down, Depressed, Hopeless 2 2 1   PHQ - 2 Score 4 5 2   Altered sleeping 3 1   Tired, decreased energy 2 0   Change in appetite 0 1   Feeling bad or failure about yourself  2 2   Trouble concentrating 0 1   Moving slowly or fidgety/restless 0 0   Suicidal thoughts 0 0   PHQ-9 Score 11 10   Difficult doing work/chores  Not difficult at all Not difficult at all      08/06/2023   10:52 AM 05/06/2023   11:02 AM 12/05/2020    9:47 AM  GAD 7 : Generalized Anxiety Score  Nervous, Anxious, on Edge 1 3 1   Control/stop  worrying 1 3 0  Worry too much - different things 1 3 1   Trouble relaxing 1 3 0  Restless 1 2 1   Easily annoyed or irritable 1 0 1  Afraid - awful might happen 1 3 0  Total GAD 7 Score 7 17 4   Anxiety Difficulty  Not difficult at all Not difficult at all    Medications: Outpatient Medications Prior to Visit  Medication Sig   ALPRAZolam (XANAX) 0.5 MG tablet Take 1-2 tablets (0.5-1 mg total) by mouth at bedtime.   cyanocobalamin (VITAMIN B12) 1000 MCG tablet Take 1,000 mcg by mouth daily.   esomeprazole (NEXIUM) 40 MG capsule TAKE 1 CAPSULE BY MOUTH EVERY DAY   FLUoxetine (PROZAC) 20 MG capsule TAKE 3 CAPSULES BY MOUTH EVERY DAY   gabapentin (NEURONTIN) 300 MG capsule TAKE 1 CAPSULE (300MG ) BY MOUTH THREE TIMES DAILY AND TAKE 2 CAPSULES (600MG ) BY MOUTH AT BEDTIME   HYDROcodone-acetaminophen (NORCO) 7.5-325 MG tablet Take 1 tablet by mouth 4 (four) times daily as needed for moderate pain.   meclizine (ANTIVERT) 25 MG tablet Take 1 tablet (25 mg total) by mouth 3 (three) times daily as needed for dizziness.   meclizine (ANTIVERT) 25 MG tablet Take 1 tablet (25 mg total) by mouth 3 (three) times daily as needed for dizziness.  meloxicam (MOBIC) 7.5 MG tablet TAKE 1 TABLET BY MOUTH EVERY DAY   pravastatin (PRAVACHOL) 40 MG tablet TAKE 1 TABLET BY MOUTH EVERYDAY AT BEDTIME   Specialty Vitamins Products ([facility] HAIR/SKIN/NAILS) TABS Take 1 tablet by mouth daily.    No facility-administered medications prior to visit.    Review of Systems  All other systems reviewed and are negative.  Except see HPI       Objective    BP (!) 159/63 (BP Location: Right Arm, Patient Position: Sitting, Cuff Size: Large)   Pulse 66   Ht 5\' 4"  (1.626 m)   Wt 204 lb 1.6 oz (92.6 kg)   SpO2 97%   BMI 35.03 kg/m     Physical Exam Vitals reviewed.  Constitutional:      General: She is not in acute distress.    Appearance: Normal appearance. She is well-developed. She is not diaphoretic.  HENT:      Head: Normocephalic and atraumatic.  Eyes:     General: No scleral icterus.    Conjunctiva/sclera: Conjunctivae normal.  Neck:     Thyroid: No thyromegaly.  Cardiovascular:     Rate and Rhythm: Normal rate and regular rhythm.     Pulses: Normal pulses.     Heart sounds: Normal heart sounds. No murmur heard. Pulmonary:     Effort: Pulmonary effort is normal. No respiratory distress.     Breath sounds: Normal breath sounds. No wheezing, rhonchi or rales.  Musculoskeletal:     Cervical back: Neck supple.     Right lower leg: No edema.     Left lower leg: No edema.  Lymphadenopathy:     Cervical: No cervical adenopathy.  Skin:    General: Skin is warm and dry.     Findings: No rash.  Neurological:     Mental Status: She is alert and oriented to person, place, and time. Mental status is at baseline.  Psychiatric:        Mood and Affect: Mood normal.        Behavior: Behavior normal.      Results for orders placed or performed in visit on 08/06/23  Comprehensive Metabolic Panel (CMET)  Result Value Ref Range   Glucose 95 70 - 99 mg/dL   BUN 11 8 - 27 mg/dL   Creatinine, Ser 4.09 0.57 - 1.00 mg/dL   eGFR 75 >81 XB/JYN/8.29   BUN/Creatinine Ratio 13 12 - 28   Sodium 145 (H) 134 - 144 mmol/L   Potassium 4.0 3.5 - 5.2 mmol/L   Chloride 105 96 - 106 mmol/L   CO2 25 20 - 29 mmol/L   Calcium 9.5 8.7 - 10.3 mg/dL   Total Protein 6.4 6.0 - 8.5 g/dL   Albumin 4.2 3.9 - 4.9 g/dL   Globulin, Total 2.2 1.5 - 4.5 g/dL   Bilirubin Total 0.4 0.0 - 1.2 mg/dL   Alkaline Phosphatase 60 44 - 121 IU/L   AST 23 0 - 40 IU/L   ALT 18 0 - 32 IU/L  CBC w/Diff/Platelet  Result Value Ref Range   WBC 5.7 3.4 - 10.8 x10E3/uL   RBC 4.60 3.77 - 5.28 x10E6/uL   Hemoglobin 13.7 11.1 - 15.9 g/dL   Hematocrit 56.2 13.0 - 46.6 %   MCV 91 79 - 97 fL   MCH 29.8 26.6 - 33.0 pg   MCHC 32.8 31.5 - 35.7 g/dL   RDW 86.5 78.4 - 69.6 %   Platelets 178 150 - 450 x10E3/uL  Neutrophils 56 Not Estab. %    Lymphs 34 Not Estab. %   Monocytes 7 Not Estab. %   Eos 2 Not Estab. %   Basos 1 Not Estab. %   Neutrophils Absolute 3.2 1.4 - 7.0 x10E3/uL   Lymphocytes Absolute 1.9 0.7 - 3.1 x10E3/uL   Monocytes Absolute 0.4 0.1 - 0.9 x10E3/uL   EOS (ABSOLUTE) 0.1 0.0 - 0.4 x10E3/uL   Basophils Absolute 0.1 0.0 - 0.2 x10E3/uL   Immature Granulocytes 0 Not Estab. %   Immature Grans (Abs) 0.0 0.0 - 0.1 x10E3/uL  POCT Urinalysis Dipstick  Result Value Ref Range   Color, UA Dark    Clarity, UA     Glucose, UA Negative Negative   Bilirubin, UA     Ketones, UA negative    Spec Grav, UA <=1.005 (A) 1.010 - 1.025   Blood, UA negative    pH, UA 6.0 5.0 - 8.0   Protein, UA Positive (A) Negative   Urobilinogen, UA 0.2 0.2 or 1.0 E.U./dL   Nitrite, UA negative    Leukocytes, UA Trace (A) Negative   Appearance     Odor      Assessment & Plan       1. Other fatigue Could be association with diarrhea, dehydration, pain Initial workup - Cdiff NAA+O+P+Stool Culture - Comprehensive Metabolic Panel (CMET) - CBC w/Diff/Platelet - POCT Urinalysis Dipstick Will reassess after  receiving lab results Will FU  2. Diarrhea of presumed infectious origin Persistent for two weeks, associated with fatigue and dehydration. Recent exposure to grandchild with confirmed C. Diff infection. Recent antibiotic use for suspected UTI. -Collect stool sample for C. Diff testing. -Order blood work to assess for dehydration and electrolyte imbalances. -Advise to increase fluid intake, specifically Pedialyte or diluted Gatorade for electrolyte replenishment. -Advise to eat light, easily digestible foods. Advised to avoid coffee, alcohol, dairy products, fruits, vegetables, red meats, and heavily seasoned foods .  3. Other hemorrhoids Painful, flare of chronic condition Using Preparation H wipes for symptom management. -Continue current symptomatic management. If symptoms persist, Rx for topical will be placed.  4.  Urinary frequency Potential UTI Recent antibiotic use for suspected UTI, ongoing back pain. -Collect urine sample for urinalysis and culture. POCT dipstick showed trace of leukocytes  5. Elevated blood pressure  Elevated blood pressure noted during visit, no history of hypertension. Could be due to diarrhea/dehydration, pain. -Monitor blood pressure at home Drink plenty of water Will fu   No follow-ups on file.     The patient was advised to call back or seek an in-person evaluation if the symptoms worsen or if the condition fails to improve as anticipated.  I discussed the assessment and treatment plan with the patient. The patient was provided an opportunity to ask questions and all were answered. The patient agreed with the plan and demonstrated an understanding of the instructions.  I, Debera Lat, PA-C have reviewed all documentation for this visit. The documentation on  08/06/23 for the exam, diagnosis, procedures, and orders are all accurate and complete.  Debera Lat, [facility], MMS [facility] [phone] (phone) [phone] (fax)  [facility]

## 2023-08-06 NOTE — Telephone Encounter (Signed)
Patient was called to inform her that we need a urine sample for  urine culture

## 2023-08-07 DIAGNOSIS — R5383 Other fatigue: Secondary | ICD-10-CM | POA: Diagnosis not present

## 2023-08-07 LAB — CBC WITH DIFFERENTIAL/PLATELET
Basophils Absolute: 0.1 10*3/uL (ref 0.0–0.2)
Basos: 1 %
EOS (ABSOLUTE): 0.1 10*3/uL (ref 0.0–0.4)
Eos: 2 %
Hematocrit: 41.8 % (ref 34.0–46.6)
Hemoglobin: 13.7 g/dL (ref 11.1–15.9)
Immature Grans (Abs): 0 10*3/uL (ref 0.0–0.1)
Immature Granulocytes: 0 %
Lymphocytes Absolute: 1.9 10*3/uL (ref 0.7–3.1)
Lymphs: 34 %
MCH: 29.8 pg (ref 26.6–33.0)
MCHC: 32.8 g/dL (ref 31.5–35.7)
MCV: 91 fL (ref 79–97)
Monocytes Absolute: 0.4 10*3/uL (ref 0.1–0.9)
Monocytes: 7 %
Neutrophils Absolute: 3.2 10*3/uL (ref 1.4–7.0)
Neutrophils: 56 %
Platelets: 178 10*3/uL (ref 150–450)
RBC: 4.6 x10E6/uL (ref 3.77–5.28)
RDW: 12.2 % (ref 11.7–15.4)
WBC: 5.7 10*3/uL (ref 3.4–10.8)

## 2023-08-07 LAB — COMPREHENSIVE METABOLIC PANEL
ALT: 18 IU/L (ref 0–32)
AST: 23 IU/L (ref 0–40)
Albumin: 4.2 g/dL (ref 3.9–4.9)
Alkaline Phosphatase: 60 IU/L (ref 44–121)
BUN/Creatinine Ratio: 13 (ref 12–28)
BUN: 11 mg/dL (ref 8–27)
Bilirubin Total: 0.4 mg/dL (ref 0.0–1.2)
CO2: 25 mmol/L (ref 20–29)
Calcium: 9.5 mg/dL (ref 8.7–10.3)
Chloride: 105 mmol/L (ref 96–106)
Creatinine, Ser: 0.85 mg/dL (ref 0.57–1.00)
Globulin, Total: 2.2 g/dL (ref 1.5–4.5)
Glucose: 95 mg/dL (ref 70–99)
Potassium: 4 mmol/L (ref 3.5–5.2)
Sodium: 145 mmol/L — ABNORMAL HIGH (ref 134–144)
Total Protein: 6.4 g/dL (ref 6.0–8.5)
eGFR: 75 mL/min/{1.73_m2} (ref >59)

## 2023-08-08 DIAGNOSIS — R197 Diarrhea, unspecified: Secondary | ICD-10-CM | POA: Insufficient documentation

## 2023-08-08 DIAGNOSIS — R5383 Other fatigue: Secondary | ICD-10-CM | POA: Insufficient documentation

## 2023-08-08 DIAGNOSIS — K648 Other hemorrhoids: Secondary | ICD-10-CM | POA: Insufficient documentation

## 2023-08-08 DIAGNOSIS — I10 Essential (primary) hypertension: Secondary | ICD-10-CM | POA: Insufficient documentation

## 2023-08-08 DIAGNOSIS — R350 Frequency of micturition: Secondary | ICD-10-CM | POA: Insufficient documentation

## 2023-08-11 ENCOUNTER — Telehealth: Payer: Self-pay

## 2023-08-11 NOTE — Telephone Encounter (Signed)
Pt given lab results per notes of J. Oswalt PA on 08/11/23. Pt started cry and was frustrated hoping for a dx. Pt stated she has been on bland diet and staying away from family in case it was C-diff. Pt wanted to know what to do and wanted to talk to her PCP. Emotional support and encouragement provided throughout call. Assisted pt with MyChart sign in and how to access her video visit with Dr. Sherrie Mustache. Pt frustrated with no dx and said that she has been doing everything she was advised to do. Pt stated she is getting hemorrhoids.

## 2023-08-12 ENCOUNTER — Ambulatory Visit (INDEPENDENT_AMBULATORY_CARE_PROVIDER_SITE_OTHER): Payer: Medicare Other | Admitting: Family Medicine

## 2023-08-12 VITALS — BP 134/87 | HR 70 | Temp 98.6°F | Ht 64.0 in | Wt 203.0 lb

## 2023-08-12 DIAGNOSIS — R197 Diarrhea, unspecified: Secondary | ICD-10-CM | POA: Diagnosis not present

## 2023-08-12 MED ORDER — DIPHENOXYLATE-ATROPINE 2.5-0.025 MG PO TABS
1.0000 | ORAL_TABLET | Freq: Two times a day (BID) | ORAL | 0 refills | Status: DC | PRN
Start: 2023-08-12 — End: 2024-08-23

## 2023-08-12 MED ORDER — METRONIDAZOLE 500 MG PO TABS
500.0000 mg | ORAL_TABLET | Freq: Three times a day (TID) | ORAL | 0 refills | Status: AC
Start: 2023-08-12 — End: 2023-08-19

## 2023-08-12 NOTE — Progress Notes (Signed)
Established patient visit   Patient: Alejandra Little   DOB: 09-01-1956   67 y.o. Female  MRN: [id number] Visit Date: 08/12/2023  Today's healthcare provider: Mila Merry, MD   Chief Complaint  Patient presents with   Diarrhea    Patient states she has been having diarrhea for 3 weeks now.  She had been taking Immodium and when she was in last week she was told to stop taking it because she was taking too much.  Patient had stool studies last week and states they were negative.    Subjective    Discussed the use of AI scribe software for clinical note transcription with the patient, who gave verbal consent to proceed.  History of Present Illness   The patient, with a history of back pain, presents with a two and half week history of diarrhea. She reports that the diarrhea is watery and occurs frequently, even after consuming liquids. The patient denies recent travel or exposure to new environments. She reports one episode of chills at the onset of the diarrhea but denies any subsequent fevers, chills, or sweats. The patient denies nausea or vomiting but reports abdominal discomfort.  The diarrhea started a week after the patient was prescribed an antibiotic for back pain, which she suspected to be due to a kidney infection. The patient also reports persistent back pain, but denies symptoms of a urinary tract infection. She has been taking Imodium AD for the diarrhea, but it has not been effective.  The patient has not been able to eat or drink much due to fear of exacerbating the diarrhea. She expresses frustration and a desire to return to her normal activities. She also expresses concern about her weight, as she has not lost weight despite the prolonged diarrhea.       Medications: Outpatient Medications Prior to Visit  Medication Sig   ALPRAZolam (XANAX) 0.5 MG tablet Take 1-2 tablets (0.5-1 mg total) by mouth at bedtime.   cyanocobalamin (VITAMIN B12) 1000 MCG tablet Take  1,000 mcg by mouth daily.   esomeprazole (NEXIUM) 40 MG capsule TAKE 1 CAPSULE BY MOUTH EVERY DAY   FLUoxetine (PROZAC) 20 MG capsule TAKE 3 CAPSULES BY MOUTH EVERY DAY   gabapentin (NEURONTIN) 300 MG capsule TAKE 1 CAPSULE (300MG ) BY MOUTH THREE TIMES DAILY AND TAKE 2 CAPSULES (600MG ) BY MOUTH AT BEDTIME   HYDROcodone-acetaminophen (NORCO) 7.5-325 MG tablet Take 1 tablet by mouth 4 (four) times daily as needed for moderate pain.   meclizine (ANTIVERT) 25 MG tablet Take 1 tablet (25 mg total) by mouth 3 (three) times daily as needed for dizziness.   meclizine (ANTIVERT) 25 MG tablet Take 1 tablet (25 mg total) by mouth 3 (three) times daily as needed for dizziness.   meloxicam (MOBIC) 7.5 MG tablet TAKE 1 TABLET BY MOUTH EVERY DAY   pravastatin (PRAVACHOL) 40 MG tablet TAKE 1 TABLET BY MOUTH EVERYDAY AT BEDTIME   Specialty Vitamins Products ([employer] HAIR/SKIN/NAILS) TABS Take 1 tablet by mouth daily.    No facility-administered medications prior to visit.   Review of Systems     Objective    BP 134/87 (BP Location: Left Arm, Patient Position: Sitting, Cuff Size: Normal)   Pulse 70   Temp 98.6 F (37 C) (Oral)   Ht 5\' 4"  (1.626 m)   Wt 203 lb (92.1 kg)   SpO2 99%   BMI 34.84 kg/m   Physical Exam  General appearance: Mildly obese female, cooperative and in  no acute distress Head: Normocephalic, without obvious abnormality, atraumatic Respiratory: Respirations even and unlabored, normal respiratory rate Extremities: All extremities are intact.  Skin: Skin color, texture, turgor normal. No rashes seen  Psych: Appropriate mood and affect. Neurologic: Mental status: Alert, oriented to person, place, and time, thought content appropriate.   Assessment & Plan        Diarrhea Persistent for over two weeks, watery, associated with urgency. No blood in stool. No recent travel, fever, or vomiting. Negative for C. diff. O&P test pending. -Start Metronidazole to cover potential bacterial or  parasitic infection. -Start Lomotil to manage symptoms.    No follow-ups on file.      Mila Merry, MD  [facility] [phone] (phone) [phone] (fax)  [facility]

## 2023-08-14 LAB — CDIFF NAA+O+P+STOOL CULTURE
Toxigenic C. Difficile by PCR: NEGATIVE
Toxigenic C. Difficile by PCR: NEGATIVE

## 2023-08-18 ENCOUNTER — Telehealth: Payer: Self-pay

## 2023-08-18 MED ORDER — FLUCONAZOLE 150 MG PO TABS: 150.0000 mg | ORAL_TABLET | Freq: Once | ORAL | 0 refills | Status: AC

## 2023-08-18 NOTE — Telephone Encounter (Signed)
Copied from CRM [id number]. Topic: General - Inquiry >> Aug 18, 2023 12:29 PM Teressa P wrote: Reason for CRM: pt is on an antibiotic and has a yeast infection.  She is asking for Diflucan pills.  [facility]

## 2023-08-20 DIAGNOSIS — M1711 Unilateral primary osteoarthritis, right knee: Secondary | ICD-10-CM | POA: Diagnosis not present

## 2023-08-28 ENCOUNTER — Other Ambulatory Visit: Payer: Self-pay | Admitting: Family Medicine

## 2023-08-28 NOTE — Telephone Encounter (Signed)
Medication Refill - Medication: HYDROcodone-acetaminophen (NORCO) 7.5-325 MG tablet   Has the patient contacted their pharmacy? Yes.     Preferred Pharmacy (with phone number or street name):  [facility] #[id number] [place], [place] - [address] AT [facility] [address] Phone: [phone]  Fax: [phone]      Has the patient been seen for an appointment in the last year OR does the patient have an upcoming appointment? Yes.    Please assist patient further

## 2023-08-28 NOTE — Telephone Encounter (Signed)
Requested medication (s) are due for refill today: routing for review  Requested medication (s) are on the active medication list: yes  Last refill:  07/30/23  Future visit scheduled: yes  Notes to clinic:  Unable to refill per protocol, cannot delegate.      Requested Prescriptions  Pending Prescriptions Disp Refills   HYDROcodone-acetaminophen (NORCO) 7.5-325 MG tablet 120 tablet 0    Sig: Take 1 tablet by mouth 4 (four) times daily as needed for moderate pain.     Not Delegated - Analgesics:  Opioid Agonist Combinations Failed - 08/28/2023 11:38 AM      Failed - This refill cannot be delegated      Failed - Urine Drug Screen completed in last 360 days      Passed - Valid encounter within last 3 months    Recent Outpatient Visits           2 weeks ago Diarrhea of presumed infectious origin   [facility] Malva Limes, MD   3 weeks ago Diarrhea of presumed infectious origin   [place] [facility] [place], [place], PA-C   2 months ago Dysuria   [place] [facility] [place], Marzella Schlein, MD   3 months ago 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

## 2023-08-29 ENCOUNTER — Other Ambulatory Visit: Payer: Self-pay | Admitting: Family Medicine

## 2023-08-29 DIAGNOSIS — K219 Gastro-esophageal reflux disease without esophagitis: Secondary | ICD-10-CM

## 2023-08-29 MED ORDER — HYDROCODONE-ACETAMINOPHEN 7.5-325 MG PO TABS: 1.0000 | ORAL_TABLET | Freq: Four times a day (QID) | ORAL | 0 refills | Status: DC | PRN

## 2023-08-29 NOTE — Telephone Encounter (Signed)
Requested Prescriptions  Pending Prescriptions Disp Refills   esomeprazole (NEXIUM) 40 MG capsule [Pharmacy Med Name: NEXIUM DR 40 MG CAPSULE] 90 capsule 1    Sig: TAKE 1 CAPSULE BY MOUTH EVERY DAY     Gastroenterology: Proton Pump Inhibitors 2 Passed - 08/29/2023 12:16 PM      Passed - ALT in normal range and within 360 days    ALT  Date Value Ref Range Status  08/06/2023 18 0 - 32 IU/L Final   SGPT (ALT)  Date Value Ref Range Status  11/30/2012 24 12 - 78 U/L Final         Passed - AST in normal range and within 360 days    AST  Date Value Ref Range Status  08/06/2023 23 0 - 40 IU/L Final   SGOT(AST)  Date Value Ref Range Status  11/30/2012 22 15 - 37 Unit/L Final         Passed - Valid encounter within last 12 months    Recent Outpatient Visits           2 weeks ago Diarrhea of presumed infectious origin   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

## 2023-09-22 ENCOUNTER — Other Ambulatory Visit: Payer: Self-pay | Admitting: Family Medicine

## 2023-09-22 DIAGNOSIS — Z1231 Encounter for screening mammogram for malignant neoplasm of breast: Secondary | ICD-10-CM

## 2023-09-23 ENCOUNTER — Telehealth: Payer: Self-pay | Admitting: *Deleted

## 2023-09-23 NOTE — Telephone Encounter (Signed)
Attempted to contact pt. Left message for pt to call back to schedule initial lung screening CT.

## 2023-09-29 ENCOUNTER — Other Ambulatory Visit: Payer: Self-pay | Admitting: Family Medicine

## 2023-09-29 NOTE — Telephone Encounter (Signed)
Medication Refill -  Most Recent Primary Care Visit:  Provider: Malva Limes  Department: [facility]  Visit Type: OFFICE VISIT  Date: 08/12/2023  Medication: HYDROcodone-acetaminophen (NORCO) 7.5-325 MG tablet   Has the patient contacted their pharmacy? No  Is this the correct pharmacy for this prescription? Yes  This is the patient's preferred pharmacy: [facility] #[id number] [place], [place] - [address] AT [facility] [address] [place] [address] Phone: [phone] Fax: [phone]  Has the prescription been filled recently? Yes  Is the patient out of the medication? Yes  Has the patient been seen for an appointment in the last year OR does the patient have an upcoming appointment? Yes  Can we respond through MyChart? Yes  Agent: Please be advised that Rx refills may take up to 3 business days. We ask that you follow-up with your pharmacy.

## 2023-09-30 DIAGNOSIS — M1711 Unilateral primary osteoarthritis, right knee: Secondary | ICD-10-CM | POA: Diagnosis not present

## 2023-09-30 DIAGNOSIS — Z23 Encounter for immunization: Secondary | ICD-10-CM | POA: Diagnosis not present

## 2023-09-30 NOTE — Telephone Encounter (Signed)
Requested medication (s) are due for refill today -yes  Requested medication (s) are on the active medication list -yes  Future visit scheduled -yes  Last refill: 08/29/23 #120  Notes to clinic: non delegated Rx  Requested Prescriptions  Pending Prescriptions Disp Refills   HYDROcodone-acetaminophen (NORCO) 7.5-325 MG tablet 120 tablet 0    Sig: Take 1 tablet by mouth 4 (four) times daily as needed for moderate pain (pain score 4-6).     Not Delegated - Analgesics:  Opioid Agonist Combinations Failed - 09/29/2023  1:46 PM      Failed - This refill cannot be delegated      Failed - Urine Drug Screen completed in last 360 days      Passed - Valid encounter within last 3 months    Recent Outpatient Visits           1 month ago Diarrhea of presumed infectious origin   [facility] Malva Limes, MD   1 month ago Diarrhea of presumed infectious origin   [facility] [place], [place], PA-C   3 months ago Dysuria   [place] [place], Marzella Schlein, MD   4 months ago 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     Not Delegated - Analgesics:  Opioid Agonist Combinations Failed - 09/29/2023  1:46 PM      Failed - This refill cannot be delegated      Failed - Urine Drug Screen completed in last 360 days      Passed - Valid encounter within last 3  months    Recent Outpatient Visits           1 month ago Diarrhea of presumed infectious origin   [facility] Malva Limes, MD   1 month ago Diarrhea of presumed infectious origin   [place] [facility] [place], [place], PA-C   3 months ago Dysuria   [place] [facility] [place], Marzella Schlein, MD   4 months ago 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

## 2023-10-02 MED ORDER — HYDROCODONE-ACETAMINOPHEN 7.5-325 MG PO TABS: 1.0000 | ORAL_TABLET | Freq: Four times a day (QID) | ORAL | 0 refills | Status: DC | PRN

## 2023-10-14 ENCOUNTER — Ambulatory Visit
Admission: RE | Admit: 2023-10-14 | Discharge: 2023-10-14 | Disposition: A | Discharge status: Home / Self Care | Payer: Medicare Other | Source: Ambulatory Visit | Attending: Family Medicine | Admitting: Family Medicine

## 2023-10-14 DIAGNOSIS — Z1231 Encounter for screening mammogram for malignant neoplasm of breast: Secondary | ICD-10-CM | POA: Diagnosis not present

## 2023-10-28 ENCOUNTER — Other Ambulatory Visit: Payer: Self-pay | Admitting: Family Medicine

## 2023-10-28 DIAGNOSIS — E785 Hyperlipidemia, unspecified: Secondary | ICD-10-CM

## 2023-10-29 NOTE — Telephone Encounter (Signed)
Requested Prescriptions  Pending Prescriptions Disp Refills   pravastatin (PRAVACHOL) 40 MG tablet [Pharmacy Med Name: PRAVASTATIN SODIUM 40 MG TAB] 90 tablet 3    Sig: TAKE 1 TABLET BY MOUTH EVERYDAY AT BEDTIME     Cardiovascular:  Antilipid - Statins Failed - 10/28/2023  6:35 PM      Failed - Lipid Panel in normal range within the last 12 months    Cholesterol, Total  Date Value Ref Range Status  12/02/2022 197 100 - 199 mg/dL Final   LDL Chol Calc (NIH)  Date Value Ref Range Status  12/02/2022 124 (H) 0 - 99 mg/dL Final   HDL  Date Value Ref Range Status  12/02/2022 49 >39 mg/dL Final   Triglycerides  Date Value Ref Range Status  12/02/2022 136 0 - 149 mg/dL Final         Passed - Patient is not pregnant      Passed - Valid encounter within last 12 months    Recent Outpatient Visits           2 months ago Diarrhea of presumed infectious origin   [facility] Malva Limes, MD   2 months ago Diarrhea of presumed infectious origin   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

## 2023-10-30 ENCOUNTER — Other Ambulatory Visit: Payer: Self-pay | Admitting: Family Medicine

## 2023-10-30 MED ORDER — HYDROCODONE-ACETAMINOPHEN 7.5-325 MG PO TABS: 1.0000 | ORAL_TABLET | Freq: Four times a day (QID) | ORAL | 0 refills | Status: DC | PRN

## 2023-10-30 NOTE — Telephone Encounter (Signed)
Medication Refill -  Most Recent Primary Care Visit:  Provider: Malva Limes  Department: [facility]  Visit Type: OFFICE VISIT  Date: 08/12/2023  Medication:  HYDROcodone-acetaminophen (NORCO) 7.5-325 MG tablet   Has the patient contacted their pharmacy? No (Agent: If no, request that the patient contact the pharmacy for the refill. If patient does not wish to contact the pharmacy document the reason why and proceed with request.) (Agent: If yes, when and what did the pharmacy advise?)  Is this the correct pharmacy for this prescription? Yes  This is the patient's preferred pharmacy: [facility] #[id number] [place], [place] - [address] AT [facility] OF [address] [place] [address] Phone: [phone] Fax: [phone]  Has the prescription been filled recently? Yes  Is the patient out of the medication? Yes  Has the patient been seen for an appointment in the last year OR does the patient have an upcoming appointment? Yes  Can we respond through MyChart? No  Agent: Please be advised that Rx refills may take up to 3 business days. We ask that you follow-up with your pharmacy.

## 2023-10-30 NOTE — Telephone Encounter (Signed)
Requested medications are due for refill today.  yes  Requested medications are on the active medications list.  yes  Last refill. 10/02/2023 #120 0 rf  Future visit scheduled.   yes  Notes to clinic.  Refill not delegated.    Requested Prescriptions  Pending Prescriptions Disp Refills   HYDROcodone-acetaminophen (NORCO) 7.5-325 MG tablet 120 tablet 0    Sig: Take 1 tablet by mouth 4 (four) times daily as needed for moderate pain (pain score 4-6).     Not Delegated - Analgesics:  Opioid Agonist Combinations Failed - 10/30/2023 11:31 AM      Failed - This refill cannot be delegated      Failed - Urine Drug Screen completed in last 360 days      Passed - Valid encounter within last 3 months    Recent Outpatient Visits           2 months ago Diarrhea of presumed infectious origin   [facility] Malva Limes, MD   2 months ago Diarrhea of presumed infectious origin   [place] [place], [place], PA-C   4 months ago Dysuria   [place] [facility] [place], Marzella Schlein, MD   5 months ago 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

## 2023-11-05 ENCOUNTER — Ambulatory Visit: Payer: Medicare Other | Admitting: Family Medicine

## 2023-11-25 ENCOUNTER — Encounter: Payer: Self-pay | Admitting: *Deleted

## 2023-11-27 ENCOUNTER — Other Ambulatory Visit: Payer: Self-pay | Admitting: Family Medicine

## 2023-11-27 NOTE — Telephone Encounter (Signed)
 Medication Refill -  Most Recent Primary Care Visit:  Provider: Malva Limes  Department: [facility]  Visit Type: OFFICE VISIT  Date: 08/12/2023  Medication: HYDROcodone-acetaminophen (NORCO) 7.5-325 MG tablet   Has the patient contacted their pharmacy? No  Is this the correct pharmacy for this prescription? Yes  This is the patient's preferred pharmacy: [facility] #[id number] [place], [place] - [address] AT [facility] OF [address] [place] [address] Phone: [phone] Fax: [phone]  Has the prescription been filled recently? Yes  Is the patient out of the medication? Yes  Has the patient been seen for an appointment in the last year OR does the patient have an upcoming appointment? Yes  Can we respond through MyChart? Yes  Agent: Please be advised that Rx refills may take up to 3 business days. We ask that you follow-up with your pharmacy.

## 2023-11-28 ENCOUNTER — Other Ambulatory Visit: Payer: Self-pay | Admitting: Family Medicine

## 2023-11-28 DIAGNOSIS — E785 Hyperlipidemia, unspecified: Secondary | ICD-10-CM

## 2023-11-28 NOTE — Telephone Encounter (Signed)
**Note De-identified  [place] Obfuscation** Please advise 

## 2023-12-01 ENCOUNTER — Telehealth: Payer: Self-pay | Admitting: Family Medicine

## 2023-12-01 NOTE — Telephone Encounter (Signed)
 Medication Refill -  Most Recent Primary Care Visit:  Provider: GASPER NANCYANN BRAVO  Department: [facility]  Visit Type: OFFICE VISIT  Date: 08/12/2023  Medication: HYDROcodone -acetaminophen  (NORCO) 7.5-325 MG tablet   Has the patient contacted their pharmacy? Yes (Agent: If no, request that the patient contact the pharmacy for the refill. If patient does not wish to contact the pharmacy document the reason why and proceed with request.) (Agent: If yes, when and what did the pharmacy advise?)  Is this the correct pharmacy for this prescription? Yes If no, delete pharmacy and type the correct one.  This is the patient's preferred pharmacy:  [facility] DR[facility] STORE #[id number] [place], [place] - [address] AT [facility] OF [address] [place] [address] Phone: [phone] Fax: [phone]  Is the patient out of the medication? No  Has the patient been seen for an appointment in the last year OR does the patient have an upcoming appointment? Yes  Can we respond through MyChart? Yes  Agent: Please be advised that Rx refills may take up to 3 business days. We ask that you follow-up with your pharmacy.

## 2023-12-01 NOTE — Telephone Encounter (Signed)
 Requested on 11/27/23 in a separate encounter, routed today to the office for the provider to review.

## 2023-12-01 NOTE — Telephone Encounter (Signed)
 Requested medication (s) are due for refill today: yes  Requested medication (s) are on the active medication list: yes  Last refill:  10/30/23  Future visit scheduled: yes  Notes to clinic:  Unable to refill per protocol, cannot delegate.      Requested Prescriptions  Pending Prescriptions Disp Refills   HYDROcodone -acetaminophen  (NORCO) 7.5-325 MG tablet 120 tablet 0    Sig: Take 1 tablet by mouth 4 (four) times daily as needed for moderate pain (pain score 4-6).     Not Delegated - Analgesics:  Opioid Agonist Combinations Failed - 12/01/2023 11:17 AM      Failed - This refill cannot be delegated      Failed - Urine Drug Screen completed in last 360 days      Failed - Valid encounter within last 3 months    Recent Outpatient Visits           3 months ago Diarrhea of presumed infectious origin   [facility] [place] Nancyann BRAVO, MD   3 months ago Diarrhea of presumed infectious origin   [place] [facility] [place], [place], PA-C   5 months ago Dysuria   [place] [facility] [place], Jon HERO, MD   6 months ago 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

## 2023-12-02 MED ORDER — HYDROCODONE-ACETAMINOPHEN 7.5-325 MG PO TABS: 1.0000 | ORAL_TABLET | Freq: Four times a day (QID) | ORAL | 0 refills | Status: DC | PRN

## 2023-12-03 ENCOUNTER — Telehealth: Payer: Self-pay | Admitting: Acute Care

## 2023-12-03 NOTE — Telephone Encounter (Signed)
 Returned call from VM message wanting to confirm if LDCT rescheduled. Do not see an active appointment for LDCT. Patient did not answer. Left her VM to call us  for scheduling

## 2023-12-08 ENCOUNTER — Telehealth: Payer: Self-pay

## 2023-12-08 NOTE — Telephone Encounter (Signed)
.  Lung Cancer Screening Narrative/Criteria Questionnaire (Cigarette Smokers Only- No Cigars/Pipes/vapes)   Alejandra Little   SDMV:completed 07/2023      Alphonsa Gin, RN   Apr 03, 1956   LDCT:12/08/2023    68 y.o.   Phone: [phone]  Lung Screening Narrative (confirm age 38-77 yrs Medicare / 50-80 yrs Private pay insurance)   Insurance information:Medicare   Referring Provider:Fisher   This screening involves an initial phone call with a team member from our program. It is called a shared decision making visit. The initial meeting is required by  insurance and Medicare to make sure you understand the program. This appointment takes about 15-20 minutes to complete. You will complete the screening scan at your scheduled date/time.  This scan takes about 5-10 minutes to complete. You can eat and drink normally before and after the scan.  Criteria questions for Lung Cancer Screening:   Are you a current or former smoker? Former Age began smoking: 13   If you are a former smoker, what year did you quit smoking? Quit 20   To calculate your smoking history, I need an accurate estimate of how many packs of cigarettes you smoked per day and for how many years. (Not just the number of PPD you are now smoking)   Years smoking 34 x Packs per day 1 = Pack years 34   (at least 20 pack yrs)   (Make sure they understand that we need to know how much they have smoked in the past, not just the number of PPD they are smoking now)  Do you have a personal history of cancer?  No    Do you have a family history of cancer? Yes  (cancer type and and relative) Mother lung sister lung father kidney   Are you coughing up blood?  No  Have you had unexplained weight loss of 15 lbs or more in the last 6 months? No  It looks like you meet all criteria.  When would be a good time for [place] to schedule you for this screening?   Additional information:

## 2023-12-10 ENCOUNTER — Telehealth (INDEPENDENT_AMBULATORY_CARE_PROVIDER_SITE_OTHER): Payer: Medicare Other | Admitting: Family Medicine

## 2023-12-10 ENCOUNTER — Ambulatory Visit: Admission: RE | Admit: 2023-12-10 | Payer: Medicare Other | Source: Ambulatory Visit

## 2023-12-10 DIAGNOSIS — E785 Hyperlipidemia, unspecified: Secondary | ICD-10-CM | POA: Diagnosis not present

## 2023-12-10 DIAGNOSIS — B9789 Other viral agents as the cause of diseases classified elsewhere: Secondary | ICD-10-CM | POA: Diagnosis not present

## 2023-12-10 DIAGNOSIS — E538 Deficiency of other specified B group vitamins: Secondary | ICD-10-CM

## 2023-12-10 DIAGNOSIS — E7800 Pure hypercholesterolemia, unspecified: Secondary | ICD-10-CM

## 2023-12-10 DIAGNOSIS — J329 Chronic sinusitis, unspecified: Secondary | ICD-10-CM | POA: Diagnosis not present

## 2023-12-10 DIAGNOSIS — J0110 Acute frontal sinusitis, unspecified: Secondary | ICD-10-CM

## 2023-12-10 MED ORDER — AMOXICILLIN 500 MG PO CAPS
1000.0000 mg | ORAL_CAPSULE | Freq: Two times a day (BID) | ORAL | 0 refills | Status: AC
Start: 2023-12-10 — End: 2023-12-20

## 2023-12-10 MED ORDER — FLUCONAZOLE 150 MG PO TABS
150.0000 mg | ORAL_TABLET | Freq: Once | ORAL | 0 refills | Status: AC
Start: 2023-12-10 — End: 2023-12-10

## 2023-12-16 ENCOUNTER — Telehealth: Payer: Self-pay | Admitting: Family Medicine

## 2023-12-16 DIAGNOSIS — K219 Gastro-esophageal reflux disease without esophagitis: Secondary | ICD-10-CM

## 2023-12-16 MED ORDER — ESOMEPRAZOLE MAGNESIUM 40 MG PO CPDR
DELAYED_RELEASE_CAPSULE | ORAL | 0 refills | Status: DC
Start: 2023-12-16 — End: 2024-06-12

## 2023-12-16 MED ORDER — ESOMEPRAZOLE MAGNESIUM 40 MG PO CPDR
DELAYED_RELEASE_CAPSULE | ORAL | 0 refills | Status: DC
Start: 2023-12-16 — End: 2023-12-16

## 2023-12-16 NOTE — Telephone Encounter (Signed)
[facility] faxed refill request for the following medications:  esomeprazole (NEXIUM) 40 MG capsule   Please advise.

## 2023-12-22 DIAGNOSIS — E7800 Pure hypercholesterolemia, unspecified: Secondary | ICD-10-CM | POA: Diagnosis not present

## 2023-12-22 DIAGNOSIS — E538 Deficiency of other specified B group vitamins: Secondary | ICD-10-CM | POA: Diagnosis not present

## 2023-12-23 ENCOUNTER — Encounter: Payer: Self-pay | Admitting: Family Medicine

## 2023-12-23 LAB — COMPREHENSIVE METABOLIC PANEL
ALT: 11 [IU]/L (ref 0–32)
AST: 13 [IU]/L (ref 0–40)
Albumin: 4.1 g/dL (ref 3.9–4.9)
Alkaline Phosphatase: 56 [IU]/L (ref 44–121)
BUN/Creatinine Ratio: 21 (ref 12–28)
BUN: 17 mg/dL (ref 8–27)
Bilirubin Total: 0.3 mg/dL (ref 0.0–1.2)
CO2: 28 mmol/L (ref 20–29)
Calcium: 9.7 mg/dL (ref 8.7–10.3)
Chloride: 104 mmol/L (ref 96–106)
Creatinine, Ser: 0.8 mg/dL (ref 0.57–1.00)
Globulin, Total: 2.1 g/dL (ref 1.5–4.5)
Glucose: 97 mg/dL (ref 70–99)
Potassium: 4.4 mmol/L (ref 3.5–5.2)
Sodium: 144 mmol/L (ref 134–144)
Total Protein: 6.2 g/dL (ref 6.0–8.5)
eGFR: 80 mL/min/{1.73_m2} (ref >59)

## 2023-12-23 LAB — LIPID PANEL
Chol/HDL Ratio: 3.3 {ratio} (ref 0.0–4.4)
Cholesterol, Total: 171 mg/dL (ref 100–199)
HDL: 52 mg/dL (ref >39)
LDL Chol Calc (NIH): 97 mg/dL (ref 0–99)
Triglycerides: 121 mg/dL (ref 0–149)
VLDL Cholesterol Cal: 22 mg/dL (ref 5–40)

## 2023-12-23 LAB — CBC
Hematocrit: 40.5 % (ref 34.0–46.6)
Hemoglobin: 13.4 g/dL (ref 11.1–15.9)
MCH: 30.5 pg (ref 26.6–33.0)
MCHC: 33.1 g/dL (ref 31.5–35.7)
MCV: 92 fL (ref 79–97)
Platelets: 147 10*3/uL — ABNORMAL LOW (ref 150–450)
RBC: 4.39 x10E6/uL (ref 3.77–5.28)
RDW: 12.8 % (ref 11.7–15.4)
WBC: 5.8 10*3/uL (ref 3.4–10.8)

## 2023-12-23 LAB — VITAMIN B12: Vitamin B-12: 599 pg/mL (ref 232–1245)

## 2023-12-28 ENCOUNTER — Other Ambulatory Visit: Payer: Self-pay | Admitting: Family Medicine

## 2023-12-28 DIAGNOSIS — F419 Anxiety disorder, unspecified: Secondary | ICD-10-CM

## 2024-01-01 ENCOUNTER — Other Ambulatory Visit: Payer: Self-pay | Admitting: Family Medicine

## 2024-01-01 NOTE — Telephone Encounter (Signed)
Medication Refill -  Most Recent Primary Care Visit:  Provider: Malva Limes  Department: [facility]  Visit Type: MYCHART VIDEO VISIT  Date: 12/10/2023  Medication: HYDROcodone-acetaminophen (NORCO) 7.5-325 MG tablet   Has the patient contacted their pharmacy? Yes (Agent: If no, request that the patient contact the pharmacy for the refill. If patient does not wish to contact the pharmacy document the reason why and proceed with request.) (Agent: If yes, when and what did the pharmacy advise?)  Is this the correct pharmacy for this prescription? Yes If no, delete pharmacy and type the correct one.  This is the patient's preferred pharmacy:   [facility] DR[facility] STORE #[id number] [place], [place] - [address] AT [facility] OF [address] [place] [address] Phone: [phone] Fax: [phone]   Has the prescription been filled recently? Yes  Is the patient out of the medication? No  Has the patient been seen for an appointment in the last year OR does the patient have an upcoming appointment? Yes  Can we respond through MyChart? Yes  Agent: Please be advised that Rx refills may take up to 3 business days. We ask that you follow-up with your pharmacy.

## 2024-01-02 NOTE — Telephone Encounter (Signed)
Requested medication (s) are due for refill today: routing for approval  Requested medication (s) are on the active medication list: yes  Last refill:  12/02/23  Future visit scheduled: no  Notes to clinic:  Unable to refill per protocol, cannot delegate.      Requested Prescriptions  Pending Prescriptions Disp Refills   HYDROcodone-acetaminophen (NORCO) 7.5-325 MG tablet 120 tablet 0    Sig: Take 1 tablet by mouth 4 (four) times daily as needed for moderate pain (pain score 4-6).     Not Delegated - Analgesics:  Opioid Agonist Combinations Failed - 01/02/2024  1:11 PM      Failed - This refill cannot be delegated      Failed - Urine Drug Screen completed in last 360 days      Passed - Valid encounter within last 3 months    Recent Outpatient Visits           3 weeks ago Acute non-recurrent frontal sinusitis   [facility] Malva Limes, MD   4 months ago Diarrhea of presumed infectious origin   [facility] Malva Limes, MD   4 months ago Diarrhea of presumed infectious origin   [place] [place], [place], PA-C   6 months ago Dysuria   [place] [facility] [place], Marzella Schlein, MD   8 months ago 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

## 2024-01-04 MED ORDER — HYDROCODONE-ACETAMINOPHEN 7.5-325 MG PO TABS: 1.0000 | ORAL_TABLET | Freq: Four times a day (QID) | ORAL | 0 refills | Status: DC | PRN

## 2024-01-05 ENCOUNTER — Telehealth: Payer: Self-pay | Admitting: Family Medicine

## 2024-01-05 DIAGNOSIS — E669 Obesity, unspecified: Secondary | ICD-10-CM

## 2024-01-05 NOTE — Telephone Encounter (Signed)
 Pt requesting to get started on injections for diabetes. Pt states she had discussed this previously with Dr. Sherrie Mustache and was told to wait until this year so that insurance could cover the medication.   Pt requesting callback with outcome, [phone]

## 2024-01-06 NOTE — Telephone Encounter (Signed)
 Does she mean one of the injectable medications for weight loss? If so, I don't know for sure if these are covered on her insurance, but I can send prescription to pharmacy to find out.

## 2024-01-07 MED ORDER — WEGOVY 0.25 MG/0.5ML ~~LOC~~ SOAJ
SUBCUTANEOUS | 1 refills | Status: DC
Start: 2024-01-07 — End: 2024-08-23

## 2024-01-07 NOTE — Telephone Encounter (Signed)
 Spoke with patient and yes it is for weight loss and yes to send to her pharmacy.

## 2024-01-30 ENCOUNTER — Other Ambulatory Visit: Payer: Self-pay | Admitting: Family Medicine

## 2024-01-30 NOTE — Telephone Encounter (Signed)
 Copied from CRM [id number]. Topic: Clinical - Medication Refill >> Jan 30, 2024  4:24 PM Higinio Roger wrote: Most Recent Primary Care Visit:  Provider: Malva Limes  [facility]  Visit Type: MYCHART VIDEO VISIT  Date: 12/10/2023  Medication: HYDROcodone-acetaminophen (NORCO) 7.5-325 MG tablet  Has the patient contacted their pharmacy? Yes (Agent: If no, request that the patient contact the pharmacy for the refill. If patient does not wish to contact the pharmacy document the reason why and proceed with request.) (Agent: If yes, when and what did the pharmacy advise?)  Is this the correct pharmacy for this prescription? Yes If no, delete pharmacy and type the correct one.  This is the patient's preferred pharmacy:  [facility] [address] [place] - [address] [place] [address] Phone: [phone] Fax: [phone]  [facility] - [place], [place] - [address] [place] [address] Phone: [phone] Fax: [phone]  [id number] [place], [place] - [address] [place] [address] Phone: [phone] Fax: [phone]  [facility] #[id number] [place], [place] - [address] AT [facility] [address] [place] [address] Phone: [phone] Fax: [phone]   Has the prescription been filled recently? Yes  Is the patient out of the medication? Yes  Has the patient been seen for an appointment in the last year OR does the patient have an upcoming appointment? Yes  Can we respond through MyChart? Yes  Agent: Please be advised that Rx refills may take up to 3 business days. We ask that you follow-up with your pharmacy.

## 2024-02-02 ENCOUNTER — Other Ambulatory Visit: Payer: Self-pay | Admitting: Family Medicine

## 2024-02-02 DIAGNOSIS — F419 Anxiety disorder, unspecified: Secondary | ICD-10-CM

## 2024-02-02 MED ORDER — HYDROCODONE-ACETAMINOPHEN 7.5-325 MG PO TABS: 1.0000 | ORAL_TABLET | Freq: Four times a day (QID) | ORAL | 0 refills | Status: DC | PRN

## 2024-02-02 NOTE — Telephone Encounter (Signed)
  Pt is requesting med sent to alternate pharmacy   Copied from CRM [id number]. Topic: Clinical - Prescription Issue >> Feb 02, 2024  5:37 PM Victorino Dike T wrote: Reason for CRM: HYDROcodone-acetaminophen (NORCO) 7.5-325 MG tablet- [facility]

## 2024-02-02 NOTE — Telephone Encounter (Signed)
 Requested medication (s) are due for refill today - yes  Requested medication (s) are on the active medication list -yes  Future visit scheduled -no  Last refill: 01/04/24 #120  Notes to clinic: non delegated Rx  Requested Prescriptions  Pending Prescriptions Disp Refills   HYDROcodone-acetaminophen (NORCO) 7.5-325 MG tablet 120 tablet 0    Sig: Take 1 tablet by mouth 4 (four) times daily as needed for moderate pain (pain score 4-6).     Not Delegated - Analgesics:  Opioid Agonist Combinations Failed - 02/02/2024 10:30 AM      Failed - This refill cannot be delegated      Failed - Urine Drug Screen completed in last 360 days      Passed - Valid encounter within last 3 months    Recent Outpatient Visits           1 month ago Acute non-recurrent frontal sinusitis   [facility] Malva Limes, MD   5 months ago Diarrhea of presumed infectious origin   [facility] Malva Limes, MD   6 months ago Diarrhea of presumed infectious origin   [facility] [place], [place], PA-C   7 months ago Dysuria   [place] [facility] [place], Marzella Schlein, MD   9 months ago 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     Not Delegated - Analgesics:  Opioid Agonist Combinations Failed - 02/02/2024 10:30 AM      Failed - This refill cannot be delegated      Failed - Urine Drug Screen completed in last 360 days      Passed - Valid encounter within last 3 months    Recent Outpatient Visits           1 month ago Acute non-recurrent frontal sinusitis   [facility], [facility]   5 months ago Diarrhea of presumed infectious origin   [facility], MD   6 months ago Diarrhea of presumed infectious origin   [facility] Van, [place], PA-C   7 months ago Dysuria   [place] [facility] [place], Marzella Schlein, MD   9 months ago 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

## 2024-02-03 MED ORDER — HYDROCODONE-ACETAMINOPHEN 7.5-325 MG PO TABS: 1.0000 | ORAL_TABLET | Freq: Four times a day (QID) | ORAL | 0 refills | Status: DC | PRN

## 2024-03-01 NOTE — Telephone Encounter (Signed)
 Copied from CRM [id number]. Topic: Clinical - Medication Refill >> Mar 01, 2024 10:07 AM [name] wrote: Most Recent Primary Care Visit:  Provider: Lamon Pillow  [facility]  Visit Type: MYCHART VIDEO VISIT  Date: 12/10/2023  Medication: ALPRAZolam (XANAX) 0.5 MG tablet and HYDROcodone-acetaminophen (NORCO) 7.5-325 MG tablet   Has the patient contacted their pharmacy? No (Agent: If no, request that the patient contact the pharmacy for the refill. If patient does not wish to contact the pharmacy document the reason why and proceed with request.) (Agent: If yes, when and what did the pharmacy advise?)  Is this the correct pharmacy for this prescription? Yes If no, delete pharmacy and type the correct one.  This is the patient's preferred pharmacy:  [facility] [address] [place] - [address] [name] [address] Phone: [phone] Fax: [phone]  [facility] - [name], [place] - [address] [place] [address] Phone: [phone] Fax: [phone]  [id number] [place], [place] - [address] [place] [address] Phone: [phone] Fax: [phone]  [name][id number] [name], [place] - [address] AT [facility] OF [name] [address] Phone: [phone] Fax: [phone]   Has the prescription been filled recently? No  Is the patient out of the medication? No  Has the patient been seen for an appointment in the last year OR does the patient have an upcoming appointment? Yes  Can we respond through MyChart? Yes  Agent: Please be advised that Rx refills may take up to 3 business days. We ask that you follow-up with your pharmacy.  Patient requesting to let the pharamcy know they wont have insurance until next month and to put it on [facility] for now.

## 2024-03-08 MED ORDER — HYDROCODONE-ACETAMINOPHEN 7.5-325 MG PO TABS: 1.0000 | ORAL_TABLET | Freq: Four times a day (QID) | ORAL | 0 refills | Status: DC | PRN

## 2024-03-08 MED ORDER — ALPRAZOLAM 0.5 MG PO TABS
0.5000 mg | ORAL_TABLET | Freq: Every day | ORAL | 3 refills | Status: DC
Start: 2024-03-08 — End: 2024-08-23

## 2024-03-08 NOTE — Addendum Note (Signed)
 Addended by: Erminia Hazel on: 03/08/2024 09:55 AM   Modules accepted: Orders

## 2024-03-08 NOTE — Telephone Encounter (Signed)
 Patient calling to check on request for rx refill.   Patient states she is needing rx to go to [facility] #[id number] [name], [place] - [address] AT [facility] OF [name] [place] [address] Phone: [phone] Fax: [phone]   Patient states she is needing it to go through [facility]/ [facility] so that she may get it at a discounted price. Patient informed it may be better for provider to include that in rx.

## 2024-03-08 NOTE — Telephone Encounter (Signed)
 Requested medication (s) are due for refill today: Yes  Requested medication (s) are on the active medication list: Yes  Last refill:  02/03/24, 12/30/23  Future visit scheduled: Yes  Notes to clinic:  Unable to refill per protocol, cannot delegate.      Requested Prescriptions  Pending Prescriptions Disp Refills   ALPRAZolam  (XANAX ) 0.5 MG tablet 45 tablet 3    Sig: Take 1-2 tablets (0.5-1 mg total) by mouth at bedtime.     Not Delegated - Psychiatry: Anxiolytics/Hypnotics 2 Failed - 03/08/2024  9:55 AM      Failed - This refill cannot be delegated      Failed - Urine Drug Screen completed in last 360 days      Failed - Valid encounter within last 6 months    Recent Outpatient Visits   None            Passed - Patient is not pregnant       HYDROcodone -acetaminophen  (NORCO) 7.5-325 MG tablet 120 tablet 0    Sig: Take 1 tablet by mouth 4 (four) times daily as needed for moderate pain (pain score 4-6).     Not Delegated - Analgesics:  Opioid Agonist Combinations Failed - 03/08/2024  9:55 AM      Failed - This refill cannot be delegated      Failed - Urine Drug Screen completed in last 360 days      Failed - Valid encounter within last 3 months    Recent Outpatient Visits   None            Signed Prescriptions Disp Refills   HYDROcodone -acetaminophen  (NORCO) 7.5-325 MG tablet 120 tablet 0    Sig: Take 1 tablet by mouth 4 (four) times daily as needed for moderate pain (pain score 4-6).     There is no refill protocol information for this order

## 2024-03-19 ENCOUNTER — Other Ambulatory Visit: Payer: Self-pay | Admitting: Family Medicine

## 2024-03-19 DIAGNOSIS — E785 Hyperlipidemia, unspecified: Secondary | ICD-10-CM

## 2024-03-19 NOTE — Telephone Encounter (Signed)
 Copied from CRM [id number]. Topic: Clinical - Medication Refill >> Mar 19, 2024  1:25 PM Star East wrote: Most Recent Primary Care Visit:  Provider: Lamon Pillow  [facility]  Visit Type: MYCHART VIDEO VISIT  Date: 12/10/2023  Medication: pravastatin  (PRAVACHOL ) 40 MG tablet  Has the patient contacted their pharmacy? No (Agent: If no, request that the patient contact the pharmacy for the refill. If patient does not wish to contact the pharmacy document the reason why and proceed with request.) (Agent: If yes, when and what did the pharmacy advise?)  Is this the correct pharmacy for this prescription? Yes If no, delete pharmacy and type the correct one.  This is the patient's preferred pharmacy:   [facility] #[id number] [name], [place] - [address] AT [facility] OF [name] [place] [address] Phone: [phone] Fax: [phone]   Has the prescription been filled recently? Yes  Is the patient out of the medication? No  Has the patient been seen for an appointment in the last year OR does the patient have an upcoming appointment? Yes  Can we respond through MyChart? Yes  Agent: Please be advised that Rx refills may take up to 3 business days. We ask that you follow-up with your pharmacy.

## 2024-03-23 ENCOUNTER — Other Ambulatory Visit: Payer: Self-pay

## 2024-03-23 DIAGNOSIS — E785 Hyperlipidemia, unspecified: Secondary | ICD-10-CM

## 2024-03-23 MED ORDER — PRAVASTATIN SODIUM 40 MG PO TABS: ORAL_TABLET | ORAL | 0 refills | Status: DC

## 2024-03-23 NOTE — Telephone Encounter (Signed)
 Requested Prescriptions  Pending Prescriptions Disp Refills   pravastatin  (PRAVACHOL ) 40 MG tablet 90 tablet 0    Sig: TAKE 1 TABLET BY MOUTH EVERYDAY AT BEDTIME     Cardiovascular:  Antilipid - Statins Failed - 03/23/2024  7:45 AM      Failed - Valid encounter within last 12 months    Recent Outpatient Visits   None            Failed - Lipid Panel in normal range within the last 12 months    Cholesterol, Total  Date Value Ref Range Status  12/22/2023 171 100 - 199 mg/dL Final   LDL Chol Calc (NIH)  Date Value Ref Range Status  12/22/2023 97 0 - 99 mg/dL Final   HDL  Date Value Ref Range Status  12/22/2023 52 >39 mg/dL Final   Triglycerides  Date Value Ref Range Status  12/22/2023 121 0 - 149 mg/dL Final         Passed - Patient is not pregnant

## 2024-03-23 NOTE — Telephone Encounter (Signed)
Rx sent to [facility] pharmacy.  

## 2024-03-23 NOTE — Telephone Encounter (Signed)
 Copied from CRM [id number]. Topic: Clinical - Prescription Issue >> Mar 23, 2024  9:42 AM Zina Hilts wrote: Reason for CRM: Patient called and stated her rx for her  pravastatin  (PRAVACHOL ) 40 MG tablet 90 tablets wre sent to the incorrect pharmacy. It needs to go to the [facility] in order for her insurance to pay for the rx  [facility] #[id number] [name], [place] - [address] AT [facility] [name] [place] [address] Phone: [phone] Fax: [phone] Hours: Not open 24 hours

## 2024-04-07 ENCOUNTER — Other Ambulatory Visit: Payer: Self-pay | Admitting: Family Medicine

## 2024-04-07 DIAGNOSIS — F419 Anxiety disorder, unspecified: Secondary | ICD-10-CM

## 2024-04-07 MED ORDER — HYDROCODONE-ACETAMINOPHEN 7.5-325 MG PO TABS: 1.0000 | ORAL_TABLET | Freq: Four times a day (QID) | ORAL | 0 refills | Status: DC | PRN

## 2024-04-07 NOTE — Telephone Encounter (Signed)
 Requested medication (s) are due for refill today - yes  Requested medication (s) are on the active medication list -yes  Future visit scheduled -yes  Last refill: 03/08/24 #120  Notes to clinic: non delegated Rx  Requested Prescriptions  Pending Prescriptions Disp Refills   HYDROcodone -acetaminophen  (NORCO) 7.5-325 MG tablet 120 tablet 0    Sig: Take 1 tablet by mouth 4 (four) times daily as needed for moderate pain (pain score 4-6).     Not Delegated - Analgesics:  Opioid Agonist Combinations Failed - 04/07/2024  2:56 PM      Failed - This refill cannot be delegated      Failed - Urine Drug Screen completed in last 360 days      Failed - Valid encounter within last 3 months    Recent Outpatient Visits   None               Requested Prescriptions  Pending Prescriptions Disp Refills   HYDROcodone -acetaminophen  (NORCO) 7.5-325 MG tablet 120 tablet 0    Sig: Take 1 tablet by mouth 4 (four) times daily as needed for moderate pain (pain score 4-6).     Not Delegated - Analgesics:  Opioid Agonist Combinations Failed - 04/07/2024  2:56 PM      Failed - This refill cannot be delegated      Failed - Urine Drug Screen completed in last 360 days      Failed - Valid encounter within last 3 months    Recent Outpatient Visits   None

## 2024-04-07 NOTE — Telephone Encounter (Signed)
 Copied from CRM [id number]. Topic: Clinical - Medication Refill >> Apr 07, 2024  1:31 PM Ameerah G wrote: Medication:  ALPRAZolam  (XANAX ) 0.5 MG tablet  HYDROcodone -acetaminophen  (NORCO) 7.5-325 MG tablet  Has the patient contacted their pharmacy? No (Agent: If no, request that the patient contact the pharmacy for the refill. If patient does not wish to contact the pharmacy document the reason why and proceed with request.) (Agent: If yes, when and what did the pharmacy advise?)  This is the patient's preferred pharmacy:  [facility] #[id number] [name], [place] - [address] AT [facility] OF [address] [place] [address] Phone: [phone] Fax: [phone]  Is this the correct pharmacy for this prescription? Yes If no, delete pharmacy and type the correct one.   Has the prescription been filled recently? No  Is the patient out of the medication? No for both 4-5 days left  Has the patient been seen for an appointment in the last year OR does the patient have an upcoming appointment? Yes  Can we respond through MyChart? Yes  Agent: Please be advised that Rx refills may take up to 3 business days. We ask that you follow-up with your pharmacy.

## 2024-04-07 NOTE — Telephone Encounter (Signed)
 Norco last sent 03/08/24 for 120 tabs take 1 four times daily prn, zero refills  Xanax  last sent 03/08/24 45 tabs take 1-2 tabs bedtime, 3 refills-not pended due to refills remain  Last appointment-video visit 12/10/23 Next appointment-AWV 05/12/24

## 2024-04-13 ENCOUNTER — Other Ambulatory Visit: Payer: Self-pay | Admitting: Family Medicine

## 2024-04-13 DIAGNOSIS — F419 Anxiety disorder, unspecified: Secondary | ICD-10-CM

## 2024-04-13 NOTE — Telephone Encounter (Unsigned)
 Copied from CRM [id number]. Topic: Clinical - Medication Refill >> Apr 13, 2024 11:22 AM Felizardo Hotter wrote: Medication: ALPRAZolam  (XANAX ) 0.5 MG tablet  Has the patient contacted their pharmacy? Yes (Agent: If no, request that the patient contact the pharmacy for the refill. If patient does not wish to contact the pharmacy document the reason why and proceed with request.) (Agent: If yes, when and what did the pharmacy advise?)Pharmacy need PCP approval  This is the patient's preferred pharmacy:  [facility] #[id number] [name], [place] - [address] AT [facility] OF [address] [place] [address] Phone: [phone] Fax: [phone]  Is this the correct pharmacy for this prescription? Yes If no, delete pharmacy and type the correct one.   Has the prescription been filled recently? Yes  Is the patient out of the medication? Yes  Has the patient been seen for an appointment in the last year OR does the patient have an upcoming appointment? Yes  Can we respond through MyChart? Yes  Agent: Please be advised that Rx refills may take up to 3 business days. We ask that you follow-up with your pharmacy.

## 2024-04-15 NOTE — Telephone Encounter (Signed)
 Too soon for refill.  Requested Prescriptions  Pending Prescriptions Disp Refills   ALPRAZolam  (XANAX ) 0.5 MG tablet 45 tablet 3    Sig: Take 1-2 tablets (0.5-1 mg total) by mouth at bedtime.     Not Delegated - Psychiatry: Anxiolytics/Hypnotics 2 Failed - 04/15/2024  3:53 PM      Failed - This refill cannot be delegated      Failed - Urine Drug Screen completed in last 360 days      Failed - Valid encounter within last 6 months    Recent Outpatient Visits   None            Passed - Patient is not pregnant

## 2024-04-28 DIAGNOSIS — M1711 Unilateral primary osteoarthritis, right knee: Secondary | ICD-10-CM | POA: Diagnosis not present

## 2024-05-07 ENCOUNTER — Other Ambulatory Visit: Payer: Self-pay

## 2024-05-07 ENCOUNTER — Other Ambulatory Visit: Payer: Self-pay | Admitting: Family Medicine

## 2024-05-07 DIAGNOSIS — F419 Anxiety disorder, unspecified: Secondary | ICD-10-CM

## 2024-05-09 MED ORDER — FLUOXETINE HCL 20 MG PO CAPS: 60.0000 mg | ORAL_CAPSULE | Freq: Every day | ORAL | 2 refills | Status: AC

## 2024-05-11 ENCOUNTER — Other Ambulatory Visit: Payer: Self-pay | Admitting: Family Medicine

## 2024-05-11 MED ORDER — HYDROCODONE-ACETAMINOPHEN 7.5-325 MG PO TABS: 1.0000 | ORAL_TABLET | Freq: Four times a day (QID) | ORAL | 0 refills | Status: DC | PRN

## 2024-05-11 NOTE — Telephone Encounter (Signed)
 Requested medication (s) are due for refill today: yes  Requested medication (s) are on the active medication list: yes  Last refill:  04/07/24  Future visit scheduled: yes  Notes to clinic:  Unable to refill per protocol, cannot delegate.      Requested Prescriptions  Pending Prescriptions Disp Refills   HYDROcodone -acetaminophen  (NORCO) 7.5-325 MG tablet 120 tablet 0    Sig: Take 1 tablet by mouth 4 (four) times daily as needed for moderate pain (pain score 4-6).     Not Delegated - Analgesics:  Opioid Agonist Combinations Failed - 05/11/2024  8:24 AM      Failed - This refill cannot be delegated      Failed - Urine Drug Screen completed in last 360 days      Failed - Valid encounter within last 3 months    Recent Outpatient Visits   None

## 2024-05-11 NOTE — Telephone Encounter (Signed)
 Copied from CRM [id number]. Topic: Clinical - Medication Refill >> May 07, 2024 10:58 AM Tonda B wrote: Medication: HYDROcodone -acetaminophen  (NORCO) 7.5-325 MG tablet [[id number]]  Has the patient contacted their pharmacy? Yes (Agent: If no, request that the patient contact the pharmacy for the refill. If patient does not wish to contact the pharmacy document the reason why and proceed with request.) (Agent: If yes, when and what did the pharmacy advise?)  This is the patient's preferred pharmacy:  [facility] DR[facility] STORE #[id number] [place], [place] - [address] AT [facility] OF [address] [place] [address] Phone: [phone] Fax: [phone]   Is this the correct pharmacy for this prescription? Yes If no, delete pharmacy and type the correct one.   Has the prescription been filled recently? Yes  Is the patient out of the medication? Yes  Has the patient been seen for an appointment in the last year OR does the patient have an upcoming appointment? Yes  Can we respond through MyChart? Yes  Agent: Please be advised that Rx refills may take up to 3 business days. We ask that you follow-up with your pharmacy.

## 2024-05-12 ENCOUNTER — Ambulatory Visit (INDEPENDENT_AMBULATORY_CARE_PROVIDER_SITE_OTHER): Payer: Medicare (Managed Care)

## 2024-05-12 DIAGNOSIS — Z0000 Encounter for general adult medical examination without abnormal findings: Secondary | ICD-10-CM | POA: Diagnosis not present

## 2024-05-12 NOTE — Progress Notes (Signed)
 Subjective:   Alejandra Little is a 68 y.o. who presents for a Medicare Wellness preventive visit.  As a reminder, Annual Wellness Visits don't include a physical exam, and some assessments may be limited, especially if this visit is performed virtually. We may recommend an in-person follow-up visit with your provider if needed.  Visit Complete: Virtual I connected with  Alejandra Little on 05/12/24 by a audio enabled telemedicine application and verified that I am speaking with the correct person using two identifiers.  Patient Location: Home  Provider Location: Home Office  I discussed the limitations of evaluation and management by telemedicine. The patient expressed understanding and agreed to proceed.  Vital Signs: Because this visit was a virtual/telehealth visit, some criteria may be missing or patient reported. Any vitals not documented were not able to be obtained and vitals that have been documented are patient reported.  VideoDeclined- This patient declined Librarian, academic. Therefore the visit was completed with audio only.  Persons Participating in Visit: Patient.  AWV Questionnaire: No: Patient Medicare AWV questionnaire was not completed prior to this visit.  Cardiac Risk Factors include: advanced age (>90men, >46 women);hypertension;dyslipidemia;obesity (BMI >30kg/m2)     Objective:    Today's Vitals   05/12/24 1355  PainSc: 4    There is no height or weight on file to calculate BMI.     05/12/2024    2:04 PM 07/03/2023    7:13 AM 06/19/2023    7:50 AM 05/12/2023    2:09 PM 08/17/2020    8:57 AM 05/08/2020    9:54 AM 12/24/2019    3:13 PM  Advanced Directives  Does Patient Have a Medical Advance Directive? No No No No No No No  Would patient like information on creating a medical advance directive? No - Patient declined No - Patient declined No - Patient declined  No - Patient declined No - Patient declined No - Patient declined     Current Medications (verified) Outpatient Encounter Medications as of 05/12/2024  Medication Sig   ALPRAZolam  (XANAX ) 0.5 MG tablet Take 1-2 tablets (0.5-1 mg total) by mouth at bedtime.   cyanocobalamin  (VITAMIN B12) 1000 MCG tablet Take 1,000 mcg by mouth daily.   diphenoxylate -atropine  (LOMOTIL ) 2.5-0.025 MG tablet Take 1-2 tablets by mouth 2 (two) times daily as needed for diarrhea or loose stools.   esomeprazole  (NEXIUM ) 40 MG capsule TAKE 1 CAPSULE BY MOUTH EVERY DAY   FLUoxetine (PROZAC) 20 MG capsule Take 3 capsules (60 mg total) by mouth daily.   gabapentin  (NEURONTIN ) 300 MG capsule TAKE 1 CAPSULE (300MG ) BY MOUTH THREE TIMES DAILY AND TAKE 2 CAPSULES (600MG ) BY MOUTH AT BEDTIME   HYDROcodone -acetaminophen  (NORCO) 7.5-325 MG tablet Take 1 tablet by mouth 4 (four) times daily as needed for moderate pain (pain score 4-6).   meclizine  (ANTIVERT ) 25 MG tablet Take 1 tablet (25 mg total) by mouth 3 (three) times daily as needed for dizziness.   meloxicam  (MOBIC ) 7.5 MG tablet TAKE 1 TABLET BY MOUTH EVERY DAY   pravastatin  (PRAVACHOL ) 40 MG tablet TAKE 1 TABLET BY MOUTH EVERYDAY AT BEDTIME   Specialty Vitamins Products ([employer] HAIR/SKIN/NAILS) TABS Take 1 tablet by mouth daily.    meclizine  (ANTIVERT ) 25 MG tablet Take 1 tablet (25 mg total) by mouth 3 (three) times daily as needed for dizziness.   Semaglutide -Weight Management (WEGOVY ) 0.25 MG/0.5ML SOAJ Inject 0.25mg  once a week for 4 weeks, then increase to 0.5mg  (Patient not taking: Reported on 05/12/2024)   [  DISCONTINUED] HYDROcodone -acetaminophen  (NORCO) 7.5-325 MG tablet Take 1 tablet by mouth 4 (four) times daily as needed for moderate pain (pain score 4-6).   No facility-administered encounter medications on file as of 05/12/2024.    Allergies (verified) Dexilant  [dexlansoprazole ]   History: Past Medical History:  Diagnosis Date   Anxiety    Arthritis    Chronic back pain    Colon polyp 04/05/2015   Colon polyps    Dog  tapeworm infection 01/11/2016   GERD (gastroesophageal reflux disease)    Giardia 01/11/2016   Hyperlipidemia    OSA on CPAP    Sleep apnea    Past Surgical History:  Procedure Laterality Date   ABDOMINAL HYSTERECTOMY  11/18/1997   Fibroids   CATARACT EXTRACTION W/PHACO Left 06/19/2023   Procedure: CATARACT EXTRACTION PHACO AND INTRAOCULAR LENS PLACEMENT (IOC)  LEFT 8.30 00:58.3;  Surgeon: Enola Feliciano Hugger, MD;  Location: [facility];  Service: Ophthalmology;  Laterality: Left;   CATARACT EXTRACTION W/PHACO Right 07/03/2023   Procedure: CATARACT EXTRACTION PHACO AND INTRAOCULAR LENS PLACEMENT (IOC) RIGHT  4.32  00:39.9;  Surgeon: Enola Feliciano Hugger, MD;  Location: [facility];  Service: Ophthalmology;  Laterality: Right;   CHOLECYSTECTOMY  11/18/2002   COLONOSCOPY WITH PROPOFOL  N/A 08/17/2020   Procedure: COLONOSCOPY WITH PROPOFOL ;  Surgeon: Janalyn Keene NOVAK, MD;  Location: [facility];  Service: Gastroenterology;  Laterality: N/A;   LITHOTRIPSY     REDUCTION MAMMAPLASTY     2008   TOTAL HIP ARTHROPLASTY Left 10/2021   Dr. Leora   Family History  Problem Relation Age of Onset   Lung cancer Mother    Lung cancer Father    Hypertension Sister    COPD Sister    COPD Sister    Breast cancer Paternal Aunt    Social History   Socioeconomic History   Marital status: Divorced    Spouse name: Not on file   Number of children: 3   Years of education: 8   Highest education level: 8th grade  Occupational History   Occupation: Disability  Tobacco Use   Smoking status: Former    Current packs/day: 1.00    Average packs/day: 1 pack/day for 52.0 years (52.0 ttl pk-yrs)    Types: Cigarettes   Smokeless tobacco: Never   Tobacco comments:    Quit 11/18/2020  Vaping Use   Vaping status: Never Used  Substance and Sexual Activity   Alcohol  use: No   Drug use: Never   Sexual activity: Yes  Other Topics Concern   Not on file  Social History Narrative    Not on file   Social Drivers of Health   Financial Resource Strain: Low Risk  (05/12/2024)   Overall Financial Resource Strain (CARDIA)    Difficulty of Paying Living Expenses: Not hard at all  Food Insecurity: No Food Insecurity (05/12/2024)   Hunger Vital Sign    Worried About Running Out of Food in the Last Year: Never true    Ran Out of Food in the Last Year: Never true  Transportation Needs: No Transportation Needs (05/12/2024)   PRAPARE - Administrator, Civil Service (Medical): No    Lack of Transportation (Non-Medical): No  Physical Activity: Insufficiently Active (05/12/2024)   Exercise Vital Sign    Days of Exercise per Week: 3 days    Minutes of Exercise per Session: 20 min  Stress: No Stress Concern Present (05/12/2024)   [employer] of Occupational Health - Occupational Stress Questionnaire  Feeling of Stress: Not at all  Social Connections: Socially Isolated (05/12/2024)   Social Connection and Isolation Panel    Frequency of Communication with Friends and Family: More than three times a week    Frequency of Social Gatherings with Friends and Family: More than three times a week    Attends Religious Services: Never    Database administrator or Organizations: No    Attends Banker Meetings: Never    Marital Status: Divorced    Tobacco Counseling Counseling given: Not Answered Tobacco comments: Quit 11/18/2020    Clinical Intake:  Pre-visit preparation completed: Yes  Pain : 0-10 Pain Score: 4  Pain Type: Chronic pain Pain Location: Back Pain Orientation: Lower Pain Descriptors / Indicators: Aching, Discomfort, Constant Pain Onset: More than a month ago Pain Frequency: Constant     BMI - recorded: 34.8 Nutritional Status: BMI > 30  Obese Nutritional Risks: None Diabetes: No  Lab Results  Component Value Date   HGBA1C 6.1 (H) 02/28/2016   HGBA1C 5.7 (H) 06/07/2015   HGBA1C 5.8 11/30/2012     How often do you need to  have someone help you when you read instructions, pamphlets, or other written materials from your doctor or pharmacy?: 1 - Never  Interpreter Needed?: No  Information entered by :: JHONNIE DAS, LPN   Activities of Daily Living    05/12/2024    2:08 PM 07/03/2023    7:06 AM  In your present state of health, do you have any difficulty performing the following activities:  Hearing? 0 0  Vision? 0 0  Difficulty concentrating or making decisions? 0 0  Walking or climbing stairs? 1 0  Comment KNEE PAIN   Dressing or bathing? 0 0  Doing errands, shopping? 0   Preparing Food and eating ? N   Using the Toilet? N   In the past six months, have you accidently leaked urine? N   Do you have problems with loss of bowel control? N   Managing your Medications? N   Managing your Finances? N   Housekeeping or managing your Housekeeping? N     Patient Care Team: Gasper Nancyann BRAVO, MD as PCP - General (Family Medicine) Joshua Darold RIGGERS as Physician Assistant (Orthopedic Surgery) Leora Lynwood SAUNDERS, MD as Consulting Physician (Orthopedic Surgery) Pa, [facility] (Optometry)  I have updated your Care Teams any recent Medical Services you may have received from other providers in the past year.     Assessment:   This is a routine wellness examination for Kend[place]ynn.  Hearing/Vision screen Hearing Screening - Comments:: NO AIDS Vision Screening - Comments:: [facility]   Goals Addressed             This Visit's Progress    DIET - EAT MORE FRUITS AND VEGETABLES       DIET - INCREASE WATER INTAKE         Depression Screen     05/12/2024    1:59 PM 08/06/2023   10:52 AM 06/26/2023   10:52 AM 05/12/2023    2:07 PM 05/06/2023   11:04 AM 02/28/2023    1:16 PM 12/02/2022   10:41 AM  PHQ 2/9 Scores  PHQ - 2 Score 1 4 5 2 6 6 4   PHQ- 9 Score 2 11 10  10 9 10     Fall Risk     05/12/2024    2:07 PM 06/26/2023   10:52 AM 05/12/2023    2:03  PM 05/06/2023   11:04 AM 02/28/2023     1:16 PM  Fall Risk   Falls in the past year? 1 0 0 0 0  Number falls in past yr: 0 0 0 0 0  Injury with Fall? 1 0 0 0 0  Risk for fall due to :  No Fall Risks No Fall Risks No Fall Risks   Follow up Falls evaluation completed;Falls prevention discussed Falls evaluation completed Education provided;Falls prevention discussed Falls evaluation completed     MEDICARE RISK AT HOME:  Medicare Risk at Home Any stairs in or around the home?: Yes If so, are there any without handrails?: No Home free of loose throw rugs in walkways, pet beds, electrical cords, etc?: Yes Adequate lighting in your home to reduce risk of falls?: Yes Life alert?: No Use of a cane, walker or w/c?: No Grab bars in the bathroom?: No Shower chair or bench in shower?: No Elevated toilet seat or a handicapped toilet?: No  TIMED UP AND GO:  Was the test performed?  No  Cognitive Function: 6CIT completed        05/12/2024    2:10 PM 05/12/2023    2:11 PM 05/08/2020   10:03 AM 05/05/2019    9:58 AM 04/30/2018   10:00 AM  6CIT Screen  What Year? 0 points 0 points 0 points 0 points 0 points  What month? 0 points 0 points 0 points 0 points 3 points  What time? 0 points 0 points 0 points 0 points 0 points  Count back from 20 0 points 0 points 0 points 0 points 0 points  Months in reverse 0 points 0 points 0 points 0 points 0 points  Repeat phrase 2 points 0 points 0 points 0 points 0 points  Total Score 2 points 0 points 0 points 0 points 3 points    Immunizations Immunization History  Administered Date(s) Administered   Fluad Quad(high Dose 65+) 08/09/2022   Influenza Split 08/03/2012   Influenza, High Dose Seasonal PF 09/30/2020, 09/06/2021, 09/30/2023   Influenza,inj,Quad PF,6+ Mos 08/23/2013, 09/05/2014, 09/07/2015, 09/03/2018   Influenza-Unspecified 09/23/2017, 08/26/2019   Moderna Covid-19 Fall Seasonal Vaccine 36yrs & older 08/09/2022, 09/30/2023   PFIZER(Purple Top)SARS-COV-2 Vaccination 01/02/2020,  02/01/2020   PNEUMOCOCCAL CONJUGATE-20 09/06/2021   Pneumococcal Conjugate-13 09/30/2020   Respiratory Syncytial Virus Vaccine,Recomb Aduvanted(Arexvy) 09/29/2022   Tdap 09/07/2015   Zoster Recombinant(Shingrix) 09/30/2023    Screening Tests Health Maintenance  Topic Date Due   Lung Cancer Screening  Never done   Zoster Vaccines- Shingrix (2 of 2) 11/25/2023   COVID-19 Vaccine (5 - 2024-25 season) 03/29/2024   INFLUENZA VACCINE  06/18/2024   MAMMOGRAM  10/13/2024   Medicare Annual Wellness (AWV)  05/12/2025   DEXA SCAN  07/18/2025   Colonoscopy  08/17/2025   DTaP/Tdap/Td (2 - Td or Tdap) 09/06/2025   Pneumococcal Vaccine: 50+ Years  Completed   Hepatitis C Screening  Completed   Hepatitis B Vaccines  Aged Out   HPV VACCINES  Aged Out   Meningococcal B Vaccine  Aged Out    Health Maintenance  Health Maintenance Due  Topic Date Due   Lung Cancer Screening  Never done   Zoster Vaccines- Shingrix (2 of 2) 11/25/2023   COVID-19 Vaccine (5 - 2024-25 season) 03/29/2024   Health Maintenance Items Addressed: UP TO DATE ON MAMMOGRAM, COLONOSCOPY & BONE DENSITY;UP TO DATE ON SHOTS  Additional Screening:  Vision Screening: Recommended annual ophthalmology exams for early detection of glaucoma and  other disorders of the eye. Would you like a referral to an eye doctor? No    Dental Screening: Recommended annual dental exams for proper oral hygiene  Community Resource Referral / Chronic Care Management: CRR required this visit?  No   CCM required this visit?  No   Plan:    I have personally reviewed and noted the following in the patient's chart:   Medical and social history Use of alcohol , tobacco or illicit drugs  Current medications and supplements including opioid prescriptions. Patient is not currently taking opioid prescriptions. Functional ability and status Nutritional status Physical activity Advanced directives List of other physicians Hospitalizations,  surgeries, and ER visits in previous 12 months Vitals Screenings to include cognitive, depression, and falls Referrals and appointments  In addition, I have reviewed and discussed with patient certain preventive protocols, quality metrics, and best practice recommendations. A written personalized care plan for preventive services as well as general preventive health recommendations were provided to patient.   Jhonnie GORMAN Das, LPN   3/74/7974   After Visit Summary: (MyChart) Due to this being a telephonic visit, the after visit summary with patients personalized plan was offered to patient via MyChart   Notes: Nothing significant to report at this time.

## 2024-05-12 NOTE — Patient Instructions (Addendum)
 Alejandra Little , Thank you for taking time out of your busy schedule to complete your Annual Wellness Visit with me. I enjoyed our conversation and look forward to speaking with you again next year. I, as well as your care team,  appreciate your ongoing commitment to your health goals. Please review the following plan we discussed and let me know if I can assist you in the future.    Follow up Visits: Next Medicare AWV with our clinical staff:   05/18/25 @ 1:10 PM BY PHONE Have you seen your provider in the last 6 months (3 months if uncontrolled diabetes)? Yes  Clinician Recommendations:  Aim for 30 minutes of exercise or brisk walking, 6-8 glasses of water, and 5 servings of fruits and vegetables each day. TAKE CARE!      This is a list of the screening recommended for you and due dates:  Health Maintenance  Topic Date Due   Screening for Lung Cancer  Never done   Zoster (Shingles) Vaccine (2 of 2) 11/25/2023   COVID-19 Vaccine (5 - 2024-25 season) 03/29/2024   Flu Shot  06/18/2024   Mammogram  10/13/2024   Medicare Annual Wellness Visit  05/12/2025   DEXA scan (bone density measurement)  07/18/2025   Colon Cancer Screening  08/17/2025   DTaP/Tdap/Td vaccine (2 - Td or Tdap) 09/06/2025   Pneumococcal Vaccine for age over 28  Completed   Hepatitis C Screening  Completed   Hepatitis B Vaccine  Aged Out   HPV Vaccine  Aged Out   Meningitis B Vaccine  Aged Out    Advanced directives: (ACP Link)Information on Advanced Care Planning can be found at [place]  Best boy Advance Health Care Directives Advance Health Care Directives. [email or web address]  Advance Care Planning is important because it:  [x]  Makes sure you receive the medical care that is consistent with your values, goals, and preferences  [x]  It provides guidance to your family and loved ones and reduces their decisional burden about whether or not they are making the right decisions based on your wishes.  Follow the link  provided in your after visit summary or read over the paperwork we have mailed to you to help you started getting your Advance Directives in place. If you need assistance in completing these, please reach out to us  so that we can help you!

## 2024-05-15 NOTE — Progress Notes (Signed)
 I have reviewed the health advisor's note, was available for consultation, and agree with documentation and plan  Mila Merry, MD

## 2024-06-12 ENCOUNTER — Other Ambulatory Visit: Payer: Self-pay | Admitting: Family Medicine

## 2024-06-12 DIAGNOSIS — K219 Gastro-esophageal reflux disease without esophagitis: Secondary | ICD-10-CM

## 2024-06-15 ENCOUNTER — Other Ambulatory Visit: Payer: Self-pay | Admitting: Family Medicine

## 2024-06-15 NOTE — Telephone Encounter (Unsigned)
 Copied from CRM [id number]. Topic: Clinical - Medication Refill >> Jun 15, 2024 12:04 PM Antwanette L wrote: Medication: HYDROcodone -acetaminophen  (NORCO) 7.5-325 MG tablet  Has the patient contacted their pharmacy? No   This is the patient's preferred pharmacy:  [facility] #[id number] [place], [place] - [address] AT [facility] OF [address] [place] [address] Phone: [phone] Fax: [phone]    Is this the correct pharmacy for this prescription? Yes   Has the prescription been filled recently? Yes. Last refill was on 05/11/24  Is the patient out of the medication? No  Has the patient been seen for an appointment in the last year OR does the patient have an upcoming appointment? Yes. Last ov with Dr. Gasper was on  12/10/23 and awv visit was on 05/12/24  Can we respond through MyChart? No. Contact the patient by phone at [phone]  Agent: Please be advised that Rx refills may take up to 3 business days. We ask that you follow-up with your pharmacy.

## 2024-06-16 MED ORDER — HYDROCODONE-ACETAMINOPHEN 7.5-325 MG PO TABS: 1.0000 | ORAL_TABLET | Freq: Four times a day (QID) | ORAL | 0 refills | Status: DC | PRN

## 2024-06-16 NOTE — Telephone Encounter (Signed)
 Requested medication (s) are due for refill today -yes  Requested medication (s) are on the active medication list -yes  Future visit scheduled -yes  Last refill: 05/11/24 #120  Notes to clinic: non delegated Rx  Requested Prescriptions  Pending Prescriptions Disp Refills   HYDROcodone -acetaminophen  (NORCO) 7.5-325 MG tablet 120 tablet 0    Sig: Take 1 tablet by mouth 4 (four) times daily as needed for moderate pain (pain score 4-6).     Not Delegated - Analgesics:  Opioid Agonist Combinations Failed - 06/16/2024  1:07 PM      Failed - This refill cannot be delegated      Failed - Urine Drug Screen completed in last 360 days      Passed - Valid encounter within last 3 months    Recent Outpatient Visits   None               Requested Prescriptions  Pending Prescriptions Disp Refills   HYDROcodone -acetaminophen  (NORCO) 7.5-325 MG tablet 120 tablet 0    Sig: Take 1 tablet by mouth 4 (four) times daily as needed for moderate pain (pain score 4-6).     Not Delegated - Analgesics:  Opioid Agonist Combinations Failed - 06/16/2024  1:07 PM      Failed - This refill cannot be delegated      Failed - Urine Drug Screen completed in last 360 days      Passed - Valid encounter within last 3 months    Recent Outpatient Visits   None

## 2024-07-01 ENCOUNTER — Other Ambulatory Visit: Payer: Self-pay | Admitting: Family Medicine

## 2024-07-01 DIAGNOSIS — E785 Hyperlipidemia, unspecified: Secondary | ICD-10-CM

## 2024-07-05 ENCOUNTER — Ambulatory Visit: Admitting: Family Medicine

## 2024-07-05 ENCOUNTER — Telehealth (INDEPENDENT_AMBULATORY_CARE_PROVIDER_SITE_OTHER): Payer: Medicare (Managed Care) | Admitting: Family Medicine

## 2024-07-05 DIAGNOSIS — E7800 Pure hypercholesterolemia, unspecified: Secondary | ICD-10-CM

## 2024-07-05 DIAGNOSIS — M51379 Other intervertebral disc degeneration, lumbosacral region without mention of lumbar back pain or lower extremity pain: Secondary | ICD-10-CM | POA: Diagnosis not present

## 2024-07-05 DIAGNOSIS — E538 Deficiency of other specified B group vitamins: Secondary | ICD-10-CM

## 2024-07-05 DIAGNOSIS — M5441 Lumbago with sciatica, right side: Secondary | ICD-10-CM

## 2024-07-05 DIAGNOSIS — F419 Anxiety disorder, unspecified: Secondary | ICD-10-CM | POA: Diagnosis not present

## 2024-07-05 DIAGNOSIS — I10 Essential (primary) hypertension: Secondary | ICD-10-CM

## 2024-07-05 DIAGNOSIS — F0631 Mood disorder due to known physiological condition with depressive features: Secondary | ICD-10-CM | POA: Diagnosis not present

## 2024-07-05 DIAGNOSIS — G8929 Other chronic pain: Secondary | ICD-10-CM

## 2024-07-05 MED ORDER — BUSPIRONE HCL 10 MG PO TABS: ORAL_TABLET | ORAL | 1 refills | Status: AC

## 2024-07-17 NOTE — Progress Notes (Signed)
 MyChart Video Visit    Virtual Visit via Video Note   This format is felt to be most appropriate for this patient at this time. Physical exam was limited by quality of the video and audio technology used for the visit.   Patient location: home Provider location: [facility]  I discussed the limitations of evaluation and management by telemedicine and the availability of in person appointments. The patient expressed understanding and agreed to proceed.  Patient: Alejandra Little   DOB: 19-Apr-1956   68 y.o. Female  MRN: [id number] Visit Date: 07/05/2024  Today's healthcare provider: Nancyann Perry, MD    Subjective    HPI  Discussed the use of AI scribe software for clinical note transcription with the patient, who gave verbal consent to proceed.  History of Present Illness   Alejandra Little is a 68 year old female who presents for follow-up on pain medication and management of anxiety and sleep issues.  She is here for a follow-up regarding her pain medication, specifically hydrocodone . She is also experiencing significant anxiety and sleep disturbances, which she attributes to her current use of alprazolam . She takes alprazolam  only at night, a couple of hours before bed, usually one tablet, but sometimes two. Despite this, she continues to have trouble sleeping and feels tired in the mornings, often not waking up until 10 or 11 AM due to being awake half the night.  Her anxiety is described as 'terrible' and affects her during the day, causing restlessness and a feeling of being 'eat up inside'. She does not take any other medications for anxiety during the day. She is currently taking fluoxetine , three tablets at one time daily, and has not taken buspirone  before. She believes she has a refill of alprazolam  available at the pharmacy and is managing her medications through [facility].  She reports feeling tired in the mornings and experiences restlessness during the day. No other medications  for anxiety are taken during the daytime.       Medications: Outpatient Medications Prior to Visit  Medication Sig   ALPRAZolam  (XANAX ) 0.5 MG tablet Take 1-2 tablets (0.5-1 mg total) by mouth at bedtime.   cyanocobalamin  (VITAMIN B12) 1000 MCG tablet Take 1,000 mcg by mouth daily.   diphenoxylate -atropine  (LOMOTIL ) 2.5-0.025 MG tablet Take 1-2 tablets by mouth 2 (two) times daily as needed for diarrhea or loose stools.   esomeprazole  (NEXIUM ) 40 MG capsule TAKE 1 CAPSULE BY MOUTH EVERY DAY   FLUoxetine  (PROZAC ) 20 MG capsule Take 3 capsules (60 mg total) by mouth daily.   gabapentin  (NEURONTIN ) 300 MG capsule TAKE 1 CAPSULE (300MG ) BY MOUTH THREE TIMES DAILY AND TAKE 2 CAPSULES (600MG ) BY MOUTH AT BEDTIME   HYDROcodone -acetaminophen  (NORCO) 7.5-325 MG tablet Take 1 tablet by mouth 4 (four) times daily as needed for moderate pain (pain score 4-6).   meclizine  (ANTIVERT ) 25 MG tablet Take 1 tablet (25 mg total) by mouth 3 (three) times daily as needed for dizziness.   meclizine  (ANTIVERT ) 25 MG tablet Take 1 tablet (25 mg total) by mouth 3 (three) times daily as needed for dizziness.   meloxicam  (MOBIC ) 7.5 MG tablet TAKE 1 TABLET BY MOUTH EVERY DAY   pravastatin  (PRAVACHOL ) 40 MG tablet TAKE 1 TABLET BY MOUTH EVERY DAY AT BEDTIME   Semaglutide -Weight Management (WEGOVY ) 0.25 MG/0.5ML SOAJ Inject 0.25mg  once a week for 4 weeks, then increase to 0.5mg  (Patient not taking: Reported on 05/12/2024)   Specialty Vitamins Products ([employer] HAIR/SKIN/NAILS) TABS Take 1  tablet by mouth daily.    No facility-administered medications prior to visit.      Objective    There were no vitals taken for this visit.   Physical Exam  Awake, alert, oriented x 3. In no apparent distress   Assessment & Plan        Anxiety disorder with insomnia Persistent anxiety and insomnia despite current treatment. - Prescribe buspirone  for daytime anxiety. - Continue alprazolam  at night as needed. - Continue  fluoxetine  as prescribed.  Depressive disorder Continue fluoxetine  as prescribed.  Chronic pain due to lumbar DDD Ongoing management with hydrocodone .  Follow-up Follow-up needed to evaluate medication effectiveness. - Schedule follow-up in one month to assess buspirone  response. - Arrange blood work at next visit.             I discussed the assessment and treatment plan with the patient. The patient was provided an opportunity to ask questions and all were answered. The patient agreed with the plan and demonstrated an understanding of the instructions.   The patient was advised to call back or seek an in-person evaluation if the symptoms worsen or if the condition fails to improve as anticipated.  I provided 10 minutes of non-face-to-face time during this encounter.   Nancyann Perry, MD [facility] [phone] (phone) [phone] (fax)  [facility]

## 2024-07-20 ENCOUNTER — Other Ambulatory Visit: Payer: Self-pay | Admitting: Family Medicine

## 2024-07-20 DIAGNOSIS — Z961 Presence of intraocular lens: Secondary | ICD-10-CM | POA: Diagnosis not present

## 2024-07-20 DIAGNOSIS — H04123 Dry eye syndrome of bilateral lacrimal glands: Secondary | ICD-10-CM | POA: Diagnosis not present

## 2024-07-20 NOTE — Telephone Encounter (Signed)
 Copied from CRM #[id number]. Topic: Clinical - Medication Refill >> Jul 20, 2024 11:29 AM Turkey B wrote: Medication: HYDROcodone -acetaminophen  (NORCO) 7.5-325 MG tablet  Has the patient contacted their pharmacy? no  This is the patient's preferred pharmacy:  [facility] #[id number] [place], [place] - [address] AT [facility] OF [address] [place] [address] Phone: [phone] Fax: [phone]   Is this the correct pharmacy for this prescription? yes  Has the prescription been filled recently? no  Is the patient out of the medication? no  Has the patient been seen for an appointment in the last year OR does the patient have an upcoming appointment? yes  Can we respond through MyChart? yes  Agent: Please be advised that Rx refills may take up to 3 business days. We ask that you follow-up with your pharmacy.

## 2024-07-20 NOTE — Telephone Encounter (Signed)
 Requested medications are due for refill today.  yes  Requested medications are on the active medications list.  yes  Last refill. 06/16/2024 #120 0 rf  Future visit scheduled.   yes  Notes to clinic.  Refill not delegated.    Requested Prescriptions  Pending Prescriptions Disp Refills   HYDROcodone -acetaminophen  (NORCO) 7.5-325 MG tablet 120 tablet 0    Sig: Take 1 tablet by mouth 4 (four) times daily as needed for moderate pain (pain score 4-6).     Not Delegated - Analgesics:  Opioid Agonist Combinations Failed - 07/20/2024  5:57 PM      Failed - This refill cannot be delegated      Failed - Urine Drug Screen completed in last 360 days      Passed - Valid encounter within last 3 months    Recent Outpatient Visits           2 weeks ago Anxiety   [facility] Gasper Nancyann BRAVO, MD

## 2024-07-21 MED ORDER — HYDROCODONE-ACETAMINOPHEN 7.5-325 MG PO TABS: 1.0000 | ORAL_TABLET | Freq: Four times a day (QID) | ORAL | 0 refills | Status: DC | PRN

## 2024-07-30 DIAGNOSIS — M7541 Impingement syndrome of right shoulder: Secondary | ICD-10-CM | POA: Diagnosis not present

## 2024-08-09 ENCOUNTER — Ambulatory Visit: Admitting: Family Medicine

## 2024-08-18 DIAGNOSIS — M7541 Impingement syndrome of right shoulder: Secondary | ICD-10-CM | POA: Diagnosis not present

## 2024-08-23 ENCOUNTER — Ambulatory Visit: Payer: Medicare (Managed Care) | Admitting: Family Medicine

## 2024-08-23 ENCOUNTER — Encounter: Payer: Self-pay | Admitting: Family Medicine

## 2024-08-23 VITALS — BP 116/80 | HR 69 | Ht 64.0 in | Wt 208.5 lb

## 2024-08-23 DIAGNOSIS — E669 Obesity, unspecified: Secondary | ICD-10-CM

## 2024-08-23 DIAGNOSIS — F419 Anxiety disorder, unspecified: Secondary | ICD-10-CM | POA: Diagnosis not present

## 2024-08-23 DIAGNOSIS — Z23 Encounter for immunization: Secondary | ICD-10-CM

## 2024-08-23 DIAGNOSIS — S46011A Strain of muscle(s) and tendon(s) of the rotator cuff of right shoulder, initial encounter: Secondary | ICD-10-CM | POA: Diagnosis not present

## 2024-08-23 MED ORDER — HYDROCODONE-ACETAMINOPHEN 7.5-325 MG PO TABS: 1.0000 | ORAL_TABLET | Freq: Four times a day (QID) | ORAL | 0 refills | Status: DC | PRN

## 2024-08-23 MED ORDER — ALPRAZOLAM 0.5 MG PO TABS: 0.5000 mg | ORAL_TABLET | Freq: Every day | ORAL | 5 refills | Status: DC

## 2024-08-23 NOTE — Progress Notes (Signed)
 Established patient visit   Patient: Alejandra Little   DOB: September 07, 1956   68 y.o. Female  MRN: [id number] Visit Date: 08/23/2024  Today's healthcare provider: Nancyann Perry, MD   Chief Complaint  Patient presents with   Follow-up    Anxiety and depressive disorder   Subjective    Discussed the use of AI scribe software for clinical note transcription with the patient, who gave verbal consent to proceed.  History of Present Illness   Alejandra Little is a 68 year old female who presents for medication follow-up.  She has been experiencing anxiety with a slight improvement in frequency and duration of episodes.  She was prescribed buspirone  1 month ago but she never started it due to concerns about addiction and confusion regarding dosage instructions. She continues to take fluoxetine  with her sister's assistance in picking up the medication. She takes alprazolam  every night before bed but finds it less effective than before. She is cautious about increasing the dose due to addiction concerns and inquires about taking an additional dose on particularly stressful days.  She has been receiving cortisone injections for chronic shoulder pain, with the most recent injection administered today.  She expresses concern about her weight, noting fluctuations between her current weight and 204 pounds. She is interested in a new liquid medication for weight loss but is wary due to adverse effects experienced by family members.  She inquires about obtaining certification for her dog as an emotional support animal, noting the dog's significant positive impact on her well-being.       Medications: Outpatient Medications Prior to Visit  Medication Sig   cyanocobalamin  (VITAMIN B12) 1000 MCG tablet Take 1,000 mcg by mouth daily.   esomeprazole  (NEXIUM ) 40 MG capsule TAKE 1 CAPSULE BY MOUTH EVERY DAY   FLUoxetine  (PROZAC ) 20 MG capsule Take 3 capsules (60 mg total) by mouth daily.   meclizine   (ANTIVERT ) 25 MG tablet Take 1 tablet (25 mg total) by mouth 3 (three) times daily as needed for dizziness.   meloxicam  (MOBIC ) 7.5 MG tablet TAKE 1 TABLET BY MOUTH EVERY DAY   pravastatin  (PRAVACHOL ) 40 MG tablet TAKE 1 TABLET BY MOUTH EVERY DAY AT BEDTIME   Specialty Vitamins Products ([employer] HAIR/SKIN/NAILS) TABS Take 1 tablet by mouth daily.    ALPRAZolam  (XANAX ) 0.5 MG tablet Take 1-2 tablets (0.5-1 mg total) by mouth at bedtime.   busPIRone  (BUSPAR ) 10 MG tablet Take 1/2 every morning for 4 days, then 1/2 tablet twice a day for 4 days, then 1 in the morning and 1/2 in the evening for 4 days, then 1 tablet twice a day. (Patient not taking: Reported on 08/23/2024)   HYDROcodone -acetaminophen  (NORCO) 7.5-325 MG tablet Take 1 tablet by mouth 4 (four) times daily as needed for moderate pain (pain score 4-6). (Patient not taking: Reported on 08/23/2024)   No facility-administered medications prior to visit.   Review of Systems  Constitutional:  Negative for appetite change, chills, fatigue and fever.  Respiratory:  Positive for wheezing. Negative for chest tightness and shortness of breath.   Cardiovascular:  Positive for chest pain. Negative for palpitations.  Gastrointestinal:  Negative for abdominal pain, nausea and vomiting.  Neurological:  Negative for dizziness and weakness.       Objective    BP 116/80 (BP Location: Left Arm, Patient Position: Sitting, Cuff Size: Normal)   Pulse 69   Ht 5' 4 (1.626 m)   Wt 208 lb 8 oz (94.6 kg)  SpO2 96%   BMI 35.79 kg/m   Physical Exam   General: Appearance:    Obese female in no acute distress  Eyes:    PERRL, conjunctiva/corneas clear, EOM's intact       Lungs:     Clear to auscultation bilaterally, respirations unlabored  Heart:    Normal heart rate. Normal rhythm. No murmurs, rubs, or gallops.    MS:   All extremities are intact.    Neurologic:   Awake, alert, oriented x 3. No apparent focal neurological defect.        Assessment & Plan        Anxiety disorder Anxiety symptoms reduced. Concerns about buspirone  addiction and administration. Alprazolam  less effective, cautious about dosage increase. - Educated about buspirone 's non-addictive nature, advised low starting dose to avoid dizziness. - Continue fluoxetine  as prescribed. - Prescribe alprazolam  with option for additional dose on high stress days, not daily. - Call in alprazolam  prescription to [facility].  Chronic shoulder pain Chronic shoulder pain managed with cortisone injections. Recent injection today.  Obesity Obesity with difficulty losing weight. Concerns about new liquid weight loss medication and side effects. - Research new liquid weight loss medication and side effects.  General Health Maintenance Due for flu shot and COVID booster. Cortisone injection may affect flu shot efficacy. - Advise waiting two weeks post-cortisone injection for flu shot and COVID booster. - Instruct to get flu shot and COVID booster at pharmacy in two weeks.  Follow-Up Follow-up needed for routine blood work and potential colonoscopy. - Schedule follow-up appointment in February for blood work. - Plan for colonoscopy at end of 2026.     Return in about 4 months (around 12/24/2024).      Nancyann Perry, MD  [facility] [phone] (phone) [phone] (fax)  [facility]

## 2024-08-23 NOTE — Patient Instructions (Signed)
 SABRA  Please review the attached list of medications and notify my office if there are any errors.   . Please bring all of your medications to every appointment so we can make sure that our medication list is the same as yours.

## 2024-09-27 ENCOUNTER — Other Ambulatory Visit: Payer: Self-pay | Admitting: Family Medicine

## 2024-09-27 DIAGNOSIS — F419 Anxiety disorder, unspecified: Secondary | ICD-10-CM

## 2024-09-27 NOTE — Telephone Encounter (Unsigned)
 Copied from CRM #[id number]. Topic: Clinical - Medication Refill >> Sep 27, 2024 10:08 AM Nathanel BROCKS wrote: Medication: ALPRAZolam  (XANAX ) 0.5 MG tablet HYDROcodone -acetaminophen  (NORCO) 7.5-325 MG tablet  Has the patient contacted their pharmacy? No  This is the patient's preferred pharmacy:  [facility] #[id number] [place], [place] - [address] AT [facility] OF [address] [place] [address] Phone: [phone] Fax: [phone]  Is this the correct pharmacy for this prescription? Yes If no, delete pharmacy and type the correct one.   Has the prescription been filled recently? Yes  Is the patient out of the medication? Yes  Has the patient been seen for an appointment in the last year OR does the patient have an upcoming appointment? Yes  Can we respond through MyChart? No  Agent: Please be advised that Rx refills may take up to 3 business days. We ask that you follow-up with your pharmacy.

## 2024-09-28 NOTE — Telephone Encounter (Signed)
 Requested medications are due for refill today.  Norco is, Xanax  is not  Requested medications are on the active medications list.  yes  Last refill. Norco 08/23/2024 #120 0 rf, Xanax  08/23/2024 #45 5 rf  Future visit scheduled.   yes  Notes to clinic.  Refill/refusal not delegated.    Requested Prescriptions  Pending Prescriptions Disp Refills   ALPRAZolam  (XANAX ) 0.5 MG tablet 45 tablet 5    Sig: Take 1-2 tablets (0.5-1 mg total) by mouth at bedtime.     Not Delegated - Psychiatry: Anxiolytics/Hypnotics 2 Failed - 09/28/2024  4:37 PM      Failed - This refill cannot be delegated      Failed - Urine Drug Screen completed in last 360 days      Passed - Patient is not pregnant      Passed - Valid encounter within last 6 months    Recent Outpatient Visits           1 month ago Immunization due   [facility] Gasper Nancyann BRAVO, MD   2 months ago Anxiety   [facility] Gasper Nancyann BRAVO, MD               HYDROcodone -acetaminophen  (NORCO) 7.5-325 MG tablet 120 tablet 0    Sig: Take 1 tablet by mouth 4 (four) times daily as needed for moderate pain (pain score 4-6).     Not Delegated - Analgesics:  Opioid Agonist Combinations Failed - 09/28/2024  4:37 PM      Failed - This refill cannot be delegated      Failed - Urine Drug Screen completed in last 360 days      Passed - Valid encounter within last 3 months    Recent Outpatient Visits           1 month ago Immunization due   [facility] Gas[facility] Nancyann BRAVO, MD   2 months ago Anxiety   [facility] Gasper Nancyann BRAVO, MD

## 2024-09-30 MED ORDER — ALPRAZOLAM 0.5 MG PO TABS: 0.5000 mg | ORAL_TABLET | Freq: Every day | ORAL | 5 refills | Status: DC

## 2024-09-30 MED ORDER — HYDROCODONE-ACETAMINOPHEN 7.5-325 MG PO TABS: 1.0000 | ORAL_TABLET | Freq: Four times a day (QID) | ORAL | 0 refills | Status: DC | PRN

## 2024-10-29 ENCOUNTER — Other Ambulatory Visit: Payer: Self-pay | Admitting: Family Medicine

## 2024-10-29 DIAGNOSIS — F419 Anxiety disorder, unspecified: Secondary | ICD-10-CM

## 2024-10-29 NOTE — Telephone Encounter (Unsigned)
 Copied from CRM #[id number]. Topic: Clinical - Medication Refill >> Oct 29, 2024 10:33 AM Hadassah PARAS wrote: Medication: ALPRAZolam  (XANAX ) 0.5 MG tablet; HYDROcodone -acetaminophen  (NORCO) 7.5-325 MG tablet   Has the patient contacted their pharmacy? No (Agent: If no, request that the patient contact the pharmacy for the refill. If patient does not wish to contact the pharmacy document the reason why and proceed with request.) (Agent: If yes, when and what did the pharmacy advise?)  This is the patient's preferred pharmacy:  [facility] #[id number] [place], [place] - [address] AT [facility] OF [address] [place] [address] Phone: [phone] Fax: [phone]   Is this the correct pharmacy for this prescription? Yes If no, delete pharmacy and type the correct one.   Has the prescription been filled recently? Yes  Is the patient out of the medication? No  Has the patient been seen for an appointment in the last year OR does the patient have an upcoming appointment? Yes  Can we respond through MyChart? Yes  Agent: Please be advised that Rx refills may take up to 3 business days. We ask that you follow-up with your pharmacy.

## 2024-11-01 NOTE — Telephone Encounter (Signed)
 Requested medications are due for refill today.  One is the other is not  Requested medications are on the active medications list.  Yes  Last refill. 09/30/2024 for both. Xanax  #34 5 rf. Hydrocodone  #120 0 rf  Future visit scheduled.   yes  Notes to clinic.  Refill/refusal not delegated.    Requested Prescriptions  Pending Prescriptions Disp Refills   ALPRAZolam  (XANAX ) 0.5 MG tablet 45 tablet 5    Sig: Take 1-2 tablets (0.5-1 mg total) by mouth at bedtime.     Not Delegated - Psychiatry: Anxiolytics/Hypnotics 2 Failed - 11/01/2024  2:59 PM      Failed - This refill cannot be delegated      Failed - Urine Drug Screen completed in last 360 days      Passed - Patient is not pregnant      Passed - Valid encounter within last 6 months    Recent Outpatient Visits           2 months ago Immunization due   [facility] Gas[facility] Nancyann BRAVO, MD   3 months ago Anxiety   [place] [facility] Gasper Nancyann BRAVO, MD               HYDROcodone -acetaminophen  (NORCO) 7.5-325 MG tablet 120 tablet 0    Sig: Take 1 tablet by mouth 4 (four) times daily as needed for moderate pain (pain score 4-6).     Not Delegated - Analgesics:  Opioid Agonist Combinations Failed - 11/01/2024  2:59 PM      Failed - This refill cannot be delegated      Failed - Urine Drug Screen completed in last 360 days      Passed - Valid encounter within last 3 months    Recent Outpatient Visits           2 months ago Immunization due   [facility] Gasper, Nancyann BRAVO, MD   3 months ago Anxiety   [facility] Gasper Nancyann BRAVO, MD

## 2024-11-02 MED ORDER — HYDROCODONE-ACETAMINOPHEN 7.5-325 MG PO TABS: 1.0000 | ORAL_TABLET | Freq: Four times a day (QID) | ORAL | 0 refills | Status: DC | PRN

## 2024-11-02 MED ORDER — ALPRAZOLAM 0.5 MG PO TABS: 0.5000 mg | ORAL_TABLET | Freq: Every day | ORAL | 5 refills | Status: DC

## 2024-12-01 ENCOUNTER — Other Ambulatory Visit: Payer: Self-pay | Admitting: Family Medicine

## 2024-12-01 DIAGNOSIS — F419 Anxiety disorder, unspecified: Secondary | ICD-10-CM

## 2024-12-01 NOTE — Telephone Encounter (Unsigned)
 Copied from CRM [id number]. Topic: Clinical - Medication Refill >> Dec 01, 2024  1:08 PM Tiffini S wrote: Medication: HYDROcodone -acetaminophen  (NORCO) 7.5-325 MG tablet,  ALPRAZolam  (XANAX ) 0.5 MG tablet    Has the patient contacted their pharmacy? No (Agent: If no, request that the patient contact the pharmacy for the refill. If patient does not wish to contact the pharmacy document the reason why and proceed with request.) (Agent: If yes, when and what did the pharmacy advise?)  This is the patient's preferred pharmacy:  [facility]ORE #[id number] [place], [place] - [address] AT [facility] OF [address] [place] [address] Phone: [phone] Fax: [phone]    Is this the correct pharmacy for this prescription? Yes If no, delete pharmacy and type the correct one.   Has the prescription been filled recently? Yes  Is the patient out of the medication? No  Has the patient been seen for an appointment in the last year OR does the patient have an upcoming appointment? Yes  Can we respond through MyChart? No pleas call [phone]  Agent: Please be advised that Rx refills may take up to 3 business days. We ask that you follow-up with your pharmacy.

## 2024-12-02 NOTE — Telephone Encounter (Signed)
 Requested medication (s) are due for refill today: Yes  Requested medication (s) are on the active medication list: Yes  Last refill:  11/02/24  Future visit scheduled: Yes  Notes to clinic:  Not delegated.    Requested Prescriptions  Pending Prescriptions Disp Refills   HYDROcodone -acetaminophen  (NORCO) 7.5-325 MG tablet 120 tablet 0    Sig: Take 1 tablet by mouth 4 (four) times daily as needed for moderate pain (pain score 4-6).     Not Delegated - Analgesics:  Opioid Agonist Combinations Failed - 12/02/2024 12:20 PM      Failed - This refill cannot be delegated      Failed - Urine Drug Screen completed in last 360 days      Failed - Valid encounter within last 3 months    Recent Outpatient Visits           3 months ago Immunization due   [facility] Gas[facility] Nancyann BRAVO, MD   5 months ago Anxiety   [place] [facility] Gas[facility] Nancyann BRAVO, MD               ALPRAZolam  (XANAX ) 0.5 MG tablet 45 tablet 5    Sig: Take 1-2 tablets (0.5-1 mg total) by mouth at bedtime.     Not Delegated - Psychiatry: Anxiolytics/Hypnotics 2 Failed - 12/02/2024 12:20 PM      Failed - This refill cannot be delegated      Failed - Urine Drug Screen completed in last 360 days      Passed - Patient is not pregnant      Passed - Valid encounter within last 6 months    Recent Outpatient Visits           3 months ago Immunization due   [facility] Gasper, Nancyann BRAVO, MD   5 months ago Anxiety   [facility] Gasper Nancyann BRAVO, MD

## 2024-12-03 MED ORDER — HYDROCODONE-ACETAMINOPHEN 7.5-325 MG PO TABS: 1.0000 | ORAL_TABLET | Freq: Four times a day (QID) | ORAL | 0 refills | Status: AC | PRN

## 2024-12-03 MED ORDER — ALPRAZOLAM 0.5 MG PO TABS: 0.5000 mg | ORAL_TABLET | Freq: Every day | ORAL | 3 refills | Status: AC

## 2024-12-27 ENCOUNTER — Ambulatory Visit: Payer: Medicare (Managed Care) | Admitting: Physician Assistant

## 2024-12-27 ENCOUNTER — Encounter: Payer: Medicare (Managed Care) | Admitting: Family Medicine

## 2024-12-27 DIAGNOSIS — F419 Anxiety disorder, unspecified: Secondary | ICD-10-CM

## 2024-12-27 DIAGNOSIS — R251 Tremor, unspecified: Secondary | ICD-10-CM

## 2024-12-27 DIAGNOSIS — R748 Abnormal levels of other serum enzymes: Secondary | ICD-10-CM

## 2024-12-27 DIAGNOSIS — M51379 Other intervertebral disc degeneration, lumbosacral region without mention of lumbar back pain or lower extremity pain: Secondary | ICD-10-CM

## 2024-12-27 DIAGNOSIS — E538 Deficiency of other specified B group vitamins: Secondary | ICD-10-CM

## 2024-12-27 DIAGNOSIS — L920 Granuloma annulare: Secondary | ICD-10-CM

## 2024-12-27 DIAGNOSIS — F5104 Psychophysiologic insomnia: Secondary | ICD-10-CM

## 2024-12-27 DIAGNOSIS — Z23 Encounter for immunization: Secondary | ICD-10-CM

## 2024-12-27 DIAGNOSIS — Z6834 Body mass index (BMI) 34.0-34.9, adult: Secondary | ICD-10-CM

## 2024-12-27 DIAGNOSIS — F0631 Mood disorder due to known physiological condition with depressive features: Secondary | ICD-10-CM

## 2024-12-27 DIAGNOSIS — E7800 Pure hypercholesterolemia, unspecified: Secondary | ICD-10-CM

## 2024-12-27 DIAGNOSIS — R5383 Other fatigue: Secondary | ICD-10-CM

## 2024-12-27 DIAGNOSIS — K219 Gastro-esophageal reflux disease without esophagitis: Secondary | ICD-10-CM

## 2024-12-27 DIAGNOSIS — G4733 Obstructive sleep apnea (adult) (pediatric): Secondary | ICD-10-CM

## 2024-12-27 DIAGNOSIS — R7689 Other specified abnormal immunological findings in serum: Secondary | ICD-10-CM

## 2024-12-27 DIAGNOSIS — I10 Essential (primary) hypertension: Secondary | ICD-10-CM

## 2024-12-27 DIAGNOSIS — F17210 Nicotine dependence, cigarettes, uncomplicated: Secondary | ICD-10-CM

## 2024-12-27 DIAGNOSIS — Z1231 Encounter for screening mammogram for malignant neoplasm of breast: Secondary | ICD-10-CM

## 2025-05-18 ENCOUNTER — Ambulatory Visit
